# Patient Record
Sex: Female | Born: 1935 | Race: White | Hispanic: No | Smoking: Former smoker
Health system: Southern US, Community
[De-identification: ages and names within clinical notes are randomized; demographics above are authoritative.]

## PROBLEM LIST (undated history)

## (undated) DIAGNOSIS — F410 Panic disorder [episodic paroxysmal anxiety] without agoraphobia: Secondary | ICD-10-CM

## (undated) DIAGNOSIS — G2581 Restless legs syndrome: Secondary | ICD-10-CM

## (undated) DIAGNOSIS — E079 Disorder of thyroid, unspecified: Secondary | ICD-10-CM

## (undated) DIAGNOSIS — I10 Essential (primary) hypertension: Secondary | ICD-10-CM

## (undated) DIAGNOSIS — I2510 Atherosclerotic heart disease of native coronary artery without angina pectoris: Secondary | ICD-10-CM

## (undated) DIAGNOSIS — E039 Hypothyroidism, unspecified: Secondary | ICD-10-CM

## (undated) DIAGNOSIS — E559 Vitamin D deficiency, unspecified: Secondary | ICD-10-CM

## (undated) HISTORY — DX: Hypothyroidism, unspecified: E03.9

## (undated) HISTORY — DX: Vitamin D deficiency, unspecified: E55.9

## (undated) HISTORY — DX: Restless legs syndrome: G25.81

## (undated) HISTORY — PX: JOINT REPLACEMENT: SHX530

---

## 1999-06-24 ENCOUNTER — Other Ambulatory Visit: Admission: RE | Admit: 1999-06-24 | Discharge: 1999-06-24 | Payer: Self-pay | Admitting: Obstetrics and Gynecology

## 1999-07-01 ENCOUNTER — Ambulatory Visit (HOSPITAL_COMMUNITY): Admission: RE | Admit: 1999-07-01 | Discharge: 1999-07-01 | Payer: Self-pay | Admitting: Obstetrics and Gynecology

## 1999-07-01 ENCOUNTER — Encounter: Payer: Self-pay | Admitting: Obstetrics and Gynecology

## 2000-05-24 ENCOUNTER — Observation Stay (HOSPITAL_COMMUNITY): Admission: RE | Admit: 2000-05-24 | Discharge: 2000-05-25 | Payer: Self-pay | Admitting: Urology

## 2000-07-18 ENCOUNTER — Encounter: Admission: RE | Admit: 2000-07-18 | Discharge: 2000-07-18 | Payer: Self-pay | Admitting: Orthopaedic Surgery

## 2000-08-02 ENCOUNTER — Encounter: Admission: RE | Admit: 2000-08-02 | Discharge: 2000-08-02 | Payer: Self-pay | Admitting: Orthopaedic Surgery

## 2000-08-13 ENCOUNTER — Emergency Department (HOSPITAL_COMMUNITY): Admission: EM | Admit: 2000-08-13 | Discharge: 2000-08-13 | Payer: Self-pay | Admitting: Emergency Medicine

## 2000-08-22 ENCOUNTER — Encounter: Admission: RE | Admit: 2000-08-22 | Discharge: 2000-08-22 | Payer: Self-pay | Admitting: Orthopaedic Surgery

## 2000-10-08 ENCOUNTER — Other Ambulatory Visit: Admission: RE | Admit: 2000-10-08 | Discharge: 2000-10-08 | Payer: Self-pay | Admitting: Obstetrics and Gynecology

## 2000-10-18 ENCOUNTER — Encounter: Admission: RE | Admit: 2000-10-18 | Discharge: 2000-10-18 | Payer: Self-pay | Admitting: Obstetrics and Gynecology

## 2000-10-18 ENCOUNTER — Encounter: Payer: Self-pay | Admitting: Obstetrics and Gynecology

## 2001-07-19 ENCOUNTER — Emergency Department (HOSPITAL_COMMUNITY): Admission: EM | Admit: 2001-07-19 | Discharge: 2001-07-19 | Payer: Self-pay | Admitting: Emergency Medicine

## 2001-07-19 ENCOUNTER — Encounter: Payer: Self-pay | Admitting: Emergency Medicine

## 2001-07-30 ENCOUNTER — Ambulatory Visit (HOSPITAL_COMMUNITY): Admission: RE | Admit: 2001-07-30 | Discharge: 2001-07-30 | Payer: Self-pay | Admitting: Internal Medicine

## 2001-09-04 ENCOUNTER — Inpatient Hospital Stay (HOSPITAL_COMMUNITY): Admission: RE | Admit: 2001-09-04 | Discharge: 2001-09-09 | Payer: Self-pay | Admitting: Orthopaedic Surgery

## 2002-03-04 ENCOUNTER — Other Ambulatory Visit: Admission: RE | Admit: 2002-03-04 | Discharge: 2002-03-04 | Payer: Self-pay | Admitting: Obstetrics and Gynecology

## 2003-04-08 ENCOUNTER — Ambulatory Visit (HOSPITAL_COMMUNITY): Admission: RE | Admit: 2003-04-08 | Discharge: 2003-04-08 | Payer: Self-pay | Admitting: Gastroenterology

## 2003-07-10 ENCOUNTER — Inpatient Hospital Stay (HOSPITAL_COMMUNITY): Admission: RE | Admit: 2003-07-10 | Discharge: 2003-07-12 | Payer: Self-pay | Admitting: Orthopaedic Surgery

## 2003-08-10 ENCOUNTER — Encounter: Admission: RE | Admit: 2003-08-10 | Discharge: 2003-09-01 | Payer: Self-pay | Admitting: *Deleted

## 2003-09-28 ENCOUNTER — Encounter: Admission: RE | Admit: 2003-09-28 | Discharge: 2003-09-28 | Payer: Self-pay | Admitting: Orthopaedic Surgery

## 2003-10-12 ENCOUNTER — Encounter: Admission: RE | Admit: 2003-10-12 | Discharge: 2003-10-12 | Payer: Self-pay | Admitting: Orthopaedic Surgery

## 2003-10-27 ENCOUNTER — Encounter: Admission: RE | Admit: 2003-10-27 | Discharge: 2003-10-27 | Payer: Self-pay | Admitting: Orthopaedic Surgery

## 2004-01-11 ENCOUNTER — Inpatient Hospital Stay (HOSPITAL_COMMUNITY): Admission: AD | Admit: 2004-01-11 | Discharge: 2004-01-15 | Payer: Self-pay | Admitting: Cardiology

## 2004-01-22 ENCOUNTER — Other Ambulatory Visit: Admission: RE | Admit: 2004-01-22 | Discharge: 2004-01-22 | Payer: Self-pay | Admitting: Obstetrics and Gynecology

## 2004-06-20 ENCOUNTER — Ambulatory Visit (HOSPITAL_COMMUNITY): Admission: RE | Admit: 2004-06-20 | Discharge: 2004-06-21 | Payer: Self-pay | Admitting: Orthopedic Surgery

## 2004-07-08 ENCOUNTER — Ambulatory Visit: Payer: Self-pay

## 2005-04-09 ENCOUNTER — Emergency Department (HOSPITAL_COMMUNITY): Admission: EM | Admit: 2005-04-09 | Discharge: 2005-04-09 | Payer: Self-pay | Admitting: Emergency Medicine

## 2006-04-30 ENCOUNTER — Encounter: Admission: RE | Admit: 2006-04-30 | Discharge: 2006-04-30 | Payer: Self-pay | Admitting: Orthopedic Surgery

## 2006-05-01 ENCOUNTER — Ambulatory Visit (HOSPITAL_BASED_OUTPATIENT_CLINIC_OR_DEPARTMENT_OTHER): Admission: RE | Admit: 2006-05-01 | Discharge: 2006-05-01 | Payer: Self-pay | Admitting: Orthopedic Surgery

## 2006-08-07 ENCOUNTER — Encounter: Admission: RE | Admit: 2006-08-07 | Discharge: 2006-08-07 | Payer: Self-pay | Admitting: Internal Medicine

## 2006-11-18 ENCOUNTER — Emergency Department: Payer: Self-pay | Admitting: Unknown Physician Specialty

## 2007-02-01 ENCOUNTER — Encounter: Admission: RE | Admit: 2007-02-01 | Discharge: 2007-02-01 | Payer: Self-pay | Admitting: Internal Medicine

## 2007-03-18 ENCOUNTER — Ambulatory Visit (HOSPITAL_COMMUNITY): Admission: RE | Admit: 2007-03-18 | Discharge: 2007-03-18 | Payer: Self-pay | Admitting: Cardiology

## 2007-04-01 ENCOUNTER — Inpatient Hospital Stay (HOSPITAL_COMMUNITY): Admission: AD | Admit: 2007-04-01 | Discharge: 2007-04-04 | Payer: Self-pay | Admitting: Cardiology

## 2007-05-15 ENCOUNTER — Ambulatory Visit (HOSPITAL_COMMUNITY): Admission: RE | Admit: 2007-05-15 | Discharge: 2007-05-15 | Payer: Self-pay | Admitting: Cardiology

## 2007-10-03 ENCOUNTER — Encounter: Admission: RE | Admit: 2007-10-03 | Discharge: 2007-10-03 | Payer: Self-pay | Admitting: Internal Medicine

## 2007-12-31 ENCOUNTER — Encounter: Admission: RE | Admit: 2007-12-31 | Discharge: 2007-12-31 | Payer: Self-pay | Admitting: Cardiology

## 2008-01-30 ENCOUNTER — Encounter (INDEPENDENT_AMBULATORY_CARE_PROVIDER_SITE_OTHER): Payer: Self-pay | Admitting: *Deleted

## 2008-01-30 ENCOUNTER — Ambulatory Visit (HOSPITAL_COMMUNITY): Admission: RE | Admit: 2008-01-30 | Discharge: 2008-01-30 | Payer: Self-pay | Admitting: *Deleted

## 2008-11-13 ENCOUNTER — Emergency Department (HOSPITAL_COMMUNITY): Admission: EM | Admit: 2008-11-13 | Discharge: 2008-11-13 | Payer: Self-pay | Admitting: Emergency Medicine

## 2008-12-16 ENCOUNTER — Encounter: Admission: RE | Admit: 2008-12-16 | Discharge: 2008-12-16 | Payer: Self-pay | Admitting: Internal Medicine

## 2009-10-26 ENCOUNTER — Encounter: Admission: RE | Admit: 2009-10-26 | Discharge: 2009-10-26 | Payer: Self-pay | Admitting: Internal Medicine

## 2010-04-11 ENCOUNTER — Other Ambulatory Visit: Payer: Self-pay | Admitting: Internal Medicine

## 2010-04-11 ENCOUNTER — Ambulatory Visit
Admission: RE | Admit: 2010-04-11 | Discharge: 2010-04-11 | Disposition: A | Discharge status: Home / Self Care | Payer: Medicare Other | Source: Ambulatory Visit | Attending: Internal Medicine | Admitting: Internal Medicine

## 2010-04-11 DIAGNOSIS — R05 Cough: Secondary | ICD-10-CM

## 2010-05-19 LAB — PROTIME-INR: INR: 2.4 — ABNORMAL HIGH (ref 0.00–1.49)

## 2010-05-26 ENCOUNTER — Emergency Department (HOSPITAL_COMMUNITY)
Admission: EM | Admit: 2010-05-26 | Discharge: 2010-05-26 | Disposition: A | Discharge status: Home / Self Care | Payer: Medicare Other | Attending: Emergency Medicine | Admitting: Emergency Medicine

## 2010-05-26 ENCOUNTER — Emergency Department (HOSPITAL_COMMUNITY): Payer: Medicare Other

## 2010-05-26 DIAGNOSIS — I10 Essential (primary) hypertension: Secondary | ICD-10-CM | POA: Insufficient documentation

## 2010-05-26 DIAGNOSIS — I509 Heart failure, unspecified: Secondary | ICD-10-CM | POA: Insufficient documentation

## 2010-05-26 DIAGNOSIS — M79609 Pain in unspecified limb: Secondary | ICD-10-CM

## 2010-05-26 DIAGNOSIS — M25569 Pain in unspecified knee: Secondary | ICD-10-CM | POA: Insufficient documentation

## 2010-05-26 DIAGNOSIS — M549 Dorsalgia, unspecified: Secondary | ICD-10-CM | POA: Insufficient documentation

## 2010-05-26 DIAGNOSIS — E785 Hyperlipidemia, unspecified: Secondary | ICD-10-CM | POA: Insufficient documentation

## 2010-05-26 DIAGNOSIS — E669 Obesity, unspecified: Secondary | ICD-10-CM | POA: Insufficient documentation

## 2010-05-26 DIAGNOSIS — E039 Hypothyroidism, unspecified: Secondary | ICD-10-CM | POA: Insufficient documentation

## 2010-05-26 DIAGNOSIS — M25469 Effusion, unspecified knee: Secondary | ICD-10-CM | POA: Insufficient documentation

## 2010-05-26 DIAGNOSIS — R6883 Chills (without fever): Secondary | ICD-10-CM | POA: Insufficient documentation

## 2010-05-26 LAB — LACTATE DEHYDROGENASE, PLEURAL OR PERITONEAL FLUID: LD, Fluid: 995 U/L — ABNORMAL HIGH (ref 3–23)

## 2010-05-26 LAB — PROTEIN, BODY FLUID

## 2010-05-30 LAB — BODY FLUID CULTURE

## 2010-06-13 ENCOUNTER — Emergency Department (HOSPITAL_COMMUNITY)
Admission: EM | Admit: 2010-06-13 | Discharge: 2010-06-13 | Disposition: A | Discharge status: Home / Self Care | Payer: Medicare Other | Attending: Emergency Medicine | Admitting: Emergency Medicine

## 2010-06-13 DIAGNOSIS — M25469 Effusion, unspecified knee: Secondary | ICD-10-CM | POA: Insufficient documentation

## 2010-06-13 DIAGNOSIS — E785 Hyperlipidemia, unspecified: Secondary | ICD-10-CM | POA: Insufficient documentation

## 2010-06-13 DIAGNOSIS — Z96659 Presence of unspecified artificial knee joint: Secondary | ICD-10-CM | POA: Insufficient documentation

## 2010-06-13 DIAGNOSIS — Z79899 Other long term (current) drug therapy: Secondary | ICD-10-CM | POA: Insufficient documentation

## 2010-06-13 DIAGNOSIS — E039 Hypothyroidism, unspecified: Secondary | ICD-10-CM | POA: Insufficient documentation

## 2010-06-13 DIAGNOSIS — I10 Essential (primary) hypertension: Secondary | ICD-10-CM | POA: Insufficient documentation

## 2010-06-13 DIAGNOSIS — M25569 Pain in unspecified knee: Secondary | ICD-10-CM | POA: Insufficient documentation

## 2010-06-13 LAB — CBC
HCT: 36.9 % (ref 36.0–46.0)
Hemoglobin: 12.6 g/dL (ref 12.0–15.0)
MCH: 30.7 pg (ref 26.0–34.0)
MCHC: 34.1 g/dL (ref 30.0–36.0)
MCV: 90 fL (ref 78.0–100.0)
Platelets: 430 K/uL — ABNORMAL HIGH (ref 150–400)
RBC: 4.1 MIL/uL (ref 3.87–5.11)
RDW: 13.8 % (ref 11.5–15.5)
WBC: 7.9 K/uL (ref 4.0–10.5)

## 2010-06-13 LAB — DIFFERENTIAL
Basophils Absolute: 0.1 10*3/uL (ref 0.0–0.1)
Basophils Relative: 1 % (ref 0–1)
Eosinophils Absolute: 0.1 10*3/uL (ref 0.0–0.7)
Eosinophils Relative: 1 % (ref 0–5)
Lymphocytes Relative: 26 % (ref 12–46)
Lymphs Abs: 2.1 K/uL (ref 0.7–4.0)
Monocytes Absolute: 1 10*3/uL (ref 0.1–1.0)
Monocytes Relative: 13 % — ABNORMAL HIGH (ref 3–12)
Neutro Abs: 4.7 K/uL (ref 1.7–7.7)
Neutrophils Relative %: 59 % (ref 43–77)

## 2010-06-13 LAB — PROTIME-INR
INR: 2.96 — ABNORMAL HIGH (ref 0.00–1.49)
Prothrombin Time: 30.9 seconds — ABNORMAL HIGH (ref 11.6–15.2)

## 2010-06-28 NOTE — Op Note (Signed)
NAMEDAMIEN, [name]                 ACCOUNT NO.:  [id number]   MEDICAL RECORD NO.:  [id number]          PATIENT TYPE:  OIB   LOCATION:  2854                         FACILITY:  [facility]   PHYSICIAN:  Georga Hacking, M.D.DATE OF BIRTH:  August 25, 1927   DATE OF PROCEDURE:  05/15/2007  DATE OF DISCHARGE:                               OPERATIVE REPORT   NOTE:  Cardioversion.   REASON FOR CARDIOVERSION:  Recurrent atrial fibrillation.  She has been  maintained on amiodarone recently as preloading.   DESCRIPTION OF PROCEDURE:  The patient was brought to the short stay  unit, was n.p.o. after midnight.  Her INR was 2.5.  Cardioversion was  done with synchronized biphasic defibrillation with 120 watts resulting  in reversion to sinus bradycardia.  EKG confirmed sinus rhythm.   IMPRESSION:  Successful cardioversion of atrial fibrillation.      Georga Hacking, M.D.  Electronically Signed     WST/MEDQ  D:  05/15/2007  T:  05/15/2007  Job:  [id number]   cc:   Larina Earthly, M.D.

## 2010-06-28 NOTE — Discharge Summary (Signed)
Leah [name], Leah [name]                 ACCOUNT NO.:  [id number]   MEDICAL RECORD NO.:  [id number]          PATIENT TYPE:  INP   LOCATION:  2006                         FACILITY:  [facility]   PHYSICIAN:  Georga Hacking, M.D.DATE OF BIRTH:  October 04, 1927   DATE OF ADMISSION:  04/01/2007  DATE OF DISCHARGE:  04/04/2007                               DISCHARGE SUMMARY   FINAL DIAGNOSES:  1. Persistent atrial fibrillation.  2. Coumadin anticoagulation.  3. Hypertensive heart disease without heart failure.  4. Mitral insufficiency.  5. Hypothyroidism.  6. Obesity.  7. Severe osteoarthritis.  8. Asthma.  9. Hyperlipidemia.   HISTORY:  The patient is a 77 year old female who has a history of  episodic atrial fibrillation.  She was previously maintained in sinus  rhythm on Tikosyn and was recently converted to atrial fibrillation.  She developed worsening shortness of breath and fatigue, and we did a  cardioversion on her several weeks ago. She was transiently in sinus  rhythm following this but reverted back to atrial fibrillation.  Her  Tikosyn was stopped, and she has been off of it for 2 weeks.  She was  admitted to the hospital to initiate amiodarone under monitoring and  after she has achieved amiodarone loading she will have cardioversion.  Please see the previously typed history and physical for remainder of  the details.   HOSPITAL COURSE:  On admission her hemoglobin is 13.6, hematocrit 39.2,  platelet count is 236,000.  Her sodium is 139, potassium 3.5, chloride  99, CO2 31, BUN 11, creatinine 0.82, glucose 143.  Pro time INR was 2.2  and had risen to 3.2 on the day of discharge, possibly due to  amiodarone. An EKG showed atrial fibrillation.  Chest x-ray showed COPD.  Pulmonary function tests showed an FVC of 3.2, FEV-1 of 1.84, diffusing  capacity of 17.12 which was 66% of predicted. She was thought to have  mild peripheral airways disease. She was given amiodarone 400 mg twice  daily and tolerated this. However, remained in atrial fibrillation.  She  had slight baseline QT prolongation which did not change. She tolerated  it well and was discharged improved condition.   She is to be on Lipitor 10 mg daily, guaifenesin 2 mg daily, hydrocodone  5/500 mg 1-2 tablets every 6 hours as needed, levothyroxine 0.112 mg  daily, amlodipine 5 mg daily, Avapro 300 mg daily, amiodarone 200 mg  twice daily, furosemide 80 mg daily, clubbing 0.3 mg b.i.d. Cymbalta 60  mg daily.  Alprazolam 0.5 mg as needed, Coumadin 2.5 mg daily except  none on Friday.   She is to see me in follow-up in 2 weeks with a repeat Coumadin check.  We will consider cardioversion after she has been on amiodarone for a  month.      Georga Hacking, M.D.  Electronically Signed     WST/MEDQ  D:  04/04/2007  T:  04/05/2007  Job:  [id number]

## 2010-06-28 NOTE — Op Note (Signed)
NAMEMICHALA, Leah [name]                 ACCOUNT NO.:  [id number]   MEDICAL RECORD NO.:  [id number]          PATIENT TYPE:  OIB   LOCATION:  2853                         FACILITY:  [facility]   PHYSICIAN:  Georga Hacking, M.D.DATE OF BIRTH:  08/02/1927   DATE OF PROCEDURE:  03/18/2007  DATE OF DISCHARGE:                               OPERATIVE REPORT   INDICATIONS FOR PROCEDURE:  Symptomatic 77 year old female with atrial  fibrillation with previous reversion to sinus rhythm.  She has had  recurrent atrial fibrillation that has been symptomatic.  She was  maintained on Warfarin with an INR of greater than 2 for the past month.   PROCEDURE:  Synchronized biphasic cardioversion.   DESCRIPTION OF PROCEDURE:  The patient was NPO after midnight and  brought to the short stay center.  She had anesthesia administered by Germaine Pomfret, M.D. with 125 mg of Pentothal.  Cardioversion was done  with synchronized biphasic defibrillation with 120 watts resulting in  reversion to sinus rhythm with occasional PAC's.   IMPRESSION:  Successful cardioversion of atrial fibrillation.      Georga Hacking, M.D.  Electronically Signed     WST/MEDQ  D:  03/18/2007  T:  03/18/2007  Job:  [id number]   cc:   Larina Earthly, M.D.

## 2010-07-01 NOTE — H&P (Signed)
[place]. [facility]  Patient:    Leah [name], Leah [name] Visit Number: [id number] MRN: [id number]          Service Type: SUR Location: [id number] Attending Physician:  Jacki Cones Dictated by:   Genene Churn. Barry Dienes, P.A.-C. Admit Date:  09/04/2001                           History and Physical  CHIEF COMPLAINT:  Left knee pain.  HISTORY OF PRESENT ILLNESS:  A 77 year old white female with a history of left knee osteoarthritis and chronic pain, who presents for a preop evaluation for a left total knee arthroplasty.  She has had progressively worsening symptoms along with catching.  No response to conservative treatment.  Significant decrease in her daily activity level due to the ongoing complaints.  MEDICATIONS:  1. Atenolol 100 mg p.o. q.d.  2. Norvasc 5 mg p.o. q.d.  3. Zoloft 50 mg p.o. q.d.  4. Avapro 300 mg p.o. q.d.  5. Synthroid 0.125 mg p.o. q.d.  6. Furosemide 80 mg p.o. q.d.  7. Guaifenesin 2 mg p.o. q.d.  8. Clonidine 0.1 mg p.o. q.d.  9. Alprazolam 0.5 mg p.o. q.h.s. 10. Advair inhaler p.r.n. 11. Albuterol inhaler p.r.n.  ALLERGIES:  CODEINE, BENADRYL.  PAST SURGICAL HISTORY:  1. Back surgery 50+ years ago.  2. Open cholecystectomy in 1969.  3. Lipoma excision x10 years ago.  4. Bladder sling procedure June 2002.  5. Left hammer toe surgery.  6. Tubal ligation in 1965.  FAMILY HISTORY:  Positive for hypertension.  SOCIAL HISTORY:  The patient denies smoking or alcohol consumption.  She is married.  REVIEW OF SYSTEMS:  Currently the patient denies any chest pain, shortness of breath, or abdominal pain.  Denies nausea, vomiting, fever, chills, night sweats, bleeding disorders, seizure disorders.  Does have cardiomegaly and hypothyroidism.  History of hypertension.  PHYSICAL EXAMINATION:  VITAL SIGNS:  Temperature 98.8, pulse 54, respirations 12, blood pressure 134/84.  Height 5 feet 5 inches, weight 230 pounds.  GENERAL:  A  pleasant, elderly white female who is alert and oriented, in no acute distress.  HEENT:  Head is normocephalic, atraumatic.  PERRLA and EOMI.  Upper and lower dentures intact.  Mucosa pink and moist.  NECK:  Full range of motion.  Supple and nontender.  Carotid pulses are palpable and intact.  No lymphadenopathy.  CHEST:  Lungs CTA bilaterally.  No wheezes, rales, or rhonchi noted.  BREASTS:  Not examined.  CARDIAC:  RR, bradycardic.  S1, S2.  No murmurs, rubs, or gallops noted.  ABDOMEN:  Round and protuberant.  NABS x4.  Soft, nontender.  No palpable masses or organomegaly.  EXTREMITIES:  On examination of the left knee, range of motion 0-95 degrees. The patient walks with a limp.  Positive medial and lateral joint line tenderness.  Swelling is noted with a small palpable effusion.  Cruciate and collateral ligaments are intact.  Sensory and motor function intact.  Pedal pulses intact.  SKIN:  Warm and dry.  LABORATORY DATA:  Previous x-ray showed left knee with tricompartmental joint space narrowing with marginal osteophyte formation.  ASSESSMENT:  Left knee osteoarthritis.  PLAN:  Left total knee arthroplasty.  Operative versus nonoperative treatments discussed with the patient.  The possible risks and complications such as bleeding, infection, nerve damage, stiffness, DVT, PE, fractures, transfusion, anesthesia complications.  All questions answered, and she wishes to proceed with the surgery  at this time. Dictated by:   Genene Churn. Barry Dienes, P.A.-C. Attending Physician:  Jacki Cones DD:  09/04/01 TD:  09/08/01 Job: [id number]

## 2010-07-01 NOTE — Op Note (Signed)
NAME:  Leah [name], Leah [name]                           ACCOUNT NO.:  [id number]   MEDICAL RECORD NO.:  [id number]                   PATIENT TYPE:  AMB   LOCATION:  ENDO                                 FACILITY:  [facility]   PHYSICIAN:  Petra Kuba, M.D.                 DATE OF BIRTH:  05/30/1927   DATE OF PROCEDURE:  04/08/2003  DATE OF DISCHARGE:                                 OPERATIVE REPORT   PROCEDURE PERFORMED:  Colonoscopy.   ENDOSCOPIST:  Petra Kuba, M.D.   INDICATIONS FOR PROCEDURE:  Patient with chronic constipation, family  history of colon polyps, due for colon screening.  Consent was signed after  the risks, benefits, methods and options were thoroughly discussed in the  office.   MEDICINES USED:  Demerol 100 mg, Versed 10 mg.   DESCRIPTION OF PROCEDURE:  Rectal inspection was pertinent for small  external hemorrhoids.  Digital exam was negative.  A video pediatric  adjustable colonoscope was inserted and with lots of difficulty due to a  long looping colon which required rolling her on her back, on her right  side, all the way back to her left side and then back to her right side with  multiple abdominal pressures, we were finally able to advance to the cecum.  There was a rare left-sided diverticulum seen on insertion but no other  abnormality.  The prep was adequate.  There was some liquid stool that  required washing and suctioning.  On slow withdrawal through the colon, we  did occasionally fall back past the loop where we could not readvance to  double check behind the folds but for the most part when we did fall back,  we did try to readvance.  On slow withdrawal through the colon other than  the rare left-sided diverticula, no abnormalities were seen.  Anorectal  pullthrough and retroflexion back in the rectum revealed some small  hemorrhoids.  Scope was straightened and readvanced a short ways up the left  side of the colon, air was suctioned, scope removed.  The  patient tolerated  the procedure well.  There was no immediate obvious complication.   ENDOSCOPIC DIAGNOSIS:  1. Internal and external hemorrhoids.  2. Rare left-sided diverticula.  3. Tortuous long looping colon.  4. Otherwise within normal limits to the cecum.   PLAN:  Yearly rectals and guaiacs per Dr. Felipa Eth.  Screening as needed.  Consider either a one time barium enema or virtual colonoscopy if widely  available at that time.  Might want to also try the regular scope in the  future and Happy to see back p.r.n.  Otherwise return care to Dr. Felipa Eth for  the customary health care maintenance.  Petra Kuba, M.D.    MEM/MEDQ  D:  04/08/2003  T:  04/09/2003  Job:  [id number]   cc:   Larina Earthly, M.D.  [address]  [place]  [place] [address]  Fax: [phone]

## 2010-07-01 NOTE — Op Note (Signed)
Leah [name], Leah [name]                 ACCOUNT NO.:  [id number]   MEDICAL RECORD NO.:  [id number]          PATIENT TYPE:  OIB   LOCATION:  2899                         FACILITY:  [facility]   PHYSICIAN:  Leonides Grills, M.D.     DATE OF BIRTH:  05/30/1927   DATE OF PROCEDURE:  06/20/2004  DATE OF DISCHARGE:                                 OPERATIVE REPORT   PREOPERATIVE DIAGNOSES:  1. Right hypermobile first ray.  2. Right hallux valgus.  3. Right second hammer toe.     POSTOPERATIVE DIAGNOSES:  1. Right hypermobile first ray.  2. Right hallux valgus.  3. Right second hammer toe.     OPERATION:  1. Right first tarsometatarsal joint fusion with osteotomy.  2. Right local bone graft.  3. Right modified McBride bunionectomy.  4. Stress x-rays, right foot.  5. Right second toe metatarsophalangeal joint dorsal capsulotomy with      collateral release.  6. Right second toe proximal phalanx head resection.  7. Right second toe flexor digitorum longus to proximal phalanx transfer.  8. Right second toe extensor digitorum brevis to extensor digitorum longus      transfer.     ANESTHESIA:  General endotracheal tube with popliteal block.   SURGEON:  Leonides Grills, M.D.   ASSISTANT:  Lianne Cure, P.A.   ESTIMATED BLOOD LOSS:  Minimal.   TOURNIQUET TIME:  Approximately two hours.   COMPLICATIONS:  None.   DISPOSITION:  Stable to the PAR.   INDICATIONS:  This is a 77 year old female who has had longstanding right  forefoot pain that was interfering with her life to the point that she could  not do what she wants to do.  She was consented for the above procedure.  All risks, which include infection, nerve or vessel injury, nonunion,  malunion, hardware rotation, hardware failure, persistent pain, worse pain,  stiffness, arthritis, recurrence of deformity, prolonged recovery, were all  explained, and questions were encouraged and answered.   OPERATION:  The patient was brought to  the operating room and placed in the  supine position after adequate general endotracheal tube anesthesia was  administered as well as Ancef 1 g IV piggyback.  The right lower extremity  was then prepped and draped in a sterile manner over a proximally-placed  thigh tourniquet.  The limb was gravity-exsanguinated and the tourniquet was  elevated to 290 mmHg.  A longitudinal incision just lateral to the EHL  tendon was then made.  Dissection was carried down through the skin and  hemostasis obtained.  This was extended all the way to the lateral aspect of  the first MTP joint.  Dissection was carried down through skin, hemostasis  obtained, the neurovascular structures were identified and retracted  laterally.  The interval between the EHL and EHB was then developed and the  base of the first metatarsal dorsally was skeletonized and the first TMT  joint was ten entered.  A closing wedge-type flexion varus osteotomy was  then made into the base of the medial cuneiform with the sagittal saw.  This  bone was then  removed and placed on the back table for later local bone  graft.  Remaining cartilage on the base of the first metatarsal was then  removed with the curved quarter-inch osteotome and multiple 2 mm drill holes  were placed in the base of the first metatarsal as well.  This bone was very  soft.  After this was done, we then dissected distally through the dorsal  wound through a separate approach to the lateral aspect of the first MTP  joint.  The lateral capsule was then released with a scalpel.  The adductor  tendon was released as well.  Soft tissues were released from the lateral  aspect of the lateral sesamoid slightly as well.  We then made a  longitudinal incision over the medial aspect of the great toe MTP joint.  Dissection was carried down through skin.  Hemostasis was obtained.  Neurovascular structures were identified both superiorly and inferiorly and  protected out of  harm's way.  An L-shaped capsulotomy was then made.  A  simple bunionectomy was then performed with a sagittal saw.  Rocky Link Johnson's  ridge was then rounded off with a rongeur.  The area was copiously irrigated  with normal saline.  The lateral capsule was verified to be released through  the medial joint, which has completed the modified McBride bunionectomy.  We  then went back to the first TMT joint area with a two-point reduction clamp.  The joint was then anatomically reduced to the new bone cuts.  Once this was  done, a bur was used to create a notch at the base of the first metatarsal.  As well, a 4.0 mm fully-threaded cortical Synthes stainless steel screw was  then placed using 4.0 and 2.9 mm drill holes, respectively.  The bone was  extremely soft dorsally, and there was a small crack that developed as well  despite creating a notch.  We then placed a second screw from the medial  cuneiform into the base of the first metatarsal using 4.0 and 2.9 mm drill  holes, respectively, and this was countersunk and this had better purchase  and maintenance of the correction.  The reduction clamps were removed.  Local bone graft was obtained from the drill bits as well.  We then made a  longitudinal incision over the second toe.  Dissection was carried down  through skin.  Hemostasis was obtained.  The extensor digitorum longus and  brevis tendons were identified and the brevis tenotomized distal lateral and  the longus tenotomized proximal medial and retracted out of harm's way for  later transfer.  A dorsal MTP joint capsulotomy with collateral release was  then performed.  The distal aspect of the proximal phalanx was sthen  skeletonized once the PIP joint was entered, and the head was then removed  with a rongeur followed by a bone cutter.  WE then made a longitudinal  incision in the plantar plate.  The FDL tendon was then identified and then tenotomized as distal as possible for later  transfer.  A 3.5 mm drill hole  was then placed in the base of the proximal phalanx.  The FDL tendon was  then pulled through the drill hole using 3-0 PDS suture.  This had an  outstanding transfer of the FDL to the proximal phalanx.  We then placed a  double-ended trocar, 0.045 K-wire antegrade through the middle and distal  phalanx, reduced the PIP joint, and then fired this retrograde.  The toe was  then held in an anatomic position.  Tension was placed on the FDL tendon,  and the K-wire was then fired across the MTP joint.  This held the toe in an  excellent position.  We then completed the transfer dorsally of the EDB to  EDL using 3-0 PDS suture and transferred this to the stump of the FDL  tendon.  This had an Conservation officer, historic buildings.  The tourniquet was deflated,  hemostasis obtained.  The medial capsule of the great toe MTP joint was then  repaired with the toe held in a supinated position, abducted position, using  2-0 Vicryl suture.  This had an Conservation officer, historic buildings.  The capsule was  advanced both superiorly and proximally.  Final stress x-rays were obtained  in the AP, lateral and oblique planes.  This showed that the toe was in  excellent alignment and there was no gross motion across the arthrodesis  site as well.  We then obtained local bone graft from not only the proximal  phalanx head wedge that was taken as well as from the drill bits.  This was  all morcellized and packed around the first TMT joint using stress-strain  relieving bone graft and bur after the area was copiously irrigated with  normal saline.  The tourniquet was deflated, hemostasis was obtained.  The  subcu was closed with 3-0 Vicryl, the skin was closed with 4-0 nylon.  Skin-  relieving __________ were made on either side of the K-wire.  The K-wires  were then cut and capped.  The wounds were closed with 4-0 nylon sutures, a  sterile dressing was applied.  A modified Jones dressing was applied with  the ankle  in neutral dorsiflexion.  The patient was stable to the PAR.      PB/MEDQ  D:  06/20/2004  T:  06/20/2004  Job:  [id number]

## 2010-07-01 NOTE — Op Note (Signed)
[place]. [facility]  Patient:    Leah [name], Leah [name] Visit Number: [id number] MRN: [id number]          Service Type: SUR Location: [phone] Attending Physician:  Jacki Cones Dictated by:   Veverly Fells Ophelia Charter, M.D. Proc. Date: 09/04/01 Admit Date:  09/04/2001                             Operative Report  PREOPERATIVE DIAGNOSIS:  Left knee osteoarthritis.  POSTOPERATIVE DIAGNOSIS:  Left knee osteoarthritis.  PROCEDURE:  Left total knee arthroplasty, cement tibia and patella, Press-Fit femur.  SURGEON:  Mark C. Ophelia Charter, M.D.  ASSISTANT:  Zonia Kief, P.A.-C.  ANESTHESIA:  General.  TOURNIQUET TIME:  One hour and 8 minutes.  COMPONENTS USED:  Osteonics Scorpio #7 Press-Fit femur and #7 cemented tibia, 12 mm bearing, #7 patella.  DESCRIPTION OF PROCEDURE:  After induction of general anesthesia and oropharynx intubation, and proximal thigh tourniquet application and preoperative Ancef prophylaxis, standard prepping and draping was performed. Usual impervious stockinette, Coban, and total arthroplasty sheets, drapes, sterile skin marker, and Betadine Vidrape was applied.  A midline incision was made after wrapping the leg with an Esmarch and tourniquet inflation.  The superficial retinaculum was reflected.  True retinaculum was opened along the medial side of the patella including the quad tendon between the medial one-third and the lateral two-thirds.  The patella was flipped over.  Large bone spurs were removed off of the femur.  With the patella everted, the knee was flexed.  Intermedullary alignment was used on the femur with an additional drill followed by distal cut.  An additional 2 mm were taken and more bony segment off the prominent medial femoral condyle and the lateral.  Sizing was appropriate for the #7 and there was some slight catching antrally. It was pinned for a #9 and cut for 7.  Chamfer cuts were made.  The tibia was sized and  was cut with intermedullary alignment.  The #7 was the appropriate size and trials were inserted after notch cuts were made for the Scorpio post system.  Rotation was marked.  Initially, a 10 was a little bit loose, and the next size up bearing which was the 12.5 was appropriate.  After resurfacing of the patella, removing 9.5 mm of bone.  A #7 size was selected for the patella as well.  Trials were inserted one last time. Rotation stability was checked after irrigation with pulsatile lavage with pickless drying.  The tibia was inserted.  Excessive cement was removed. Press-Fit femur was impacted.  A 12 mm permanent bearing was inserted.  The patella was inserted and clamped.  After 15 minutes, the cement was hardened.  The tourniquet was deflated and small bleeders were controlled with the Bovie electrocautery.  Meticulous hemostasis was obtained.  True retinaculum was closed with #1 Ethibond, a 2-0 Vicryl in the superficial retinaculum and subcutaneous tissue, skin staple closure, Marcaine infiltration in the skin and subcutaneous tissue and into the joint.  The patient tolerated the procedure well and was transferred to the recovery room in stable condition after postoperative dressing knee immobilizer was applied. Dictated by:   Veverly Fells Ophelia Charter, M.D. Attending Physician:  Jacki Cones DD:  09/04/01 TD:  09/07/01 Job: [id number]

## 2010-07-01 NOTE — Discharge Summary (Signed)
[name], Leah [name]                 ACCOUNT NO.:  [id number]   MEDICAL RECORD NO.:  [id number]          PATIENT TYPE:  INP   LOCATION:  3707                         FACILITY:  [facility]   PHYSICIAN:  Darden Palmer., M.D.DATE OF BIRTH:  05/30/1927   DATE OF ADMISSION:  01/11/2004  DATE OF DISCHARGE:  01/15/2004                                 DISCHARGE SUMMARY   FINAL DIAGNOSES:  1.  Atrial fibrillation.  2.  Hypertensive heart disease.  3.  Coumadin anticoagulation.  4.  History of asthma.  5.  Obesity.   HISTORY:  The patient is a 77 year old woman, admitted to the hospital to  initiate treatment for atrial fibrillation with dofetilide. Please see the  previously dictated history and physical for the remainder of the details.   HOSPITAL COURSE:  On admission, the patient had a normal CBC. Prothrombin  INR was 2.2 on admission. Chemistry profile showed a potassium of 4.3,  glucose 108, BUN 10, and creatinine 0.8; magnesium was 2.1. EKG showed  atrial fibrillation on admission.   The patient was admitted to the hospital to initiate therapy with Tikosyn.  The Tikosyn was initiated and she had sinus rhythm with some PACs, but had  some bradycardia and the beta blocker was reduced. She continued to have  some sinus bradycardia and the beta blocker was discontinued because of the  bradycardia.   Her Tikosyn was initiated and she converted to sinus rhythm without the need  for cardioversion. She was given appropriate inpatient education for Tikosyn  and precautions were discussed with her.   DISPOSITION:  She is discharged at this time in improved condition.   DISCHARGE MEDICATIONS:  1.  Tikosyn 125 mg b.i.d.  2.  Advair Diskus.  3.  Avapro 300 mg.  4.  Clonidine 0.1 mg b.i.d.  5.  Coumadin to resume previous dose.  6.  Cymbalta 60 mg daily.  7.  Darvocet-N 100 as needed.  8.  Lasix 80 mg.  9.  Lipitor 10 mg.  10. Norvasc 5 mg daily.  11. Synthroid 0.112 mg daily.  12.  Tenex 2 mg daily.  13. Wellbutrin SR 150 mg daily.   DISCHARGE FOLLOW-UP:  She is to follow up in one week.   DISCHARGE SPECIAL INSTRUCTIONS:  She is to report any problems or  complications with the Tikosyn.      WST/MEDQ  D:  03/02/2004  T:  03/02/2004  Job:  [id number]   cc:   Larina Earthly, M.D.  [address]  [place]  [place] [address]  Fax: [phone]

## 2010-07-01 NOTE — Op Note (Signed)
[facility]  Patient:    Leah [name], Leah [name]                        MRN: [id number] Proc. Date: 05/24/00 Adm. Date:  [id number] Attending:  Evlyn Clines CC:         Darden Palmer., M.D.  Ravi R. Felipa Eth, M.D.   Operative Report  PROCEDURE:  Pubovaginal sling.  PREOPERATIVE DIAGNOSIS:  Stress incontinence.  POSTOPERATIVE DIAGNOSIS:  Stress incontinence.  SURGEON:  Excell Seltzer. Annabell Howells, M.D.  ANESTHESIA:  General.  DRAINS:  Suprapubic tube.  COMPLICATIONS:  None.  INDICATIONS:  The patient is a 77 year old white female with type 3 stress incontinence who has elected to undergo a sling procedure.  FINDINGS AND DESCRIPTION OF PROCEDURE:  The patient was taken to the operating room where a general anesthetic was induced.  She had been given p.o. Tequin preoperatively.  She was fitted with thigh TED and PAS hose.  She was placed in the lithotomy position and ________.  Her lower abdomen and genitalia were prepped with Betadine solution, and she was draped in the usual sterile fashion.  A Foley catheter was inserted and the bladder was drained.  A transverse incision was made just superior to the pubis in the midline, measuring approximately 3 cm in length.  The fat was then spread down to the fascia and an antibiotic soaked sponge was placed in the wound.  The anterior vaginal wall was then infiltrated with 10 cc of 1% lidocaine with epinephrine.  A midline anterior incision was made over the bladder neck and urethra using the knife.  The mucosa was then elevated off the pubourethral fascia on each side out to _________ to the pubis.  The pubourethral fascia was then pierced initially on the right, allowing finger passage into the retropubic space.  This was then repeated on the left.  At this point, a 2 x 8 cm Dermatrix strip was prepared by soaking in antibiotic solution.  A #1 Prolene was placed through each end with a quadruple helical  stitch.  The Raz needle was then used in two passes, one on the right and one on the left under finger guidance to draw the suspension sutures into the abdominal incision.  Once the suspension sutures were in place, a sling was tacked over the proximal urethral bladder neck area using 2-0 Vicryl tacking sutures.  The anterior vaginal wall was then closed using a running locked 2-0 Vicryl.  Cystoscopy was then performed using the 22-French sheath and a 70 degree lens. Examination revealed no evidence of bladder injury.  The sling material was noted to be in good position.  The bladder was filled.  The patient was placed in the Trendelenburg position and a microinvasive Fader-tip suprapubic tube was placed through a separate stab wound through the dome of the bladder.  The trocar was removed.  The _______ and secure.  The bladder was then partially drained.  At this point, the suspension sutures were then tied under minimal tension over a hemostat.  Once tied, they were tied across the midline to each other. The long ends were then trimmed.  The cystoscope sheath was placed back in the urethra and pulled down to ensure there was no over correction.  Pressure on the bladder allowed urine to still leak as is appropriate during a surgical procedure.  At this point, the abdominal incision was irrigated with antibiotic solution, and closed with an intracuticular  4-0 Vicryl.  The suprapubic tube was secured with a silk suture and placed to straight drainage.  Steri-Strips were applied to the abdominal incision and a 2 inch Iodoform vaginal pack with Bacitracin ointment was then placed.  At this point, the patient was taken down from the lithotomy position, her anesthetic was reversed, and she was moved to the recovery room in stable condition.  There were no complications. DD:  05/24/00 TD:  05/24/00 Job: [id number]

## 2010-07-01 NOTE — Discharge Summary (Signed)
NAME:  Leah [name], Leah [name]                           ACCOUNT NO.:  [id number]   MEDICAL RECORD NO.:  [id number]                   PATIENT TYPE:  INP   LOCATION:  5020                                 FACILITY:  [facility]   PHYSICIAN:  Mark C. Ophelia Charter, M.D.                 DATE OF BIRTH:  05/30/1927   DATE OF ADMISSION:  07/10/2003  DATE OF DISCHARGE:  07/12/2003                                 DISCHARGE SUMMARY   ADMISSION DIAGNOSES:  Left total knee arthroplasty, postoperative laxity.   HISTORY OF PRESENT ILLNESS:  Leah [name] is a 77 year old female who is status post  left total knee arthroplasty done September 04, 2001.  Recently she has been  having trouble with her knee feeling loose and feeling like it is going to  give out.  Exam showed varus and valgus laxity in extended position and the  flexed position.  X-rays taken showed no loosening of the components.   PROCEDURES:  The patient underwent left total knee arthroplasty poly  exchange no Jul 10, 2003.  Surgeon was Annell Greening, M.D.  Assistant was  [facility], [facility]C.  The patient tolerated this procedure well; there  were no complications.  Please see operative note for details.   HOSPITAL COURSE:  The patient was Jul 10, 2003 and underwent a left total  knee arthroplasty poly exchange.  There were no complications.  Please see  operative note for details.   On Jul 11, 2003, the patient labs were as follow:  Hematocrit 38.5.  The  patient is afebrile.  Vital signs stable.  EHL and peroneal strength were  5/5.  She was using her CPM today.  Full weightbearing therapy was started.  She was restarted on Coumadin and given 7.5 mg.  On Jul 12, 2003, the  patient was progressing well.  She was up walking well in physical therapy.   Labs as follows:  Hemoglobin 12.2, hematocrit 36.0, INR 2.8.  She was  discharged home today in stable condition after doing the stairs in physical  therapy.   DISCHARGE MEDICATIONS:  1. Tylox _____ tablets p.o. q4-6h.  p.r.n. pain.  2. Coumadin per pharmacy protocol.  3. Resume home medications.   SPECIAL INSTRUCTIONS:  The patient will be getting home health physical  therapy and home health PT/INR blood draws.  She will continue on Coumadin 4  weeks postoperatively.  Staples to be taken out in 2-3 weeks.  She will  follow up with Dr. Annell Greening in two weeks for postoperative visit.  Continue progressive full weightbearing, ambulation and total knee  arthroplasty protocol exercises.   DISCHARGE CONDITION:  Stable.   DISPOSITION:  To home.   DISCHARGE DIAGNOSIS:  Status post left total knee arthroplasty poly  revision.      Sandrea Matte, P.A.  Mark C. Ophelia Charter, M.D.    JH/MEDQ  D:  08/10/2003  T:  08/10/2003  Job:  [id number]

## 2010-07-01 NOTE — Op Note (Signed)
Leah [name], Leah [name]                 ACCOUNT NO.:  [id number]   MEDICAL RECORD NO.:  [id number]          PATIENT TYPE:  AMB   LOCATION:  DSC                          FACILITY:  [facility]   PHYSICIAN:  Leonides Grills, M.D.     DATE OF BIRTH:  05/30/1927   DATE OF PROCEDURE:  05/01/2006  DATE OF DISCHARGE:                               OPERATIVE REPORT   PREOPERATIVE DIAGNOSIS:  Right hallux valgus to the right third hammer  toe.   POSTOPERATIVE DIAGNOSIS:  Right hallux valgus to the right third hammer  toe.   OPERATION:  1. Right Chevron bunionectomy to the right third toe MTP joint.  2. Dorsal capsulotomy with collateral release.  3. Right third toe proximal phalanx head resection.  4. Right third toe FDL to proximal phalanx transfer.  5. Right third toe EDB to EDL tendon transfer.   ANESTHESIA:  General.   SURGEON:  Leonides Grills, M.D.   ASSISTANT:  Evlyn Kanner, P.A.-C.   ESTIMATED BLOOD LOSS:  Minimal.   TOURNIQUET TIME:  Approximately an hour.   COMPLICATIONS:  None.   DISPOSITION:  Stable to PR.   INDICATIONS:  This is a 77 year old female who has had long standing  right forefoot pain as well as third hammer toe pain that was  interfering with her life to the point where she could not do what she  wanted to do.  She was consented for the above procedure.  All risks  including infection, neurovascular injury, nonunion, malunion, hardware  irritation, hardware failure, persistent pain, worsening pain, prolonged  recovery, stiffness, arthritis, the possibility of recurrence of  deformity, hallux varus, and a cock up toe deformity were all explained.  Questions were encouraged and answered.   DESCRIPTION OF PROCEDURE:  The patient was taken to the operating room  and placed in the supine position. After adequate general endotracheal  tube anesthesia was administered as well as Ancef 1 gram IV piggyback,  the right lower extremity was then prepped and draped in a  sterile  manner over a proximally placed thigh tourniquet.  The limb was gravity  exsanguinated and the tourniquet elevated to 290 mmHg. A longitudinal  incision in the midline over the medial aspect of the right great toe  MTP joint was then made.  Dissection was carried down through skin.  Hemostasis was obtained.  The neurovascular structures both superiorly  and inferiorly were identified and protect throughout the case.  An L-  shaped capsulotomy was then made.  There was a large rent in the capsule  that would later be repaired.  A simple bunionectomy was then performed  with a sagittal saw.  Iantha Fallen Johnson's ridge was then rounded off the  rongeur.   We then made another incision on the dorsal lateral aspect of the right  great toe MTP joint.  Dissection was carried down through skin.  Hemostasis was obtained.  The lateral capsule as well as the abductor  structures to the hallux were released and the sesamoids were mobilized  carefully.  We were then able to reduce the sesamoids anatomically with  tension on the medial capsule.  We then performed a Chevron osteotomy.  The center of the head was then mapped out.  The soft tissues were  elevated and protected both superiorly and inferiorly and a Chevron  osteotomy was then created with the sagittal saw.  The head was then  translated approximately 3-4 mm laterally and affixed with a 2 mm fully  threaded cortical set screw using 1.5 mm drill hole respectively.  This  had excellent purchase and maintenance of correction.  Redundant bone  medially was then trimmed off with the sagittal saw.  The area and joint  were copiously irrigated with normal saline.  The capsule was then  advanced both superiorly and proximally and reconstructed with 2-0  Vicryl stitches and had an outstanding repair.  Once again, the area was  copiously irrigated with normal saline, as well.  Stress x-rays were  obtained in the AP and lateral planes and showed  no gross motion across  the osteotomy site, fixation in the proper position, and excellent  alignment, as well.   We then made a longitudinal incision over the right third toe.  Dissection was carried down through skin.  Hemostasis was obtained.  EDB  and EDL tendons were identified and EDL was tenotomized proximal medial  and brevis distal lateral and retracted out of harm's way for later  transfer.  A dorsal capsulotomy with collateral release was then  performed protecting the medial and lateral neurovascular structures.  This had excellent release of the tight capsule.  This was done with a  15 blade scalpel.  We then skeletonized the distal aspect of the  proximal phalanx and the head was then removed with a rongeur followed  by a bone cutter.  This cut was made perpendicular to the long axis of  the proximal phalanx. Sequential drill holes of 2.5 and 3.5 mm holes  respectively were made in the base of the proximal phalanx for FDL  tendon transfer.  We then made a longitudinal incision in the plantar  plate, identified the FDL, and tenotomized this is distal as possible.  Then with a 3-0 PDS stitch, this was then pulled from plantar to dorsal  through the drill hole in the base of the proximal phalanx.  We then  placed a 0.026 K-wire double ended trocar antegrade through the middle  and distal phalanx, reduced the PIP, and fired this retrograde through  the proximal phalanx with the toe held in reduced position, tension on  the FDL tendon through the drill hole with the ankle in neutral  dorsiflexion, the K-wires were fired across the MTP joint.  This held  the toe in excellent position and held adequate tension on the FDL  tendon.  We then completed the EDB to EDL tendon transfer with 3-0 PDS  stitch dorsally and sewed this to the FDL tendon recreating the extensor  expansion.  The tourniquet was deflated, hemostasis was obtained, the toe pinked up  nicely.  Skin relieving  incisions were made on either side of the K-  wires, the K-wires were then cut and capped.  The area was copiously  irrigated with normal saline.  The skin was closed with 4-0 nylon stitch  over all wounds.  A sterile dressing was applied, [employer] dressing  was applied, a hard sole shoe was applied.  The patient was stable to  the PR.   POSTOP COURSE:  The patient will follow-up in two weeks.  At that time,  we will remove the dressing as well as the suture.  We will place a  silicone bunion pad between the first and second toe.  She is to remain  heel weight bearing for a total of six weeks and weight bearing as  tolerated in a hard sole shoe for an additional two more weeks.  We  anticipate removing the K-wires at six weeks postop, skin mobilization  is to start two weeks after the stitches are removed, and once the K-  wires are removed from the third toe, plantar flexion stretchy exercises  to commence. She is keep this elevated.     Leonides Grills, M.D.  Electronically Signed    PB/MEDQ  D:  05/01/2006  T:  05/01/2006  Job:  [id number]

## 2010-07-01 NOTE — Discharge Summary (Signed)
NAME:  Leah [name], Leah [name]                           ACCOUNT NO.:  [id number]   MEDICAL RECORD NO.:  [id number]                   PATIENT TYPE:  INP   LOCATION:  5041                                 FACILITY:  [facility]   PHYSICIAN:  Hadasa Gasner DICTATOR                    DATE OF BIRTH:  05/30/1927   DATE OF ADMISSION:  09/04/2001  DATE OF DISCHARGE:  09/09/2001                                 DISCHARGE SUMMARY   FINAL DIAGNOSES:  1. Status post left total knee arthroplasty.  2. Hypothyroidism.  3. Hypertension.  4. History of asthma.  5. Long term use of anticoagulants.   HISTORY OF PRESENT ILLNESS:  The patient is a 77 year old white female with  history of left knee osteoarthritis and chronic pain. She has had  progressive worsening of symptoms over the past several months with no  response to conservative treatment. Significant decrease in her daily  activity level due to the ongoing complaints. X-rays showed a left knee  tricompartmental joint space narrowing with marginal osteophyte formation  and subchondral sclerosis.   LABORATORY DATA:  WBC 6.5, hemoglobin and hematocrit 13.2 and 39.5.  Platelets 254, PT 13.4, INR 1.0, PTT 31, NA 139, potassium 4.2, chloride  104, CO2 27, and glucose 99. BUN 13, creatinine 0.9, calcium 10.0, albumin  4.2. Urinalysis showed moderate leukocyte esterase with few epithelials and  many bacteria.   HOSPITAL COURSE:  On September 04, 2001, the patient was taken to the OR at [facility] and a left total knee arthroplasty procedure was performed.   ANESTHESIA:  General local Marcaine used for postoperative pain.   ESTIMATED BLOOD LOSS:  200 cc.   COMPLICATIONS:  None. The patient was transferred to the orthopedic floor in  stable condition.   HOSPITAL COURSE:  On September 05, 2001 the patient had good pain control, vital  signs were stable and she was afebrile. PT was 14.4, INR 1.1, hemoglobin  11.4. Started on FeSO4 at 325 mg po twice a day with  meals. The patient seen  by PT and OT. On September 06, 2001 had good pain control and patient complained  of being diaphoretic with ambulation but states that she has always been  like this. Vital signs stable and she was afebrile. PT 20.9, INR 1.9, and  hemoglobin 10.9. Foley was discontinued and an in/out catheter ordered.  Morphine PCA pump discontinued. On September 07, 2001 INR was 3.0, hemoglobin  9.1. CPM 0 to 50 degrees. Vitals stable and she was afebrile. On September 08, 2001 the patient did stairs and she is 50% partial weight bearing. She is  ambulating short distances. Vital signs stable and she was afebrile. PT  26.8, INR 3.0, and hemoglobin 9.7. CPM 0-70 degrees. On September 09, 2001 the  patient was doing extremely well and she was found to be ready for discharge  to home.  PT 25.5, INR 2.8.   CONDITION ON DISCHARGE:  Stable and good.   DISPOSITION:  To home.   MEDICATIONS:  1. Tylox one to two tabs po every four to six hours as needed.  2. FeSO4 325 mg po twice a day with meals.  3. Colace 100 mg po twice a day.  4. Coumadin 1 mg po daily for three to four weeks.  5. Resume previous home medications.   INSTRUCTIONS:  The patient will work with home health PT five times a week  to increase ambulation, range of motion, and strengthening. Home health RN  to monitor PT and INR three times a week. She will follow-up in the office  in one week for staple removal and placement of steri-strips. If there are  any complications or problems before that time, she will notify us.                                               Renji Berwick DICTATOR    DD/MEDQ  D:  10/12/2001  T:  10/14/2001  Job:  [id number]

## 2010-07-01 NOTE — Op Note (Signed)
NAME:  Leah [name], Leah [name]                           ACCOUNT NO.:  [id number]   MEDICAL RECORD NO.:  [id number]                   PATIENT TYPE:  INP   LOCATION:  5020                                 FACILITY:  [facility]   PHYSICIAN:  Mark C. Ophelia Charter, M.D.                 DATE OF BIRTH:  05/30/1927   DATE OF PROCEDURE:  07/10/2003  DATE OF DISCHARGE:                                 OPERATIVE REPORT   PROCEDURE:  Right total knee arthroplasty revision of tibial component.   SURGEON:  Mark C. Ophelia Charter, M.D.   ASSISTANT:  Sandrea Matte, P.A.   ASSISTANT:  GOT.   ESTIMATED BLOOD LOSS:  Less than 200 mL.   PROCEDURE:  After the induction of general anesthesia, orotracheal  intubation, a proximal thigh tourniquet which was a 48 inch tourniquet, the  leg was prepped with Duraprep after a heel bump and a lateral post had been  applied.  The appropriate stockinette, Coban and total knee arthroplasty  sheets, drapes, sterile skin marker and Betadine and Vi-Drape were applied.  The old incision was opened which was a midline incision.  The superficial  retinaculum was divided and the deep retinaculum was divided cutting through  all the old sutures.  There was considerable soft tissue inferior to the  patella and this had to be debrided in order to mobilize the patella and to  flip it out of the way for exposure.  The patient had laxity and mild  hyperextension.  The posterior of the polyethylene was pristine and there  was some laxity in both flexion and extension  for varus and valgus.  The  poly was a 12 mm and was a Photographer.  It was removed with an  osteotome.  The femoral condyle and tibial component were tested and were  secure.  A 15 mm trial was inserted with some difficulty due to the  patient's large leg and the presence of the femoral component in place. A  narrow Hohmann was used in the popliteal region just behind the tibial  component with flexion of the knee pulling the tibia  forward. The 15 mm  improved the stability, but there was still some mild hyperextension and an  18 mm trial gave full extension and good varus and valgus without too much  tightness and no limitation of flexion. An 18 mm post was selected and after  irrigation the poly was placed with identical findings of improved  stability.  This was difficult to place, but it snapped in securely.  There  was full extension, full flexion, normal patellar traction and tracking the  patella was normal.  The true retinaculum was closed with #1 Ethibond  suture, 2-0 Vicryl in superficial, retinaculum and subcutaneous tissue, skin  staple closure, Marcaine infiltration and postoperative dressing with a knee  immobilizer.  The patient tolerated the procedure well and was transferred  to  the recovery room in stable condition.                                               Mark C. Ophelia Charter, M.D.    MCY/MEDQ  D:  07/11/2003  T:  07/12/2003  Job:  [id number]

## 2010-09-03 ENCOUNTER — Emergency Department (HOSPITAL_COMMUNITY): Payer: Medicare Other

## 2010-09-03 ENCOUNTER — Inpatient Hospital Stay (HOSPITAL_COMMUNITY)
Admission: EM | Admit: 2010-09-03 | Discharge: 2010-09-19 | DRG: 871 | Disposition: A | Discharge status: Skilled Nursing Facility | Payer: Medicare Other | Attending: Internal Medicine | Admitting: Internal Medicine

## 2010-09-03 DIAGNOSIS — S92919A Unspecified fracture of unspecified toe(s), initial encounter for closed fracture: Secondary | ICD-10-CM | POA: Diagnosis present

## 2010-09-03 DIAGNOSIS — J151 Pneumonia due to Pseudomonas: Secondary | ICD-10-CM | POA: Diagnosis present

## 2010-09-03 DIAGNOSIS — E785 Hyperlipidemia, unspecified: Secondary | ICD-10-CM | POA: Diagnosis present

## 2010-09-03 DIAGNOSIS — M1990 Unspecified osteoarthritis, unspecified site: Secondary | ICD-10-CM | POA: Diagnosis present

## 2010-09-03 DIAGNOSIS — E559 Vitamin D deficiency, unspecified: Secondary | ICD-10-CM | POA: Diagnosis present

## 2010-09-03 DIAGNOSIS — E039 Hypothyroidism, unspecified: Secondary | ICD-10-CM | POA: Diagnosis present

## 2010-09-03 DIAGNOSIS — G2581 Restless legs syndrome: Secondary | ICD-10-CM | POA: Diagnosis present

## 2010-09-03 DIAGNOSIS — Z7901 Long term (current) use of anticoagulants: Secondary | ICD-10-CM

## 2010-09-03 DIAGNOSIS — R5381 Other malaise: Secondary | ICD-10-CM | POA: Diagnosis present

## 2010-09-03 DIAGNOSIS — J9600 Acute respiratory failure, unspecified whether with hypoxia or hypercapnia: Secondary | ICD-10-CM | POA: Diagnosis not present

## 2010-09-03 DIAGNOSIS — I10 Essential (primary) hypertension: Secondary | ICD-10-CM | POA: Diagnosis present

## 2010-09-03 DIAGNOSIS — M009 Pyogenic arthritis, unspecified: Secondary | ICD-10-CM | POA: Diagnosis present

## 2010-09-03 DIAGNOSIS — I509 Heart failure, unspecified: Secondary | ICD-10-CM | POA: Diagnosis present

## 2010-09-03 DIAGNOSIS — A419 Sepsis, unspecified organism: Secondary | ICD-10-CM | POA: Diagnosis present

## 2010-09-03 DIAGNOSIS — I4891 Unspecified atrial fibrillation: Secondary | ICD-10-CM | POA: Diagnosis present

## 2010-09-03 DIAGNOSIS — T4275XA Adverse effect of unspecified antiepileptic and sedative-hypnotic drugs, initial encounter: Secondary | ICD-10-CM | POA: Diagnosis present

## 2010-09-03 DIAGNOSIS — N2581 Secondary hyperparathyroidism of renal origin: Secondary | ICD-10-CM | POA: Diagnosis present

## 2010-09-03 LAB — MRSA PCR SCREENING: MRSA by PCR: NEGATIVE

## 2010-09-03 LAB — DIFFERENTIAL
Basophils Relative: 0 % (ref 0–1)
Eosinophils Absolute: 0 10*3/uL (ref 0.0–0.7)
Eosinophils Relative: 0 % (ref 0–5)
Lymphs Abs: 0.7 10*3/uL (ref 0.7–4.0)
Monocytes Relative: 4 % (ref 3–12)
Neutrophils Relative %: 91 % — ABNORMAL HIGH (ref 43–77)

## 2010-09-03 LAB — URINALYSIS, ROUTINE W REFLEX MICROSCOPIC
Leukocytes, UA: NEGATIVE
Nitrite: POSITIVE — AB
Specific Gravity, Urine: 1.018 (ref 1.005–1.030)
Urobilinogen, UA: 1 mg/dL (ref 0.0–1.0)
pH: 6 (ref 5.0–8.0)

## 2010-09-03 LAB — CBC
MCH: 30.1 pg (ref 26.0–34.0)
MCV: 86.6 fL (ref 78.0–100.0)
Platelets: 311 10*3/uL (ref 150–400)
RDW: 14.4 % (ref 11.5–15.5)
WBC: 13.2 10*3/uL — ABNORMAL HIGH (ref 4.0–10.5)

## 2010-09-03 LAB — BASIC METABOLIC PANEL
BUN: 18 mg/dL (ref 6–23)
CO2: 28 mEq/L (ref 19–32)
Chloride: 94 mEq/L — ABNORMAL LOW (ref 96–112)
Creatinine, Ser: 1.04 mg/dL (ref 0.50–1.10)
Glucose, Bld: 115 mg/dL — ABNORMAL HIGH (ref 70–99)

## 2010-09-03 LAB — APTT: aPTT: 42 seconds — ABNORMAL HIGH (ref 24–37)

## 2010-09-03 LAB — PROTIME-INR: INR: 2.04 — ABNORMAL HIGH (ref 0.00–1.49)

## 2010-09-03 LAB — URINE MICROSCOPIC-ADD ON

## 2010-09-03 LAB — CK TOTAL AND CKMB (NOT AT ARMC): Total CK: 101 U/L (ref 7–177)

## 2010-09-04 ENCOUNTER — Inpatient Hospital Stay (HOSPITAL_COMMUNITY): Payer: Medicare Other

## 2010-09-04 DIAGNOSIS — A419 Sepsis, unspecified organism: Secondary | ICD-10-CM

## 2010-09-04 DIAGNOSIS — I4891 Unspecified atrial fibrillation: Secondary | ICD-10-CM

## 2010-09-04 DIAGNOSIS — R578 Other shock: Secondary | ICD-10-CM

## 2010-09-04 LAB — POCT I-STAT 3, ART BLOOD GAS (G3+)
Acid-Base Excess: 2 mmol/L (ref 0.0–2.0)
Acid-base deficit: 3 mmol/L — ABNORMAL HIGH (ref 0.0–2.0)
Bicarbonate: 28.1 mEq/L — ABNORMAL HIGH (ref 20.0–24.0)
Bicarbonate: 23.2 mEq/L (ref 20.0–24.0)
Patient temperature: 97.1
Patient temperature: 97.1
pH, Arterial: 7.399 (ref 7.350–7.400)

## 2010-09-04 LAB — COMPREHENSIVE METABOLIC PANEL
ALT: 7 U/L (ref 0–35)
BUN: 20 mg/dL (ref 6–23)
CO2: 27 mEq/L (ref 19–32)
Calcium: 9 mg/dL (ref 8.4–10.5)
Creatinine, Ser: 0.97 mg/dL (ref 0.50–1.10)
GFR calc Af Amer: >60 mL/min (ref >60)
GFR calc non Af Amer: 55 mL/min — ABNORMAL LOW (ref >60)
Glucose, Bld: 130 mg/dL — ABNORMAL HIGH (ref 70–99)

## 2010-09-04 LAB — DIFFERENTIAL
Basophils Relative: 0 % (ref 0–1)
Basophils Relative: 0 % (ref 0–1)
Eosinophils Relative: 0 % (ref 0–5)
Eosinophils Relative: 0 % (ref 0–5)
Lymphs Abs: 1.7 10*3/uL (ref 0.7–4.0)
Lymphs Abs: 1.3 10*3/uL (ref 0.7–4.0)
Monocytes Relative: 7 % (ref 3–12)
Monocytes Relative: 7 % (ref 3–12)
Neutrophils Relative %: 87 % — ABNORMAL HIGH (ref 43–77)
WBC Morphology: INCREASED

## 2010-09-04 LAB — GLUCOSE, CAPILLARY
Glucose-Capillary: 129 mg/dL — ABNORMAL HIGH (ref 70–99)
Glucose-Capillary: 121 mg/dL — ABNORMAL HIGH (ref 70–99)
Glucose-Capillary: 100 mg/dL — ABNORMAL HIGH (ref 70–99)

## 2010-09-04 LAB — CBC
HCT: 29.6 % — ABNORMAL LOW (ref 36.0–46.0)
Hemoglobin: 10.1 g/dL — ABNORMAL LOW (ref 12.0–15.0)
Hemoglobin: 10 g/dL — ABNORMAL LOW (ref 12.0–15.0)
MCV: 87.6 fL (ref 78.0–100.0)
MCV: 87.6 fL (ref 78.0–100.0)
Platelets: 270 10*3/uL (ref 150–400)
RBC: 3.46 MIL/uL — ABNORMAL LOW (ref 3.87–5.11)
WBC: 21.8 10*3/uL — ABNORMAL HIGH (ref 4.0–10.5)
WBC: 21.2 10*3/uL — ABNORMAL HIGH (ref 4.0–10.5)

## 2010-09-04 LAB — PROTIME-INR
INR: 2.01 — ABNORMAL HIGH (ref 0.00–1.49)
Prothrombin Time: 22 seconds — ABNORMAL HIGH (ref 11.6–15.2)

## 2010-09-04 LAB — PROCALCITONIN: Procalcitonin: 22.21 ng/mL

## 2010-09-04 LAB — BASIC METABOLIC PANEL
CO2: 28 mEq/L (ref 19–32)
Calcium: 9.4 mg/dL (ref 8.4–10.5)
Creatinine, Ser: 1.32 mg/dL — ABNORMAL HIGH (ref 0.50–1.10)
GFR calc non Af Amer: 39 mL/min — ABNORMAL LOW (ref >60)
Sodium: 138 mEq/L (ref 135–145)

## 2010-09-04 LAB — APTT: aPTT: 52 seconds — ABNORMAL HIGH (ref 24–37)

## 2010-09-04 LAB — TYPE AND SCREEN
ABO/RH(D): A POS
Antibody Screen: NEGATIVE

## 2010-09-04 LAB — LACTIC ACID, PLASMA: Lactic Acid, Venous: 1.6 mmol/L (ref 0.5–2.2)

## 2010-09-04 LAB — CARDIAC PANEL(CRET KIN+CKTOT+MB+TROPI)
CK, MB: 4.9 ng/mL — ABNORMAL HIGH (ref 0.3–4.0)
Total CK: 295 U/L — ABNORMAL HIGH (ref 7–177)

## 2010-09-04 LAB — CARBOXYHEMOGLOBIN: Methemoglobin: 0.3 % (ref 0.0–1.5)

## 2010-09-05 ENCOUNTER — Inpatient Hospital Stay (HOSPITAL_COMMUNITY): Payer: Medicare Other

## 2010-09-05 DIAGNOSIS — R6521 Severe sepsis with septic shock: Secondary | ICD-10-CM

## 2010-09-05 DIAGNOSIS — I509 Heart failure, unspecified: Secondary | ICD-10-CM

## 2010-09-05 DIAGNOSIS — I4891 Unspecified atrial fibrillation: Secondary | ICD-10-CM

## 2010-09-05 DIAGNOSIS — J9600 Acute respiratory failure, unspecified whether with hypoxia or hypercapnia: Secondary | ICD-10-CM

## 2010-09-05 DIAGNOSIS — A419 Sepsis, unspecified organism: Secondary | ICD-10-CM

## 2010-09-05 LAB — CBC
MCH: 29.2 pg (ref 26.0–34.0)
MCHC: 33.2 g/dL (ref 30.0–36.0)
Platelets: 261 10*3/uL (ref 150–400)
RDW: 15.3 % (ref 11.5–15.5)

## 2010-09-05 LAB — COMPREHENSIVE METABOLIC PANEL
ALT: 8 U/L (ref 0–35)
AST: 17 U/L (ref 0–37)
Albumin: 2.3 g/dL — ABNORMAL LOW (ref 3.5–5.2)
Calcium: 9 mg/dL (ref 8.4–10.5)
GFR calc Af Amer: >60 mL/min (ref >60)
Glucose, Bld: 114 mg/dL — ABNORMAL HIGH (ref 70–99)
Sodium: 142 mEq/L (ref 135–145)
Total Protein: 6.3 g/dL (ref 6.0–8.3)

## 2010-09-05 LAB — PROTIME-INR: Prothrombin Time: 32.8 seconds — ABNORMAL HIGH (ref 11.6–15.2)

## 2010-09-05 LAB — POCT I-STAT, CHEM 8
Calcium, Ion: 1.17 mmol/L (ref 1.12–1.32)
Chloride: 97 mEq/L (ref 96–112)
Glucose, Bld: 164 mg/dL — ABNORMAL HIGH (ref 70–99)
HCT: 37 % (ref 36.0–46.0)
TCO2: 28 mmol/L (ref 0–100)

## 2010-09-05 NOTE — H&P (Signed)
Leah [name], DVORSKY NO.:  [id number]  MEDICAL RECORD NO.:  [id number]  LOCATION:  3305                         FACILITY:  [facility]  PHYSICIAN:  Gaspar Garbe, M.D.DATE OF BIRTH:  [birth year]  DATE OF ADMISSION:  09/03/2010 DATE OF DISCHARGE:                             HISTORY & PHYSICAL   CHIEF COMPLAINT:  Falls, fever, fibrillation.  HISTORY OF PRESENT ILLNESS:  The patient is an 77 year old white female with a longstanding issue related to a left knee replacement from several years back.  She has had a culture grew with coag-negative staph coming from that knee and per report, has been on 2 courses of oral antibiotics, one for 14 days and another for 28 days without resolution of her infection.  Her history is mostly given by her sons who are present with her in the ER.  They indicate that the patient had seen Dr. Boneta Lucks at [facility] regarding this as she was referred there from Dr. Jerl Santos here locally thinking that he could better manage it.  The last meeting with Dr. Boneta Lucks was about 3 weeks ago and at that time, it was indicated that she had 2 surgical options, one was to fuse the knee and the other was to remove the hardware and come back later once she has cleared the infection and replace it.  She was given time between now and another visit in another 3 weeks from now to make a decision regarding this surgery.  Her family had currently been mauling it over.  This morning, the patient felt to be somewhat dizzy and weak and diaphoretic.  911 was called and the patient was brought to the emergency room and found to be in rapid atrial fibrillation, which responded to 5 mg of diltiazem, which was given per Dr. Elease Hashimoto.  He was initially called to admit the patient.  He indicated to me that the patient most likely did not have a primary cardiac problem, and I saw her in consultation and agreed to admit her based on the findings that most likely she  either has sepsis or infection in her knee, which is causing the majority of her medical issues.  She indicates that the pain in her knee has gotten worse over the past couple of weeks and that is very tender and very hard to stand on.  She is diaphoretic and having fevers actively in the emergency room at the time of her evaluation.  ALLERGIES:  CODEINE, DOXYCYCLINE, and BENADRYL.  MEDICATIONS: 1. Vitamin D 50,000 units once weekly. 2. Coumadin 2.5 mg daily. 3. Ropinirole 1 mg p.o. nightly. 4. Neurontin 300 mg nightly. 5. Levothyroxine 125 mcg daily. 6. Avapro 300 mg daily. 7. Metoprolol 50 mg b.i.d. 8. Lipitor 10 mg nightly. 9. Lasix 80 mg daily. 10.Vicodin as needed for pain.  PAST MEDICAL HISTORY: 1. Osteoarthritis of the knee with the aforementioned left total knee     surgery and subsequent infection with coag-negative staph.  The     culture was obtained on May 26, 2010, and is available on the     [facility] record. 2. Atrial fibrillation. 3. Hypertension. 4. Hypothyroidism. 5. CHF.  SOCIAL HISTORY:  The patient lives in [place] with her sons.  She is retired.  She is nonsmoker, nondrinker.  FAMILY HISTORY:  Noncontributory.  REVIEW OF SYSTEMS:  The patient has active fevers and chills at this point, feels generally weak and has joint swelling and arthralgias of her left knee.  She denies any cough, any productive sputum, and any shortness of breath.  She denies any current chest pain although she did feel like she had palpitations earlier, which have resolved after being seen by Dr. Elease Hashimoto.  She denies any abdominal pain and any other new rashes.  ADVANCE DIRECTIVES:  The patient remains a full code.  PHYSICAL EXAM:  VITAL SIGNS:  Temperature 101.2, pulse 86, respiratory rate 18, blood pressure initially in the 90s/60s, has risen into the one teens systolically with fluids. GENERAL:  No acute distress. HEENT:  Normocephalic,  atraumatic.  PERRLA.  EOMI.  ENT is within normal limits. NECK:  Supple.  No lymphadenopathy, JVD, or bruit. HEART:  Irregularly irregular consistent with atrial fibrillation with the rate currently in the 80s.  No murmurs appreciated. LUNGS:  Clear to auscultation bilaterally. ABDOMEN:  Soft, nontender, normoactive bowel sounds.  No hepatosplenomegaly. EXTREMITIES:  The patient's left knee is swollen and warmth to the touch.  There is no obvious streaking or lymphadenopathy.  Her knee is clearly postoperative with midline scar and is very difficult for her to bend.  She does have some fluid and mild ballottement noted. NEUROLOGIC:  The patient is oriented to person, place, and time and she has 5/5 strength.  We did not attempt to stand her on her bad knee.  Chest x-ray shows possibility of a right suprahilar density with possible pneumonia.  However, she has no clinical signs or symptoms of such.  LABORATORY DATA:  Laboratory tests initially performed shows a CBC of 13.2, hemoglobin 11.9, hematocrit 34.3, platelets 311.  Her neutrophil percentage is 91% with a left shift.  Electrolytes currently pending. CK 101, MB 2.2.  Her INR is therapeutic at 2.04.  Pro-BNP is slightly elevated at 2773.  ASSESSMENT/PLAN: 1. Coagulase-negative staphylococcus infection of her left knee.     Given the fact that she clinically has fever, chills, elevated     temperature, and a knee that is warm to the touch, painful and has     not responded to 2 courses of antibiotics, this is most likely her     source of illness.  I believe that the atrial fibrillation is most     likely related to her illness as does Dr. Elease Hashimoto.  I have agreed to     admit the patient.  I have made an attempt to have her go to     [facility] per the family's request.  Also, we had a long discussion     regarding that they will certainly be the ones to perform her     surgery, and I believe ultimately she needs to have her knee  joint     cleaned out and the attempt to wait 6 weeks while off antibiotics     to decide on a surgery clearly has led to her being in the hospital     at this point as she has failed conservative management.  I spoke     with Margie Billet who is an orthopedic surgeon there.  They do not     have a case time available for today, and I have been told to call  them back at [phone] to possibly get her transferred over     tomorrow with the indications that I should start IV antibiotics     and stabilize her.  I have also enlisted Dr. Jerl Santos here locally     who had seen her in the past and happened to be on call when we     came to see her regarding orthopedic management, so that they can     coordinate care with Dr. Lamar Sprinkles if in fact any drainage needs to be     done while she is here.  I think this is somewhat doubtful unless     she has considerable worsening of her symptoms even with IV     antibiotics, during the time that she is waiting for a bed at     [facility]. 2. Probable sepsis.  Blood cultures have been drawn, and vancomycin     and Zosyn coverage has been added.  This will cover her coag-     negative staph as well as this is most likely the culprit based on     her prior cultures and nonresponse. 3. Atrial fibrillation.  Cardizem has been given for control.  The     patient is being followed by Dr. Elease Hashimoto for Dr. Donnie Aho while in-     house and the patient will remain on Coumadin, which can be     reversed preoperatively if needed. 4. Hypertension.  Hold blood pressure medications at this point.     Certainly, hold her Lasix and her ARB.  We will continue her     metoprolol and her diltiazem, which can effect blood pressure, but     are mostly for rate control at this time. 5. Hyperlipidemia.  Continue Lipitor.     Gaspar Garbe, M.D.     RWT/MEDQ  D:  09/03/2010  T:  09/04/2010  Job:  [id number]  cc:   Vesta Mixer, M.D. Georga Hacking, M.D. Lubertha Basque  Jerl Santos, M.D. Dr. Boneta Lucks  Electronically Signed by Guerry Bruin M.D. on 09/05/2010 01:45:43 PM

## 2010-09-06 LAB — COMPREHENSIVE METABOLIC PANEL
ALT: 10 U/L (ref 0–35)
Albumin: 2.3 g/dL — ABNORMAL LOW (ref 3.5–5.2)
Alkaline Phosphatase: 114 U/L (ref 39–117)
GFR calc Af Amer: >60 mL/min (ref >60)
Glucose, Bld: 114 mg/dL — ABNORMAL HIGH (ref 70–99)
Potassium: 3.4 mEq/L — ABNORMAL LOW (ref 3.5–5.1)
Sodium: 140 mEq/L (ref 135–145)
Total Protein: 5.9 g/dL — ABNORMAL LOW (ref 6.0–8.3)

## 2010-09-06 LAB — CBC
Hemoglobin: 9.4 g/dL — ABNORMAL LOW (ref 12.0–15.0)
MCV: 88.9 fL (ref 78.0–100.0)
Platelets: 242 10*3/uL (ref 150–400)
RBC: 3.24 MIL/uL — ABNORMAL LOW (ref 3.87–5.11)
WBC: 13.1 10*3/uL — ABNORMAL HIGH (ref 4.0–10.5)

## 2010-09-06 LAB — CULTURE, BLOOD (ROUTINE X 2)

## 2010-09-07 ENCOUNTER — Inpatient Hospital Stay (HOSPITAL_COMMUNITY): Payer: Medicare Other

## 2010-09-07 DIAGNOSIS — Y831 Surgical operation with implant of artificial internal device as the cause of abnormal reaction of the patient, or of later complication, without mention of misadventure at the time of the procedure: Secondary | ICD-10-CM

## 2010-09-07 DIAGNOSIS — R6521 Severe sepsis with septic shock: Secondary | ICD-10-CM

## 2010-09-07 DIAGNOSIS — T8450XA Infection and inflammatory reaction due to unspecified internal joint prosthesis, initial encounter: Secondary | ICD-10-CM

## 2010-09-07 DIAGNOSIS — B965 Pseudomonas (aeruginosa) (mallei) (pseudomallei) as the cause of diseases classified elsewhere: Secondary | ICD-10-CM

## 2010-09-07 DIAGNOSIS — R7881 Bacteremia: Secondary | ICD-10-CM

## 2010-09-07 DIAGNOSIS — A4152 Sepsis due to Pseudomonas: Secondary | ICD-10-CM

## 2010-09-07 DIAGNOSIS — A419 Sepsis, unspecified organism: Secondary | ICD-10-CM

## 2010-09-07 DIAGNOSIS — R6520 Severe sepsis without septic shock: Secondary | ICD-10-CM

## 2010-09-07 DIAGNOSIS — J9600 Acute respiratory failure, unspecified whether with hypoxia or hypercapnia: Secondary | ICD-10-CM

## 2010-09-07 LAB — BASIC METABOLIC PANEL
BUN: 9 mg/dL (ref 6–23)
BUN: 9 mg/dL (ref 6–23)
CO2: 35 mEq/L — ABNORMAL HIGH (ref 19–32)
Calcium: 10.1 mg/dL (ref 8.4–10.5)
Chloride: 99 mEq/L (ref 96–112)
GFR calc non Af Amer: >60 mL/min (ref >60)
Glucose, Bld: 102 mg/dL — ABNORMAL HIGH (ref 70–99)
Glucose, Bld: 119 mg/dL — ABNORMAL HIGH (ref 70–99)
Potassium: 3.5 mEq/L (ref 3.5–5.1)
Sodium: 135 mEq/L (ref 135–145)

## 2010-09-07 LAB — CBC
Hemoglobin: 9.7 g/dL — ABNORMAL LOW (ref 12.0–15.0)
MCH: 28.9 pg (ref 26.0–34.0)
MCV: 88.1 fL (ref 78.0–100.0)
RBC: 3.36 MIL/uL — ABNORMAL LOW (ref 3.87–5.11)

## 2010-09-07 LAB — BLOOD GAS, ARTERIAL
Bicarbonate: 32.8 mEq/L — ABNORMAL HIGH (ref 20.0–24.0)
Patient temperature: 98.6
pH, Arterial: 7.497 — ABNORMAL HIGH (ref 7.350–7.400)
pO2, Arterial: 80.4 mmHg (ref 80.0–100.0)

## 2010-09-07 LAB — PRO B NATRIURETIC PEPTIDE: Pro B Natriuretic peptide (BNP): 4462 pg/mL — ABNORMAL HIGH (ref 0–450)

## 2010-09-07 LAB — CULTURE, BLOOD (ROUTINE X 2): Culture  Setup Time: 201207211821

## 2010-09-07 LAB — PHOSPHORUS: Phosphorus: 2 mg/dL — ABNORMAL LOW (ref 2.3–4.6)

## 2010-09-08 ENCOUNTER — Inpatient Hospital Stay (HOSPITAL_COMMUNITY): Payer: Medicare Other

## 2010-09-08 LAB — BASIC METABOLIC PANEL
CO2: 34 mEq/L — ABNORMAL HIGH (ref 19–32)
Chloride: 97 mEq/L (ref 96–112)
GFR calc Af Amer: >60 mL/min (ref >60)
Potassium: 3.4 mEq/L — ABNORMAL LOW (ref 3.5–5.1)

## 2010-09-08 LAB — CBC
HCT: 31.6 % — ABNORMAL LOW (ref 36.0–46.0)
Hemoglobin: 10.3 g/dL — ABNORMAL LOW (ref 12.0–15.0)
MCH: 28.7 pg (ref 26.0–34.0)
RBC: 3.59 MIL/uL — ABNORMAL LOW (ref 3.87–5.11)

## 2010-09-08 LAB — PROTIME-INR
INR: 2.94 — ABNORMAL HIGH (ref 0.00–1.49)
Prothrombin Time: 31.1 seconds — ABNORMAL HIGH (ref 11.6–15.2)

## 2010-09-09 ENCOUNTER — Inpatient Hospital Stay (HOSPITAL_COMMUNITY): Payer: Medicare Other

## 2010-09-09 DIAGNOSIS — T8450XA Infection and inflammatory reaction due to unspecified internal joint prosthesis, initial encounter: Secondary | ICD-10-CM

## 2010-09-09 DIAGNOSIS — Z96659 Presence of unspecified artificial knee joint: Secondary | ICD-10-CM

## 2010-09-09 LAB — CBC
MCH: 28.9 pg (ref 26.0–34.0)
MCHC: 32.8 g/dL (ref 30.0–36.0)
Platelets: 330 10*3/uL (ref 150–400)
RBC: 3.56 MIL/uL — ABNORMAL LOW (ref 3.87–5.11)

## 2010-09-09 LAB — BASIC METABOLIC PANEL
BUN: 13 mg/dL (ref 6–23)
Creatinine, Ser: 0.59 mg/dL (ref 0.50–1.10)
GFR calc Af Amer: >60 mL/min (ref >60)
GFR calc non Af Amer: >60 mL/min (ref >60)
Potassium: 4.1 mEq/L (ref 3.5–5.1)

## 2010-09-09 LAB — PROTIME-INR: Prothrombin Time: 32.7 seconds — ABNORMAL HIGH (ref 11.6–15.2)

## 2010-09-10 ENCOUNTER — Inpatient Hospital Stay (HOSPITAL_COMMUNITY): Payer: Medicare Other

## 2010-09-10 LAB — BASIC METABOLIC PANEL
Calcium: 10.3 mg/dL (ref 8.4–10.5)
GFR calc non Af Amer: >60 mL/min (ref >60)
Sodium: 137 mEq/L (ref 135–145)

## 2010-09-10 LAB — CBC
MCH: 28.9 pg (ref 26.0–34.0)
MCHC: 32.9 g/dL (ref 30.0–36.0)
Platelets: 348 10*3/uL (ref 150–400)

## 2010-09-10 LAB — PRO B NATRIURETIC PEPTIDE: Pro B Natriuretic peptide (BNP): 1535 pg/mL — ABNORMAL HIGH (ref 0–450)

## 2010-09-10 LAB — PROTIME-INR: Prothrombin Time: 35.3 seconds — ABNORMAL HIGH (ref 11.6–15.2)

## 2010-09-11 LAB — GLUCOSE, CAPILLARY: Glucose-Capillary: 116 mg/dL — ABNORMAL HIGH (ref 70–99)

## 2010-09-11 LAB — COMPREHENSIVE METABOLIC PANEL
ALT: 23 U/L (ref 0–35)
AST: 42 U/L — ABNORMAL HIGH (ref 0–37)
Calcium: 10.6 mg/dL — ABNORMAL HIGH (ref 8.4–10.5)
GFR calc Af Amer: >60 mL/min (ref >60)
Glucose, Bld: 131 mg/dL — ABNORMAL HIGH (ref 70–99)
Sodium: 137 mEq/L (ref 135–145)
Total Protein: 6.4 g/dL (ref 6.0–8.3)

## 2010-09-11 LAB — PRO B NATRIURETIC PEPTIDE: Pro B Natriuretic peptide (BNP): 1277 pg/mL — ABNORMAL HIGH (ref 0–450)

## 2010-09-11 LAB — CBC
MCH: 28.4 pg (ref 26.0–34.0)
MCHC: 31.9 g/dL (ref 30.0–36.0)
Platelets: 383 10*3/uL (ref 150–400)

## 2010-09-11 LAB — PROTIME-INR: INR: 3.24 — ABNORMAL HIGH (ref 0.00–1.49)

## 2010-09-12 ENCOUNTER — Inpatient Hospital Stay (HOSPITAL_COMMUNITY): Payer: Medicare Other

## 2010-09-12 LAB — CBC
Hemoglobin: 10.5 g/dL — ABNORMAL LOW (ref 12.0–15.0)
MCHC: 32.4 g/dL (ref 30.0–36.0)
Platelets: 373 10*3/uL (ref 150–400)
RDW: 15.1 % (ref 11.5–15.5)

## 2010-09-12 LAB — PROTIME-INR
INR: 3.54 — ABNORMAL HIGH (ref 0.00–1.49)
Prothrombin Time: 36 seconds — ABNORMAL HIGH (ref 11.6–15.2)

## 2010-09-12 LAB — COMPREHENSIVE METABOLIC PANEL
AST: 49 U/L — ABNORMAL HIGH (ref 0–37)
Albumin: 2.1 g/dL — ABNORMAL LOW (ref 3.5–5.2)
Calcium: 11 mg/dL — ABNORMAL HIGH (ref 8.4–10.5)
Chloride: 92 mEq/L — ABNORMAL LOW (ref 96–112)
Creatinine, Ser: 0.66 mg/dL (ref 0.50–1.10)
Total Protein: 6.4 g/dL (ref 6.0–8.3)

## 2010-09-12 NOTE — Consult Note (Signed)
NAMEMarland Kitchen  [name], Leah [name] NO.:  [id number]  MEDICAL RECORD NO.:  [id number]  LOCATION:  3305                         FACILITY:  [facility]  PHYSICIAN:  Vesta Mixer, M.D. DATE OF BIRTH:  07/16/1927  DATE OF CONSULTATION:  09/03/2010 DATE OF DISCHARGE:                                CONSULTATION   Leah [name] is an 77 year old female with a history of chronic atrial fibrillation.  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  She has a history of hypothyroidism and hypertension. The patient has felt poorly for the past several months.  She has had a very poor appetite.  This morning, she awoke with severe dyspnea and diaphoresis.  She was brought to the emergency room and was found to have rapid atrial fibrillation.  The patient was treated with low-dose IV Cardizem and her heart rate improved.  We are asked to see her for further evaluation.  The patient has a history of chronic atrial fibrillation.  She is status post cardioversion x2 which were ultimately unsuccessful.  The decision has been made to continue with rate control and chronic anticoagulation. The patient has tolerated her atrial fibrillation quite well.  She is status post left total knee replacement.  The knee became infected a couple of months ago and she has had numerous evaluations at [facility].  Culture of the knee was positive for strep infection. She was treated with 2 months of antibiotics but the infection apparently has continued, but the antibiotics were stopped.  The patient woke up this morning at 5 o'clock with profuse sweating and diaphoresis.  She denied any cough or sputum production.  She denies any syncope or presyncope.  She came to the ER and was found to have atrial fibrillation.  The patient denies any blood in her urine or blood in her stool.  She denies any fevers.  She denies any weight loss but she has not been able to weigh because  she cannot stand up on the scales.  CURRENT MEDICATIONS: 1. Coumadin once a day. 2. Metoprolol twice a day. 3. Avapro once a day. 4. Norvasc once a day. 5. Synthroid once a day. 6. Tylenol with oxycodone as needed.  The family does not have the exact doses.  She has no known drug allergies.  PAST MEDICAL HISTORY: 1. Chronic atrial fibrillation. 2. Infected left knee prosthesis. 3. Hypothyroidism. 4. Hypertension.  SOCIAL HISTORY:  The patient has a history of smoking in the remote past.  FAMILY HISTORY:  Positive for cardiac problems in her mother.  REVIEW OF SYSTEMS:  As noted in the HPI.  All other review of systems were reviewed and are negative except for the patient has had a relatively poor appetite.  She is wheelchair-bound.  PHYSICAL EXAMINATION:  GENERAL:  She is a chronically ill-appearing white female, in no acute distress. VITAL SIGNS:  Her initial heart rate was in 130s, it is currently 85 after IV diltiazem.  Her blood pressure is 82/56.  Initial temperature was afebrile, but we will be rechecking a rectal temperature for verification. HEENT:  Reveals 2+ carotids.  There is no JVD.  Her mucous membranes are moist. NECK:  Supple. BACK:  Nontender. LUNGS:  Rales on the right side.  She has relatively poor inspiratory effort. HEART:  Irregularly irregular.  There is no significant murmurs. ABDOMEN:  Relatively benign.  There is no hepatosplenomegaly. EXTREMITIES:  She has no edema.  The left knee is quite warm.  There is not significant amount of fluid. NEUROLOGIC:  Nonfocal.  LABORATORY DATA:  White blood cell count is 13.2, hematocrit is 11.9, hemoglobin is 34.3.  Sodium is 136, potassium is 3.4, chloride is 97, BUN 17, creatinine is 1.0, glucose is 164.  Her INR is 2.04.  Her total CPK is 101 with 2.2 MBs.  IMPRESSION AND PLAN: 1. Rapid atrial fibrillation.  I doubt that this is a primary cardiac     issue.  I think that is secondary to an  underlying infection either     in the knee or perhaps pneumonia.  She has a known left knee     infection which grew out strep several months ago.  She also has     evidence of pneumonia on x-ray.  This atrial fibrillation is     chronic and I suspect that the rapid rate is due to her underlying     systemic illness.  We will continue with the metoprolol.  We will     continue with IV diltiazem for now and treat her underlying     infection. 2. Infection.  The patient is profusely diaphoretic.  We will be     checking a rectal temperature.  I suspect that she may have a     sepsis syndrome.  She has a known infection of her left total knee     and also may have pneumonia.  We will get a repeat chest x-ray to     further evaluate her pneumonia.  Dr. Wylene Simmer is here to evaluate     the knee     further.  He may repeat knee tap to see if we can get some fluid     for culture.  We withdrawn blood cultures.  We will treat the     patient with Rocephin for her pneumonia unless otherwise directed     by Dr. Wylene Simmer. 3. Anorexia.  This may be due to chronic underlying infection.     Internal Medicine to evaluate further.     Vesta Mixer, M.D.     PJN/MEDQ  D:  09/03/2010  T:  09/03/2010  Job:  [id number]  cc:   Larina Earthly, M.D. Georga Hacking, M.D. Orthopedic Department, [facility]  Electronically Signed by Kristeen Miss M.D. on 09/12/2010 12:13:23 PM

## 2010-09-13 ENCOUNTER — Inpatient Hospital Stay (HOSPITAL_COMMUNITY): Payer: Medicare Other

## 2010-09-13 LAB — COMPREHENSIVE METABOLIC PANEL
ALT: 23 U/L (ref 0–35)
Alkaline Phosphatase: 144 U/L — ABNORMAL HIGH (ref 39–117)
BUN: 19 mg/dL (ref 6–23)
Chloride: 91 mEq/L — ABNORMAL LOW (ref 96–112)
GFR calc Af Amer: >60 mL/min (ref >60)
Glucose, Bld: 127 mg/dL — ABNORMAL HIGH (ref 70–99)
Potassium: 4 mEq/L (ref 3.5–5.1)
Sodium: 131 mEq/L — ABNORMAL LOW (ref 135–145)
Total Bilirubin: 0.4 mg/dL (ref 0.3–1.2)

## 2010-09-13 LAB — CBC
HCT: 30 % — ABNORMAL LOW (ref 36.0–46.0)
Hemoglobin: 9.9 g/dL — ABNORMAL LOW (ref 12.0–15.0)
MCH: 29.1 pg (ref 26.0–34.0)
MCV: 88.2 fL (ref 78.0–100.0)
RBC: 3.4 MIL/uL — ABNORMAL LOW (ref 3.87–5.11)
WBC: 15.9 10*3/uL — ABNORMAL HIGH (ref 4.0–10.5)

## 2010-09-13 LAB — CULTURE, BLOOD (ROUTINE X 2)
Culture  Setup Time: 201207251310
Culture: NO GROWTH

## 2010-09-13 LAB — PRO B NATRIURETIC PEPTIDE: Pro B Natriuretic peptide (BNP): 1210 pg/mL — ABNORMAL HIGH (ref 0–450)

## 2010-09-14 DIAGNOSIS — T8450XA Infection and inflammatory reaction due to unspecified internal joint prosthesis, initial encounter: Secondary | ICD-10-CM

## 2010-09-14 DIAGNOSIS — B965 Pseudomonas (aeruginosa) (mallei) (pseudomallei) as the cause of diseases classified elsewhere: Secondary | ICD-10-CM

## 2010-09-14 DIAGNOSIS — Z96659 Presence of unspecified artificial knee joint: Secondary | ICD-10-CM

## 2010-09-14 DIAGNOSIS — Y831 Surgical operation with implant of artificial internal device as the cause of abnormal reaction of the patient, or of later complication, without mention of misadventure at the time of the procedure: Secondary | ICD-10-CM

## 2010-09-14 DIAGNOSIS — R7881 Bacteremia: Secondary | ICD-10-CM

## 2010-09-14 LAB — CBC
Hemoglobin: 9.7 g/dL — ABNORMAL LOW (ref 12.0–15.0)
MCH: 28.5 pg (ref 26.0–34.0)
MCHC: 32.3 g/dL (ref 30.0–36.0)
MCV: 88.2 fL (ref 78.0–100.0)
Platelets: 384 10*3/uL (ref 150–400)
RBC: 3.4 MIL/uL — ABNORMAL LOW (ref 3.87–5.11)

## 2010-09-14 LAB — BASIC METABOLIC PANEL
BUN: 17 mg/dL (ref 6–23)
CO2: 35 mEq/L — ABNORMAL HIGH (ref 19–32)
Calcium: 10.8 mg/dL — ABNORMAL HIGH (ref 8.4–10.5)
Chloride: 96 mEq/L (ref 96–112)
Creatinine, Ser: 0.64 mg/dL (ref 0.50–1.10)
GFR calc Af Amer: >60 mL/min (ref >60)

## 2010-09-14 LAB — GLUCOSE, CAPILLARY: Glucose-Capillary: 143 mg/dL — ABNORMAL HIGH (ref 70–99)

## 2010-09-14 LAB — PROTIME-INR
INR: 2.45 — ABNORMAL HIGH (ref 0.00–1.49)
Prothrombin Time: 27 seconds — ABNORMAL HIGH (ref 11.6–15.2)

## 2010-09-15 ENCOUNTER — Inpatient Hospital Stay (HOSPITAL_COMMUNITY): Payer: Medicare Other

## 2010-09-15 LAB — CBC
HCT: 29.2 % — ABNORMAL LOW (ref 36.0–46.0)
MCH: 28.3 pg (ref 26.0–34.0)
MCV: 88.8 fL (ref 78.0–100.0)
Platelets: 405 10*3/uL — ABNORMAL HIGH (ref 150–400)
RBC: 3.29 MIL/uL — ABNORMAL LOW (ref 3.87–5.11)
WBC: 11.3 10*3/uL — ABNORMAL HIGH (ref 4.0–10.5)

## 2010-09-15 LAB — BASIC METABOLIC PANEL
BUN: 15 mg/dL (ref 6–23)
CO2: 32 mEq/L (ref 19–32)
Chloride: 95 mEq/L — ABNORMAL LOW (ref 96–112)
Glucose, Bld: 107 mg/dL — ABNORMAL HIGH (ref 70–99)
Potassium: 3.4 mEq/L — ABNORMAL LOW (ref 3.5–5.1)
Sodium: 136 mEq/L (ref 135–145)

## 2010-09-16 LAB — UIFE/LIGHT CHAINS/TP QN, 24-HR UR
Albumin, U: DETECTED
Alpha 1, Urine: DETECTED — AB
Free Kappa/Lambda Ratio: 3.23 ratio (ref 2.04–10.37)
Free Lambda Lt Chains,Ur: 0.48 mg/dL (ref 0.02–0.67)
Gamma Globulin, Urine: DETECTED — AB
Total Protein, Urine: 6.1 mg/dL

## 2010-09-16 LAB — COMPREHENSIVE METABOLIC PANEL
ALT: 12 U/L (ref 0–35)
Albumin: 1.9 g/dL — ABNORMAL LOW (ref 3.5–5.2)
Calcium: 10.3 mg/dL (ref 8.4–10.5)
GFR calc Af Amer: >60 mL/min (ref >60)
Glucose, Bld: 102 mg/dL — ABNORMAL HIGH (ref 70–99)
Sodium: 136 mEq/L (ref 135–145)
Total Protein: 5.9 g/dL — ABNORMAL LOW (ref 6.0–8.3)

## 2010-09-16 LAB — CBC
MCV: 88.9 fL (ref 78.0–100.0)
Platelets: 420 10*3/uL — ABNORMAL HIGH (ref 150–400)
RBC: 3.25 MIL/uL — ABNORMAL LOW (ref 3.87–5.11)
RDW: 15.4 % (ref 11.5–15.5)
WBC: 8.6 10*3/uL (ref 4.0–10.5)

## 2010-09-16 LAB — PROTEIN ELECTROPHORESIS, SERUM
Albumin ELP: 38.4 % — ABNORMAL LOW (ref 55.8–66.1)
Alpha-1-Globulin: 10.1 % — ABNORMAL HIGH (ref 2.9–4.9)
Beta Globulin: 7.2 % (ref 4.7–7.2)
Total Protein ELP: 6.1 g/dL (ref 6.0–8.3)

## 2010-09-16 LAB — PROTIME-INR: Prothrombin Time: 28.3 seconds — ABNORMAL HIGH (ref 11.6–15.2)

## 2010-09-16 LAB — PTH, INTACT AND CALCIUM: PTH: 74.8 pg/mL — ABNORMAL HIGH (ref 14.0–72.0)

## 2010-09-17 LAB — DIFFERENTIAL
Basophils Absolute: 0 10*3/uL (ref 0.0–0.1)
Lymphocytes Relative: 14 % (ref 12–46)
Neutro Abs: 6.4 10*3/uL (ref 1.7–7.7)

## 2010-09-17 LAB — BASIC METABOLIC PANEL
BUN: 12 mg/dL (ref 6–23)
CO2: 31 mEq/L (ref 19–32)
Chloride: 97 mEq/L (ref 96–112)
GFR calc Af Amer: >60 mL/min (ref >60)
Glucose, Bld: 145 mg/dL — ABNORMAL HIGH (ref 70–99)
Potassium: 3.3 mEq/L — ABNORMAL LOW (ref 3.5–5.1)

## 2010-09-17 LAB — CBC
HCT: 30.5 % — ABNORMAL LOW (ref 36.0–46.0)
Hemoglobin: 9.7 g/dL — ABNORMAL LOW (ref 12.0–15.0)
WBC: 8.9 10*3/uL (ref 4.0–10.5)

## 2010-09-18 LAB — PROTIME-INR
INR: 2.63 — ABNORMAL HIGH (ref 0.00–1.49)
Prothrombin Time: 28.5 seconds — ABNORMAL HIGH (ref 11.6–15.2)

## 2010-09-19 LAB — PROTIME-INR
INR: 2.67 — ABNORMAL HIGH (ref 0.00–1.49)
Prothrombin Time: 28.9 seconds — ABNORMAL HIGH (ref 11.6–15.2)

## 2010-09-27 ENCOUNTER — Inpatient Hospital Stay (HOSPITAL_COMMUNITY)
Admission: EM | Admit: 2010-09-27 | Discharge: 2010-10-13 | DRG: 193 | Disposition: A | Discharge status: Skilled Nursing Facility | Payer: Medicare Other | Attending: Internal Medicine | Admitting: Internal Medicine

## 2010-09-27 ENCOUNTER — Inpatient Hospital Stay (HOSPITAL_COMMUNITY): Payer: Medicare Other

## 2010-09-27 ENCOUNTER — Emergency Department (HOSPITAL_COMMUNITY): Payer: Medicare Other

## 2010-09-27 DIAGNOSIS — Z7901 Long term (current) use of anticoagulants: Secondary | ICD-10-CM

## 2010-09-27 DIAGNOSIS — I4891 Unspecified atrial fibrillation: Secondary | ICD-10-CM | POA: Diagnosis present

## 2010-09-27 DIAGNOSIS — E559 Vitamin D deficiency, unspecified: Secondary | ICD-10-CM | POA: Diagnosis present

## 2010-09-27 DIAGNOSIS — E039 Hypothyroidism, unspecified: Secondary | ICD-10-CM | POA: Diagnosis present

## 2010-09-27 DIAGNOSIS — E785 Hyperlipidemia, unspecified: Secondary | ICD-10-CM | POA: Diagnosis present

## 2010-09-27 DIAGNOSIS — F3289 Other specified depressive episodes: Secondary | ICD-10-CM | POA: Diagnosis present

## 2010-09-27 DIAGNOSIS — I509 Heart failure, unspecified: Secondary | ICD-10-CM | POA: Diagnosis present

## 2010-09-27 DIAGNOSIS — Z9981 Dependence on supplemental oxygen: Secondary | ICD-10-CM

## 2010-09-27 DIAGNOSIS — Y831 Surgical operation with implant of artificial internal device as the cause of abnormal reaction of the patient, or of later complication, without mention of misadventure at the time of the procedure: Secondary | ICD-10-CM | POA: Diagnosis present

## 2010-09-27 DIAGNOSIS — F411 Generalized anxiety disorder: Secondary | ICD-10-CM | POA: Diagnosis present

## 2010-09-27 DIAGNOSIS — I10 Essential (primary) hypertension: Secondary | ICD-10-CM | POA: Diagnosis present

## 2010-09-27 DIAGNOSIS — M1990 Unspecified osteoarthritis, unspecified site: Secondary | ICD-10-CM | POA: Diagnosis present

## 2010-09-27 DIAGNOSIS — Z96659 Presence of unspecified artificial knee joint: Secondary | ICD-10-CM

## 2010-09-27 DIAGNOSIS — I5033 Acute on chronic diastolic (congestive) heart failure: Secondary | ICD-10-CM | POA: Diagnosis present

## 2010-09-27 DIAGNOSIS — J90 Pleural effusion, not elsewhere classified: Secondary | ICD-10-CM | POA: Diagnosis present

## 2010-09-27 DIAGNOSIS — Z6833 Body mass index (BMI) 33.0-33.9, adult: Secondary | ICD-10-CM

## 2010-09-27 DIAGNOSIS — E213 Hyperparathyroidism, unspecified: Secondary | ICD-10-CM | POA: Diagnosis present

## 2010-09-27 DIAGNOSIS — D72829 Elevated white blood cell count, unspecified: Secondary | ICD-10-CM | POA: Diagnosis present

## 2010-09-27 DIAGNOSIS — J189 Pneumonia, unspecified organism: Principal | ICD-10-CM | POA: Diagnosis present

## 2010-09-27 DIAGNOSIS — N3289 Other specified disorders of bladder: Secondary | ICD-10-CM | POA: Diagnosis present

## 2010-09-27 DIAGNOSIS — B957 Other staphylococcus as the cause of diseases classified elsewhere: Secondary | ICD-10-CM | POA: Diagnosis present

## 2010-09-27 DIAGNOSIS — Z79899 Other long term (current) drug therapy: Secondary | ICD-10-CM

## 2010-09-27 DIAGNOSIS — T8450XA Infection and inflammatory reaction due to unspecified internal joint prosthesis, initial encounter: Secondary | ICD-10-CM | POA: Diagnosis present

## 2010-09-27 DIAGNOSIS — A0472 Enterocolitis due to Clostridium difficile, not specified as recurrent: Secondary | ICD-10-CM | POA: Diagnosis not present

## 2010-09-27 DIAGNOSIS — T441X5A Adverse effect of other parasympathomimetics [cholinergics], initial encounter: Secondary | ICD-10-CM | POA: Diagnosis present

## 2010-09-27 DIAGNOSIS — F329 Major depressive disorder, single episode, unspecified: Secondary | ICD-10-CM | POA: Diagnosis present

## 2010-09-27 DIAGNOSIS — R339 Retention of urine, unspecified: Secondary | ICD-10-CM | POA: Diagnosis not present

## 2010-09-27 DIAGNOSIS — R5381 Other malaise: Secondary | ICD-10-CM | POA: Diagnosis present

## 2010-09-27 DIAGNOSIS — G2581 Restless legs syndrome: Secondary | ICD-10-CM | POA: Diagnosis present

## 2010-09-27 LAB — BASIC METABOLIC PANEL
CO2: 28 mEq/L (ref 19–32)
Calcium: 10.2 mg/dL (ref 8.4–10.5)
GFR calc non Af Amer: 59 mL/min — ABNORMAL LOW (ref >60)
Sodium: 140 mEq/L (ref 135–145)

## 2010-09-27 LAB — MRSA PCR SCREENING: MRSA by PCR: NEGATIVE

## 2010-09-27 LAB — CBC
MCH: 28.3 pg (ref 26.0–34.0)
Platelets: 364 10*3/uL (ref 150–400)
RBC: 3.67 MIL/uL — ABNORMAL LOW (ref 3.87–5.11)

## 2010-09-27 LAB — CK TOTAL AND CKMB (NOT AT ARMC)
CK, MB: 2.2 ng/mL (ref 0.3–4.0)
Relative Index: INVALID (ref 0.0–2.5)

## 2010-09-27 LAB — CARDIAC PANEL(CRET KIN+CKTOT+MB+TROPI): Total CK: 20 U/L (ref 7–177)

## 2010-09-27 LAB — PRO B NATRIURETIC PEPTIDE: Pro B Natriuretic peptide (BNP): 3354 pg/mL — ABNORMAL HIGH (ref 0–450)

## 2010-09-27 LAB — DIFFERENTIAL
Basophils Relative: 0 % (ref 0–1)
Eosinophils Absolute: 0.1 10*3/uL (ref 0.0–0.7)
Monocytes Relative: 5 % (ref 3–12)
Neutrophils Relative %: 76 % (ref 43–77)

## 2010-09-28 ENCOUNTER — Inpatient Hospital Stay (HOSPITAL_COMMUNITY): Payer: Medicare Other

## 2010-09-28 LAB — COMPREHENSIVE METABOLIC PANEL
ALT: 10 U/L (ref 0–35)
AST: 13 U/L (ref 0–37)
Alkaline Phosphatase: 99 U/L (ref 39–117)
CO2: 23 mEq/L (ref 19–32)
Calcium: 10.3 mg/dL (ref 8.4–10.5)
Chloride: 98 mEq/L (ref 96–112)
GFR calc non Af Amer: 50 mL/min — ABNORMAL LOW (ref >60)
Potassium: 3 mEq/L — ABNORMAL LOW (ref 3.5–5.1)
Sodium: 137 mEq/L (ref 135–145)
Total Bilirubin: 0.3 mg/dL (ref 0.3–1.2)

## 2010-09-28 LAB — PRO B NATRIURETIC PEPTIDE: Pro B Natriuretic peptide (BNP): 3550 pg/mL — ABNORMAL HIGH (ref 0–450)

## 2010-09-28 LAB — CBC
HCT: 30.4 % — ABNORMAL LOW (ref 36.0–46.0)
Hemoglobin: 9.9 g/dL — ABNORMAL LOW (ref 12.0–15.0)
MCH: 28.2 pg (ref 26.0–34.0)
MCHC: 32.6 g/dL (ref 30.0–36.0)
MCV: 86.6 fL (ref 78.0–100.0)

## 2010-09-29 ENCOUNTER — Inpatient Hospital Stay (HOSPITAL_COMMUNITY): Payer: Medicare Other

## 2010-09-29 LAB — BASIC METABOLIC PANEL
CO2: 28 mEq/L (ref 19–32)
Glucose, Bld: 140 mg/dL — ABNORMAL HIGH (ref 70–99)
Potassium: 3.9 mEq/L (ref 3.5–5.1)
Sodium: 139 mEq/L (ref 135–145)

## 2010-09-29 LAB — CBC
HCT: 27.5 % — ABNORMAL LOW (ref 36.0–46.0)
Hemoglobin: 8.9 g/dL — ABNORMAL LOW (ref 12.0–15.0)
MCH: 28 pg (ref 26.0–34.0)
MCHC: 32.4 g/dL (ref 30.0–36.0)
MCV: 86.5 fL (ref 78.0–100.0)
Platelets: 271 10*3/uL (ref 150–400)
RBC: 3.18 MIL/uL — ABNORMAL LOW (ref 3.87–5.11)
RDW: 15.8 % — ABNORMAL HIGH (ref 11.5–15.5)
WBC: 15.3 10*3/uL — ABNORMAL HIGH (ref 4.0–10.5)

## 2010-09-30 ENCOUNTER — Inpatient Hospital Stay (HOSPITAL_COMMUNITY): Payer: Medicare Other

## 2010-09-30 LAB — COMPREHENSIVE METABOLIC PANEL
AST: 16 U/L (ref 0–37)
BUN: 13 mg/dL (ref 6–23)
CO2: 31 mEq/L (ref 19–32)
Chloride: 101 mEq/L (ref 96–112)
Creatinine, Ser: 0.63 mg/dL (ref 0.50–1.10)
GFR calc Af Amer: >60 mL/min (ref >60)
GFR calc non Af Amer: >60 mL/min (ref >60)
Glucose, Bld: 95 mg/dL (ref 70–99)
Total Bilirubin: 0.3 mg/dL (ref 0.3–1.2)

## 2010-09-30 LAB — CBC
HCT: 28.4 % — ABNORMAL LOW (ref 36.0–46.0)
MCV: 87.1 fL (ref 78.0–100.0)
RBC: 3.26 MIL/uL — ABNORMAL LOW (ref 3.87–5.11)
WBC: 8 10*3/uL (ref 4.0–10.5)

## 2010-09-30 LAB — CLOSTRIDIUM DIFFICILE BY PCR: Toxigenic C. Difficile by PCR: POSITIVE — AB

## 2010-10-01 DIAGNOSIS — I5031 Acute diastolic (congestive) heart failure: Secondary | ICD-10-CM

## 2010-10-01 LAB — CBC
MCV: 87.2 fL (ref 78.0–100.0)
Platelets: 262 10*3/uL (ref 150–400)
RDW: 15.9 % — ABNORMAL HIGH (ref 11.5–15.5)
WBC: 6.7 10*3/uL (ref 4.0–10.5)

## 2010-10-01 LAB — PROTIME-INR: Prothrombin Time: 35.5 seconds — ABNORMAL HIGH (ref 11.6–15.2)

## 2010-10-01 LAB — BASIC METABOLIC PANEL
BUN: 11 mg/dL (ref 6–23)
Calcium: 10.1 mg/dL (ref 8.4–10.5)
GFR calc non Af Amer: >60 mL/min (ref >60)
Glucose, Bld: 92 mg/dL (ref 70–99)
Sodium: 140 mEq/L (ref 135–145)

## 2010-10-02 LAB — CBC
MCH: 27.7 pg (ref 26.0–34.0)
MCV: 86.7 fL (ref 78.0–100.0)
Platelets: 243 10*3/uL (ref 150–400)
RDW: 15.6 % — ABNORMAL HIGH (ref 11.5–15.5)
WBC: 6.6 10*3/uL (ref 4.0–10.5)

## 2010-10-02 LAB — BASIC METABOLIC PANEL
BUN: 8 mg/dL (ref 6–23)
Calcium: 10.4 mg/dL (ref 8.4–10.5)
GFR calc non Af Amer: >60 mL/min (ref >60)
Glucose, Bld: 88 mg/dL (ref 70–99)

## 2010-10-03 ENCOUNTER — Inpatient Hospital Stay (HOSPITAL_COMMUNITY): Payer: Medicare Other

## 2010-10-03 LAB — CULTURE, BLOOD (ROUTINE X 2)
Culture  Setup Time: 201208142350
Culture: NO GROWTH

## 2010-10-04 LAB — PROTIME-INR: INR: 2.49 — ABNORMAL HIGH (ref 0.00–1.49)

## 2010-10-05 LAB — URINALYSIS, MICROSCOPIC ONLY
Glucose, UA: NEGATIVE mg/dL
Protein, ur: 30 mg/dL — AB
pH: 6 (ref 5.0–8.0)

## 2010-10-05 LAB — COMPREHENSIVE METABOLIC PANEL
ALT: 9 U/L (ref 0–35)
AST: 17 U/L (ref 0–37)
CO2: 29 mEq/L (ref 19–32)
Calcium: 10.9 mg/dL — ABNORMAL HIGH (ref 8.4–10.5)
Chloride: 101 mEq/L (ref 96–112)
GFR calc non Af Amer: >60 mL/min (ref >60)
Sodium: 136 mEq/L (ref 135–145)
Total Bilirubin: 0.4 mg/dL (ref 0.3–1.2)

## 2010-10-05 LAB — CBC
HCT: 34.5 % — ABNORMAL LOW (ref 36.0–46.0)
Hemoglobin: 11.2 g/dL — ABNORMAL LOW (ref 12.0–15.0)
WBC: 6.8 10*3/uL (ref 4.0–10.5)

## 2010-10-05 LAB — PROTIME-INR: INR: 2.07 — ABNORMAL HIGH (ref 0.00–1.49)

## 2010-10-06 ENCOUNTER — Inpatient Hospital Stay (HOSPITAL_COMMUNITY): Payer: Medicare Other

## 2010-10-06 LAB — BASIC METABOLIC PANEL
BUN: 5 mg/dL — ABNORMAL LOW (ref 6–23)
CO2: 28 mEq/L (ref 19–32)
Calcium: 10.7 mg/dL — ABNORMAL HIGH (ref 8.4–10.5)
Creatinine, Ser: 0.5 mg/dL (ref 0.50–1.10)
GFR calc non Af Amer: >60 mL/min (ref >60)
Glucose, Bld: 128 mg/dL — ABNORMAL HIGH (ref 70–99)

## 2010-10-06 LAB — URINE MICROSCOPIC-ADD ON

## 2010-10-06 LAB — CBC
MCH: 27.7 pg (ref 26.0–34.0)
MCHC: 32.1 g/dL (ref 30.0–36.0)
MCV: 86.3 fL (ref 78.0–100.0)
Platelets: 199 10*3/uL (ref 150–400)
RDW: 15.4 % (ref 11.5–15.5)
WBC: 5.6 10*3/uL (ref 4.0–10.5)

## 2010-10-06 LAB — URINALYSIS, ROUTINE W REFLEX MICROSCOPIC
Glucose, UA: NEGATIVE mg/dL
Protein, ur: NEGATIVE mg/dL
Specific Gravity, Urine: 1.015 (ref 1.005–1.030)

## 2010-10-06 NOTE — H&P (Signed)
Leah [name], Leah [name]                 ACCOUNT NO.:  [id number]  MEDICAL RECORD NO.:  [id number]  LOCATION:  3307                         FACILITY:  [facility]  PHYSICIAN:  Larina Earthly, M.D.        DATE OF BIRTH:  11-20-1927  DATE OF ADMISSION:  09/27/2010 DATE OF DISCHARGE:                             HISTORY & PHYSICAL   CHIEF COMPLAINT:  Shortness of breath with dry cough.  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  Severe osteoarthritis, right toe fracture, physical deconditioning, atrial fibrillation on anticoagulation with good rate control, and history of clinically compensated congestive heart failure.  She was discharged to [facility] place for rehabilitation efforts pending possible revision of her left knee hardware with chronic infection.  She was performing fairly well per family report, actually up on her feet, with significant assistance able to go to a bedside commode, eating fairly well, some complications with anxiety requesting Cymbalta and her benzodiazepines, but otherwise performing well, moving her bowels with no significant shortness of breath or respiratory distress.  Please note that her most recent hospitalization was complicated by Pseudomonas sepsis based on blood culture reports requiring pressors, but again no intubation.  Over the last 24 hours, the patient developed progressive shortness of breath, dry hacky cough, questionable orthopnea based on family report, decreased caloric intake.  Despite multiple nebulizers, her symptoms progressed with diaphoresis and she presented to the emergency room.  In the emergency room, she had increased work of breathing,  respiratory rate with continued shortness of breath and dry cough.  She was questionably orthopneic.  Her white blood cell count was 14,000 above her baseline of 9000-10,000.  Chest x-ray consistent with bilateral pneumonia compared with the end of July, 2012.  BNP was not obtained in the emergency room.  Triple antibiotics consisting of Zosyn, vancomycin, and Cipro were ordered given her recent hospitalization and rehab stay as well as Pseudomonas pneumonia.  Unfortunately, blood cultures were not ordered prior to the initiation of IV antibiotics here in the emergency room.  I was called to admit.  REVIEW OF SYSTEMS:  Negative for fevers or chills, but positive for sweats.  Negative for chest pain.  Positive for shortness of breath, questionable orthopnea.  Negative for nausea or vomiting.  Negative for blood in stool or urine.  Positive for increasing anxiety, desiring to reinitiate Cymbalta as she has been on in the past as well as her current benzodiazepines.  PROBLEM LIST: 1. Recent Pseudomonas pneumonia complicated by sepsis in July 2012     requiring pressors. 2. 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 Almira Coaster. 3. Severe osteoarthritis. 4. Recent subacute right toe fracture, on hard boot. 5. Hypercalcemia, presumably secondary to hyperparathyroidism which is     mild. 6. Physical deconditioning. 7. Atrial  fibrillation on anticoagulation, rate controlled. 8. Congestive heart failure, clinically compensated with baseline, BNP     of approximately 1200. 9. History of left total knee replacement and subsequent infection     with coag-negative staph. 10.Hypothyroidism. 11.Hypertension. 12.Restless legs syndrome. 13.Hyperlipidemia. 14.Vitamin D deficiency. 15.Anxiety with depression.  MEDICATIONS:  At the skilled nursing facility include regular, no  added salt, sweets diet with skin milk.  From discharge summary was vitamin D 50,000 international units weekly, ReQuip 1 mg p.o. at bedtime, Synthroid 125 mcg each day, simvastatin 20 mg daily, Protonix 40 mg daily, Coumadin per pharmacy protocol varying between 1.25-2.5 mg daily, Xanax 0.5 mg p.o. nightly, diltiazem extended release 180 mg daily, Keflex 500 mg p.o. q.i.d., Lopressor 50 mg p.o. b.i.d. hold for systolic blood pressure less than 100 or heart rate less than 60, Tylenol 650 mg p.o. q.6 h. p.r.n., nutritional supplement b.i.d. per facility protocol, regular diet, Vicodin 1-2 tablets p.o. q.6 h. p.r.n.  Please note the patient does have a history of altered mental status and oversedation with excessive amounts of narcotics.  The patient has a history of using irbesartan 300 mg daily was discontinued and the patient does have a history of using Lasix 40-80 mg prior to hospitalization and the patient was also taking Neurontin 300 mg p.o. nightly prior to hospitalization.  SOCIAL HISTORY:  The patient lives in [place] currently at [facility], but does have sons who attend to her needs.  She is retired, nonsmoker, nondrinker.  FAMILY HISTORY:  Noncontributory at this point.  The patient is a full code.  CURRENT LABORATORY EVALUATION:  BNP pending.  Cardiac enzymes pending. Chest x-ray reveals interval development of bilateral patchy pneumonic infiltrates compared to September 12, 2010.  White blood cell count 14,000, hemoglobin 10.4, hematocrit 32.5%, and platelet count 364.  Sodium 140, potassium 3.5, chloride 102, BUN 10, creatinine 0.91, calcium normal now at 10.2, however, presumably this may be corrected to be elevated once albumin is considered.  Glucose 230.  EKG reveals bradycardia, questionable junctional escape rhythm in the setting of using beta- blockers and calcium channel blockers.  PHYSICAL EXAMINATION:  GENERAL:  We have a morbidly obese Caucasian female,  sitting up in bed, answering all questions appropriately, alert and oriented x3, but with some increased work of breathing with face mask in place. HEENT:  Face is symmetric.  Tongue is midline. NECK:  Supple.  There is no cervical lymphadenopathy. VITAL SIGNS:  Temperature 97.9 degrees orally, oxygen saturation 97% with face mask, blood pressure 103/42, and respirations 18-24. CARDIOVASCULAR:  Regular rate, albeit bradycardic with no murmurs appreciated in the emergency room. LUNGS:  Decreased breath sounds with questionable rales at the bases, otherwise clear at the top. ABDOMEN:  A soft, nontender, and nondistended abdomen.  Bowel sounds are present. EXTREMITIES:  Trace edema.  Pedal pulses are intact, but somewhat diminished.  Venous insufficiency changes are present.  Examination of the major joints revealed no active synovitis, some decrease in range of motion with respect to the left knee.  No evidence of cyanosis.  ASSESSMENT/PLAN: 1. Bilateral pneumonia with a history of recent Pseudomonas sepsis     requiring pressors.  We will continue broad-spectrum antibiotics     that were started in the emergency room.  Unfortunately blood     cultures were ordered after IV antibiotics initiated.  We will     support with pulmonary toilet and oxygen as needed and we will     follow serial chest x-rays  and clinical evidence for improvement,     concerning is a question of congestive heart failure given her     orthopnea. 2. Congestive heart failure, clinically compensated at the time of     discharge.  Chest x-ray read out as bilateral pulmonary infiltrates     consistent with infection versus heart failure.  BNP will be     assessed along with cardiac enzymes given recent diaphoresis,     possibly related to temperature elevation. 3. Paroxysmal atrial fibrillation, currently on Coumadin.  We will     continue the same.  No bleeding complications noted.  We will     continue calcium  channel blocker in short-acting form for increased     blood pressure or heart rate.  Currently, the patient is     bradycardic with a marginal blood pressure. 4. Anxiety.  We will continue Xanax p.r.n. 5. Osteoarthritis.  We will continue Vicodin p.r.n., however, please     note that the patient does have a history of altered mental status     and increased sedation with excessive use.     Larina Earthly, M.D.     RA/MEDQ  D:  09/27/2010  T:  09/27/2010  Job:  [id number]  cc:   Georga Hacking, M.D.  Electronically Signed by Larina Earthly M.D. on 10/06/2010 07:58:08 AM

## 2010-10-06 NOTE — Discharge Summary (Signed)
NAMEJEFFERY, Leah [name]                 ACCOUNT NO.:  [id number]  MEDICAL RECORD NO.:  [id number]  LOCATION:  2027                         FACILITY:  [facility]  PHYSICIAN:  Larina Earthly, M.D.        DATE OF BIRTH:  03-Dec-1927  DATE OF ADMISSION:  09/03/2010 DATE OF DISCHARGE:  09/19/2010                        DISCHARGE SUMMARY - REFERRING   DISCHARGE DIAGNOSES: 1. Pseudomonas pneumonia complicated by sepsis requiring pressors, but     no intubation. 2. Left knee infection with coag-negative staph on prophylaxis     indefinitely until surgery. 3. Severe osteoarthritis. 4. Right toe fracture. 5. Hypercalcemia, presumably secondary to hyperparathyroidism, mild. 6. Physical deconditioning. 7. Atrial fibrillation on anticoagulation, rate controlled. 8. Congestive heart failure, clinically compensated.  SECONDARY DIAGNOSES: 1. Osteoarthritis, status post left total knee replacement and     subsequent infection with coag-negative staph. 2. Hypothyroidism. 3. Hypertension. 4. Restless legs syndrome. 5. Hyperlipidemia. 6. Vitamin D deficiency.  DISCHARGE MEDICATIONS: 1. Vitamin D 50,000 international units weekly. 2. Requip 1 mg p.o. at bedtime. 3. Synthroid 125 mcg each day. 4. Simvastatin 20 mg daily. 5. Protonix 40 mg daily. 6. Coumadin per pharmacy protocol, apparently varying between 1.25 and     2.5 mg daily. 7. Xanax 0.5 mg p.o. at bedtime. 8. Diltiazem extended release 180 mg once daily. 9. Keflex 500 mg p.o. q.i.d. 10.Lopressor 50 mg p.o. b.i.d. hold for systolic blood pressure less     than 100 or heart rate less than 60. 11.Tylenol 650 mg q.6 h p.r.n. 12.Nutritional supplements b.i.d. per facility protocol. 13.Regular diet. 14.Vicodin 1-2 tablets q.6 h p.r.n.  Please note that the patient has a history of using Lipitor 10 mg in place of simvastatin and also the patient has a history of using Irbesartan 300 mg daily.  However, this was changed over to calcium channel  blocker secondary to increased heart rate during hospitalization.  Please also note that the patient has been on diuretics consisting of Lasix 40-80 mg prior to hospitalization.  The patient was also taking Neurontin 300 mg p.o. at bedtime prior to hospitalization.  RADIOLOGY:  Images right foot on September 15, 2010 reveals arthritic changes in the mid foot and toes, partially healed fracture of the proximal phalangeal bone of the fourth toe.  Chest x-ray from September 12, 2010 reveals no significant change, but continued right midlung airspace disease, likely infection with moderately severe, but at that time no pneumothorax, normal heart size, no pleural fluid, and no evidence of volume overload.  Please note that admission chest x-ray revealed stable cardiac enlargement, chronic lung changes, and areas of scarring, possible right perihilar infiltrate.  Remote x-rays of the left knee revealed total knee arthroplasty with spacer with no evidence of hardware loosening or fracture, and this was actually obtained on May 26, 2010.  DISCHARGE LABS:  PT/INR 2.67.  Most recent CBC reveals a hemoglobin 9.7, hematocrit 30.5%, platelet count 434, white blood cell count 8.9. Sodium 136, potassium 3.5, BUN 15, creatinine 0.6, glucose 102, serum CO2 of 33, AST 18, ALT 12, alkaline phosphatase 100, total bilirubin 0.3, albumin 1.9.  Most recent calcium 10.4.  Blood culture x2 on September 07, 2010  reveals no growth.  Blood cultures on September 03, 2010 revealed a Pseudomonas aeruginosa.  Uric acid level was 3.5.  Intact parathyroid hormone was elevated at 74.8 in the setting of calcium of 10.0 with low albumin.  SPEP and UPEP unremarkable for any evidence of significant multiple myeloma.  PTH related peptide pending.  Most recent BNP was 1210 when she was thought to be clinically compensated with respect to heart failure.  HISTORY OF PRESENT ILLNESS:  Please see history and physical dictated by my partner, Dr.  Guerry Bruin on September 03, 2010.  However, this is an 77 year old Caucasian female with multiple medical comorbidities and longstanding issues with respect to left knee replacement several years back.  Recent cultures grew out Coag staph negative coming from the knee per the patient's and family's report and she has received multiple rounds of oral antibiotics.  One lasting 2 weeks and another lasting 4 weeks without resolution of her infection including pain.  They have been followed locally by Dr. Jerl Santos as well as by Dr. Almyra Free at [facility] who during the early part of July gave them options with respect to replacement of the total knee versus fusion in the setting of her multiple comorbidities and decreased mobility as well as lack of pain control.  She was given approximately 6 weeks to consider this per her family report, however, the patient presented to the hospital on the day of admission feeling somewhat dizzy and weak and diaphoretic and found to be in rapid AFib, which responded to intravenous calcium channel blocker.  She was initially seen by Cardiology, but given the fact that she had multiple comorbidities including what looked like sepsis or infection coming from the knee, she was admitted to our service given diaphoresis and active fevers in the emergency room.  HOSPITAL COURSE: 1. The patient became hemodynamically unstable, developed recurrent     fevers and low blood pressure with cultures revealing Pseudomonas     pneumoniae, actually thought to be secondary to pneumonia versus     her knee.  She was placed on broad-spectrum antibiotics.  She did     require ICU stay requiring pressors and BiPAP briefly, but not     intubation.  She recovered with supportive care and management and     was transferred to the floor.  She remained on broad-spectrum     antibiotics, the last of which consisted of Zosyn for approximately     2 weeks, discontinued on September 17, 2010.  She then started     prophylaxis for her left knee consisting of Keflex 500 mg q.i.d.     indefinitely.  She was followed by Infectious Disease during her     hospitalization for the same.  She continued to have significant issues with severe osteoarthritis, pain from the left knee, as well as surprisingly from the right foot of unclear etiology.  She did receive finally Tylenol, but also Vicodin for pain management.  This was complicated by altered mental status as a result of the narcotic administration with a reduction in her Vicodin from two down to one complicated by her recent sepsis.  Mental status cleared once narcotic wore off.  She did complain of right foot pain that was worsening during the latter part of her hospitalization.  X-ray did reveal subacute fractures mentioned above and she was placed in a boot with significant relief.  1. Atrial fibrillation continued to be followed by Cardiology and her  calcium channel blocker was adjusted.  Please note that she was on     ARB prior to hospitalization and this was transferred to a calcium     channel blocker.  On only one occasion was her Lopressor held     secondary to low heart rate.  1. History of congestive heart failure, clinically compensated with  her last baseline BNP being 1210.  She remains on oxygen.  1. Hypercalcemia of unclear etiology, but with SPEP and UPEP being     unremarkable and elevated PTH in the setting of a low albumin, she     is thought to have a form of hyperparathyroidism that may be     amenable to intravenous bisphosphonates in the near future.  1. She was evaluated by physical and occupational therapy, who thought     that she would further benefit from a rehab facility.  This will be     facilitated on September 19, 2010 and please note that in this rehab     facility, she will need to continue Coumadin per pharmacy protocol,     PT/OT consults, continue boot for supportive  measurements with     respect to her right foot and she will need a repeat chest x-ray in     approximately 2 weeks.  Foley will be attempted to be discontinued     prior to transfer.  She will also need a followup with Dr. Almyra Free     at [facility] within the next 2 weeks for further disposition     with respect to her left knee.     Larina Earthly, M.D.     RA/MEDQ  D:  09/19/2010  T:  09/19/2010  Job:  [id number]  cc:   Georga Hacking, M.D. Lubertha Basque Jerl Santos, M.D. Dr. Almyra Free  Electronically Signed by Larina Earthly M.D. on 10/06/2010 07:58:03 AM

## 2010-10-07 LAB — PROTIME-INR
INR: 2.39 — ABNORMAL HIGH (ref 0.00–1.49)
Prothrombin Time: 26.5 seconds — ABNORMAL HIGH (ref 11.6–15.2)

## 2010-10-08 LAB — BASIC METABOLIC PANEL
CO2: 28 mEq/L (ref 19–32)
Glucose, Bld: 101 mg/dL — ABNORMAL HIGH (ref 70–99)
Potassium: 4.1 mEq/L (ref 3.5–5.1)
Sodium: 137 mEq/L (ref 135–145)

## 2010-10-08 LAB — CBC
Hemoglobin: 10.3 g/dL — ABNORMAL LOW (ref 12.0–15.0)
RBC: 3.74 MIL/uL — ABNORMAL LOW (ref 3.87–5.11)
WBC: 5.7 10*3/uL (ref 4.0–10.5)

## 2010-10-08 LAB — PROTIME-INR: INR: 2.63 — ABNORMAL HIGH (ref 0.00–1.49)

## 2010-10-09 LAB — PROTIME-INR: Prothrombin Time: 28.8 seconds — ABNORMAL HIGH (ref 11.6–15.2)

## 2010-10-10 LAB — COMPREHENSIVE METABOLIC PANEL
AST: 10 U/L (ref 0–37)
Albumin: 2.4 g/dL — ABNORMAL LOW (ref 3.5–5.2)
Calcium: 10.2 mg/dL (ref 8.4–10.5)
Chloride: 103 mEq/L (ref 96–112)
Creatinine, Ser: 0.47 mg/dL — ABNORMAL LOW (ref 0.50–1.10)
Total Bilirubin: 0.3 mg/dL (ref 0.3–1.2)
Total Protein: 5.6 g/dL — ABNORMAL LOW (ref 6.0–8.3)

## 2010-10-10 LAB — CBC
Hemoglobin: 9.9 g/dL — ABNORMAL LOW (ref 12.0–15.0)
MCHC: 32.2 g/dL (ref 30.0–36.0)
Platelets: 200 10*3/uL (ref 150–400)
RBC: 3.64 MIL/uL — ABNORMAL LOW (ref 3.87–5.11)

## 2010-10-10 LAB — DIFFERENTIAL
Basophils Absolute: 0 10*3/uL (ref 0.0–0.1)
Basophils Relative: 0 % (ref 0–1)
Eosinophils Absolute: 0.2 10*3/uL (ref 0.0–0.7)
Monocytes Absolute: 0.6 10*3/uL (ref 0.1–1.0)
Neutro Abs: 4.8 10*3/uL (ref 1.7–7.7)
Neutrophils Relative %: 68 % (ref 43–77)

## 2010-10-10 LAB — PROTIME-INR
INR: 2.66 — ABNORMAL HIGH (ref 0.00–1.49)
Prothrombin Time: 28.8 seconds — ABNORMAL HIGH (ref 11.6–15.2)

## 2010-10-10 NOTE — Consult Note (Signed)
NAMECHENEE, Leah [name] NO.:  [id number]  MEDICAL RECORD NO.:  [id number]  LOCATION:  3307                         FACILITY:  [facility]  PHYSICIAN:  Corky Crafts, MDDATE OF BIRTH:  [birth year]  DATE OF CONSULTATION:  09/28/2010 DATE OF DISCHARGE:                                CONSULTATION   REFERRING PHYSICIAN:  Larina Earthly, MD  PRIMARY CARDIOLOGIST:  Georga Hacking, MD  REASON FOR CONSULTATION:  Shortness of breath.  HISTORY OF PRESENT ILLNESS:  The patient is an 77 year old who has chronic atrial fibrillation.  She presented to the hospital with shortness of breath.  She also had a fever and cough.  Her clinical course was worrisome for pneumonia.  She also had what was thought to be edema as well as effusions on her chest x-ray.  She was started on antibiotics in the emergency room.  Of note, she had been hospitalized several weeks earlier and discharged after being well compensated from a cardiac standpoint.  We are asked to evaluate for congestive heart failure as a possible cause of her shortness of breath.  She is currently receiving IV antibiotics.  Her chest x-ray has improved with better aeration noted.  She received one dose of Lasix, but she is not receiving any regular Lasix at this point.  Her heart rate has been well controlled.  She has not been receiving rate control medications. Her blood pressure has been low and she has been receiving dopamine for hemodynamic support.  Her pleural effusions did persist on today's most recent chest x-ray.  PAST MEDICAL HISTORY: 1. Chronic atrial fibrillation. 2. Hypothyroidism. 3. Hypertension. 4. Infected left knee prosthesis.  MEDICATIONS IN THE HOSPITAL: 1. Albuterol. 2. Cipro. 3. Cymbalta. 4. Guaifenesin. 5. Levothyroxine. 6. Protonix 40 mg daily. 7. Zosyn. 8. MiraLax. 9. Vancomycin.  ALLERGIES: 1. CODEINE. 2. DOXYCYCLINE. 3. BENADRYL.  SOCIAL HISTORY:  She lives in [place].   She does not smoke.  FAMILY HISTORY:  Noncontributory.  REVIEW OF SYSTEMS:  Significant for shortness of breath, which has improved significantly after coming into the hospital.  She has some ankle edema.  No bleeding problems.  No focal weakness.  She is generally weak.  All other systems negative.  PHYSICAL EXAMINATION:  VITAL SIGNS:  Blood pressure 107/51, heart rate ranged from 80-90. GENERAL:  She is awake, alert, no apparent distress. HEAD:  Normocephalic, atraumatic. EYES:  Extraocular movements intact. NECK:  No JVD. CARDIOVASCULAR:  Irregularly irregular rhythm, regular rate. LUNGS:  No wheezing.  Decreased breath sounds at the bases. ABDOMEN:  Soft, nontender. EXTREMITIES:  Showed bilateral ankle edema.  LAB WORK:  Shows potassium 3.0, creatinine 1.0.  BNP 3550 which is up from 1200 back a few weeks ago.  LFTs are normal.  Hemoglobin 9.9.  ASSESSMENT/PLAN: 1. An 77 year old with chronic diastolic heart failure related to age,     and atrial fibrillation is likely playing a role in her shortness     of breath.  I would diurese her as tolerated.  Currently, her blood     pressure as low and pressors are required.  Wean dopamine as     tolerated. 2. Atrial  fibrillation.  Her heart rate is controlled despite     dopamine.  Medications are on hold while her blood pressure is low.     Coumadin for stroke prevention. 3. Given that her BNP has increased from the end of June, diastolic     heart failure is likely playing a role in her shortness of breath. 4. We will follow. 5. Continue treatment for pneumonia.     Corky Crafts, MD     JSV/MEDQ  D:  09/28/2010  T:  09/29/2010  Job:  [id number]  Electronically Signed by Lance Muss MD on 10/10/2010 02:00:15 PM

## 2010-10-11 LAB — CBC
HCT: 32.8 % — ABNORMAL LOW (ref 36.0–46.0)
Hemoglobin: 10.7 g/dL — ABNORMAL LOW (ref 12.0–15.0)
MCH: 27.5 pg (ref 26.0–34.0)
MCV: 84.3 fL (ref 78.0–100.0)
RBC: 3.89 MIL/uL (ref 3.87–5.11)
WBC: 6.3 10*3/uL (ref 4.0–10.5)

## 2010-10-11 LAB — COMPREHENSIVE METABOLIC PANEL
ALT: 8 U/L (ref 0–35)
Alkaline Phosphatase: 71 U/L (ref 39–117)
BUN: 6 mg/dL (ref 6–23)
CO2: 29 mEq/L (ref 19–32)
Chloride: 102 mEq/L (ref 96–112)
GFR calc Af Amer: >60 mL/min (ref >60)
Glucose, Bld: 95 mg/dL (ref 70–99)
Potassium: 4 mEq/L (ref 3.5–5.1)
Sodium: 137 mEq/L (ref 135–145)
Total Bilirubin: 0.4 mg/dL (ref 0.3–1.2)

## 2010-10-12 LAB — PROTIME-INR: Prothrombin Time: 27.1 seconds — ABNORMAL HIGH (ref 11.6–15.2)

## 2010-10-13 LAB — BASIC METABOLIC PANEL
BUN: 6 mg/dL (ref 6–23)
Chloride: 105 mEq/L (ref 96–112)
GFR calc Af Amer: >60 mL/min (ref >60)
Potassium: 3.9 mEq/L (ref 3.5–5.1)
Sodium: 139 mEq/L (ref 135–145)

## 2010-10-13 LAB — CBC
MCH: 27.4 pg (ref 26.0–34.0)
MCHC: 32.2 g/dL (ref 30.0–36.0)
MCV: 85.3 fL (ref 78.0–100.0)
Platelets: 223 10*3/uL (ref 150–400)

## 2010-10-14 NOTE — Consult Note (Signed)
  NAMEALEC, MCPHEE NO.:  [id number]  MEDICAL RECORD NO.:  [id number]  LOCATION:  4737                         FACILITY:  [facility]  PHYSICIAN:  Danae Chen, M.D.  DATE OF BIRTH:  February 23, 1927  DATE OF CONSULTATION:  10/06/2010 DATE OF DISCHARGE:                                CONSULTATION   REASON FOR CONSULTATION:  Inability to urinate.  The patient is an 77 year old female who was in the hospital about a month ago for pneumonia.  A Foley catheter was then inserted in the bladder.  She was discharged to a rehab center on September 19, 2010, without the Foley catheter.  However, the catheter had to be replaced at the rehab. center.  She was readmitted about 9 days ago for shortness of breath. The Foley catheter was removed last night and she has been unable to urinate.  The Foley catheter was then reinserted today and I was asked to see the patient in consultation for Foley catheter management.  PAST MEDICAL HISTORY:  Positive for recent Pseudomonas pneumonia complicated by sepsis a month ago.  She has a chronic left knee infection.  She has osteoarthritis, atrial fibrillation, congestive heart failure, hypothyroidism, hypertension, hyperlipidemia, vitamin D deficiency.  PAST SURGICAL HISTORY:  She had left knee replacement done 3 times.  SOCIAL HISTORY:  She does not smoke, does not drink.  She has 5 children.  FAMILY HISTORY:  Her father died of leukemia.  Her mother died of heart disease at age 92.  MEDICATIONS: 1. Albuterol. 2. Cymbalta. 3. Guaifenesin. 4. Levothyroxine. 5. Protonix. 6. MiraLAX.  ALLERGIES:  She is allergic to CODEINE, DOXYCYCLINE, and BENADRYL.  REVIEW OF SYSTEMS:  As noted in the HPI and everything else is negative.  PHYSICAL EXAMINATION:  GENERAL:  This is an alert 77 year old female who is in no acute distress. VITAL SIGNS:  Her blood pressure is 138/82, pulse 99, respirations 18, temperature 98.8. SKIN:  Warm and  dry. ABDOMEN:  Soft.  She has mild tenderness in the periumbilical area.  She has no CVA tenderness.  Kidneys are not palpable.  She has no hepatomegaly, no splenomegaly.  Bladder is not distended.  She has no inguinal hernia. GENITALIA:  She has normal female genitalia.  She has a Foley catheter that is draining clear urine.  Her BUN is 5, creatinine 0.50.  Hemoglobin is 10.3, hematocrit 32.1, and WBC 5.6.  IMPRESSION:  Urinary retention, chronic left knee infection, atrial fibrillation.  SUGGESTIONS:  Leave Foley catheter indwelling for now.  Start Flomax 0.4 mg daily and Urecholine 10 mg every 6 hours.  Thank you.  We will follow the patient with you.     Danae Chen, M.D.     MN/MEDQ  D:  10/06/2010  T:  10/07/2010  Job:  [id number]  Electronically Signed by Lindaann Slough M.D. on 10/14/2010 12:03:04 PM

## 2010-11-04 LAB — CBC
HCT: 39.2
Hemoglobin: 13.6
RDW: 14.9

## 2010-11-04 LAB — DIFFERENTIAL
Basophils Absolute: 0
Basophils Relative: 1
Neutro Abs: 2.9
Neutrophils Relative %: 52

## 2010-11-04 LAB — COMPREHENSIVE METABOLIC PANEL
Alkaline Phosphatase: 95
BUN: 11
Chloride: 99
GFR calc non Af Amer: >60
Glucose, Bld: 143 — ABNORMAL HIGH
Potassium: 3.5
Total Bilirubin: 0.9

## 2010-11-04 LAB — POTASSIUM: Potassium: 4.3

## 2010-11-04 LAB — PROTIME-INR
INR: 2.2 — ABNORMAL HIGH
Prothrombin Time: 24.7 — ABNORMAL HIGH
Prothrombin Time: 34.3 — ABNORMAL HIGH

## 2010-11-08 LAB — CBC
HCT: 38.6
Platelets: 245
RDW: 14.3

## 2010-11-08 LAB — BASIC METABOLIC PANEL
BUN: 11
Calcium: 9.3
Creatinine, Ser: 1.05
GFR calc non Af Amer: 51 — ABNORMAL LOW
Glucose, Bld: 110 — ABNORMAL HIGH

## 2010-11-08 LAB — PROTIME-INR: INR: 2.5 — ABNORMAL HIGH

## 2010-11-09 NOTE — Discharge Summary (Signed)
NAMECYRIAH, Leah [name]                 ACCOUNT NO.:  [id number]  MEDICAL RECORD NO.:  [id number]  LOCATION:  4737                         FACILITY:  [facility]  PHYSICIAN:  Larina Earthly, M.D.        DATE OF BIRTH:  1927/03/29  DATE OF ADMISSION:  09/27/2010 DATE OF DISCHARGE:  10/13/2010                        DISCHARGE SUMMARY - REFERRING   DISCHARGE DIAGNOSES: 1. Bladder retention. 2. C. difficile colitis. 3. Pneumonia, right middle lobe. 4. Atrial fibrillation. 5. Diastolic dysfunction with congestive heart failure. 6. Physical deconditioning. 7. Osteoarthritis with a chronic left knee infection with coag-     negative staph. 8. Anticoagulation with no bleeding complications. 9. History of hypercalcemia consistent with hyperparathyroidism. 10.Anxiety and depression. 11.Hypothyroidism. 12.Hypertension. 13.Restless legs syndrome. 14.Vitamin D deficiency.  DISCHARGE MEDICATIONS: 1. Synthroid 125 mcg each day. 2. Coumadin per pharmacy protocol. 3. Cymbalta 30 mg daily. 4. Potassium chloride 20 mEq p.o. b.i.d. 5. Flagyl 500 mg p.o. t.i.d. x1 additional week. 6. Albuterol nebulizers twice daily. 7. Vancomycin 500 mg p.o. t.i.d. x1 additional week. 8. Flora-Q one p.o. daily or Align per skilled nursing facility     protocol. 9. Pepcid 20 mg p.o. b.i.d.  Please note that this is in place of     Protonix given treatment for C. difficile colitis. 10.Flomax 0.4 mg daily. 11.Diltiazem extended release 180 mg daily. 12.Coumadin per pharmacy protocol. 13.Nutritional supplements b.i.d. 14.Xanax 0.5 mg q.6 hours p.r.n. 15.Vicodin 1-2 tabs p.o. q.6 hours p.r.n. 16.Ultram 50 mg p.o. q.8 hours for minor pain. 17.Please note that patient does have history of using simvastatin or     Lipitor for her hyperlipidemia.  Most recent labs, white blood cell count 6.3, hemoglobin 10.7, hematocrit 32.8%, platelet count 215.  PT/INR 2.46.  Sodium 137, potassium 4.0, serum CO2 of 29, BUN 6, creatinine  0.57, GFR greater than 60, AST 11, ALT 8, alkaline phosphatase 71, total bilirubin 0.4, albumin 2.7, total protein 5.9, and calcium 10.1.  Chest x-ray from October 06, 2010, reveals persistent, but decreased right middle lobe lung opacity and small effusions.  HISTORY OF PRESENT ILLNESS:  Please see history and physical dictated by myself on September 27, 2010, for details.  However, this is an 77 year old Caucasian female who was hospitalized from September 03, 2010, through September 19, 2010, for Pseudomonas pneumonia complicated by sepsis, requiring pressors with no intubation, chronic left knee infection with coag- negative staph on prophylaxis indefinitely pending surgery.  She does have her severe osteoarthritis right toe fracture and physical deconditioning as well as atrial fibrillation on anticoagulation with good rate control and a history of clinically compensated congestive heart failure.  She was discharged to [facility] for further rehabilitation efforts, pending possible revision of her left knee hardware with chronic infection.  She was performing fairly well. However, on the 1-2 days prior to her hospitalization, she developed progressive issues with shortness of breath, dry hacking cough, questionable orthopnea, and decreased caloric intake.  She presented to the emergency room where she was found to have all elevated white blood cell count, a chest x-ray considered with bilateral pneumonia compared with July 2012.  BNP was not obtained in the emergency room and triple  antibiotics consisting of Zosyn, vancomycin, and Cipro were ordered by the ER physician given her recent hospitalization rehab stay, but unfortunately blood cultures were not obtained prior to the initiation of IV antibiotics.  I subsequently admitted the patient.  HOSPITAL COURSE: 1. Pneumonia, resolved clinically with decreased cough, decrease work     of breathing and improvement clinically without any  respiratory     distress, shortness of breath, or significant cough.  Chest x-ray     improved radiologically.  White blood cell count normalized,     however, her course was complicated by worsening diarrhea     consistent with C. difficile colitis. 2. The patient did develop C. difficile colitis, this was confirmed by     PCR.  She was placed on Flagyl 500 mg p.o. t.i.d. along with     vancomycin 500 t.i.d. which was initiated on October 02, 2010,     through October 03, 2010.  Her symptoms quickly defervesced and     resolved with only one soft bowel movement by October 11, 2010.  It     was thought prudent to continue treatment for an additional week.     Cholestyramine was discontinued.  She had no further symptoms     getting another C. difficile PCR test would be useless given the     fact that it will be positive for prolonged period of time despite     clinical resolution. 3. The patient did develop bladder retention during this     hospitalization and failed multiple attempts at removing Foley     catheter.  Dr. Brunilda Payor did consult and placed her on Urecholine as     well as Flomax, this did result in significant bladder spasm     presumably secondary to the Urecholine, however, given 3-4 days of     treatment, Dr. Brunilda Payor felt that she would benefit from a voiding     trial, this was performed on October 11, 2010, and throughout the     next 24 hours, she was voiding on her own.  She was maintained on     Flomax, Ditropan which was also started was discontinued.  She     never developed any renal parameters consistent with obstruction. 4. The patient did maintain atrial fibrillation and this was     controlled with calcium channel blockade, initially with short-     acting agents than long-acting agents.  She did have some     breakthrough increased heart rate which was controlled with     readministration of the calcium-channel blocker.  She was followed     by Dr. Donnie Aho and  colleagues during her hospitalization.  She never     developed any signs or symptoms of significant congestive heart     failure.  However, she did require diuresis with Lasix given her     diastolic dysfunction on occasion.  Her baseline BNP is between 600     and 1200.  She is for the most part oxygen-dependent on 2 liters by     nasal cannula. 5. She did and continues to have significant physical deconditioning     which is thought to be improved with Physical and Occupational     Therapy which will need to be continued at [facility] or other     skilled nursing facility. 6. The patient does continue to have a chronic left knee infection     with coag-negative staph and will  need to be maintained on Keflex     500 mg q.i.d. indefinitely until any questionable surgery by Dr.     Almyra Free at [facility] in the future. 7. Anticoagulation.  The patient did not have any significant issues     with bleeding complications and maintained therapeutic PT/INR for     most of her hospitalization. 8. History of hypercalcemia with SPEP and UPEP negative and labs     consistent with mild hyperparathyroidism questioned whether she     would benefit from intravenous bisphosphonate therapy in the future     if her calcium level raise significantly pain. 9. Anxiety.  The patient does have significant anxiety and she is     currently on Cymbalta 30 mg and may benefit from increasing to 60     mg in the future.  She is using alprazolam p.r.n. 10.Hypothyroidism with TSH being normal during most recent     hospitalization. 11.Hypertension, well controlled.  DISPOSITION:  The patient will be discharged hopefully back [facility] in the next 1-2 days, to continue her rehabilitation efforts and possible transfer to [facility] for further assessment of her chronic left knee infection.     Larina Earthly, M.D.     RA/MEDQ  D:  10/12/2010  T:  10/12/2010  Job:  [id number]  cc:   Georga Hacking,  M.D. Lindaann Slough, M.D. Lubertha Basque Jerl Santos, M.D. Orthopedics, [facility] Dr. Almyra Free  Electronically Signed by Larina Earthly M.D. on 11/09/2010 08:46:39 PM

## 2010-11-09 NOTE — Discharge Summary (Signed)
  NAMELEONOR, Leah [name]                 ACCOUNT NO.:  [id number]  MEDICAL RECORD NO.:  [id number]  LOCATION:  4737                         FACILITY:  [facility]  PHYSICIAN:  Larina Earthly, M.D.        DATE OF BIRTH:  1927-03-03  DATE OF ADMISSION:  09/27/2010 DATE OF DISCHARGE:  10/13/2010                        DISCHARGE SUMMARY - REFERRING   ADDENDUM  The patient has remained stable overnight, voiding without hindrance, no further bowel movements overnight, C. difficile colitis resolved clinically.  The patient continues on oral antibiotics for the same. The patient scheduled for transfer to skilled nursing facility today, certainly the patient's history is significant for diastolic dysfunction and congestive heart failure in the setting of atrial fibrillation. Please note that the patient will need daily weights, close monitoring of her fluid status, administration of Lasix from 40-80 mg a day to maintain her weights and prevent volume overload.  This will also help prevent further hospitalizations and morbidity and mortality.  Other discharge medications are the same as dictated the previous night.     Larina Earthly, M.D.     RA/MEDQ  D:  10/13/2010  T:  10/13/2010  Job:  [id number]  Electronically Signed by Larina Earthly M.D. on 11/09/2010 08:46:43 PM

## 2011-01-19 DIAGNOSIS — G8918 Other acute postprocedural pain: Secondary | ICD-10-CM | POA: Diagnosis not present

## 2011-01-19 DIAGNOSIS — I4891 Unspecified atrial fibrillation: Secondary | ICD-10-CM | POA: Diagnosis not present

## 2011-01-19 DIAGNOSIS — I10 Essential (primary) hypertension: Secondary | ICD-10-CM | POA: Diagnosis not present

## 2011-01-19 DIAGNOSIS — Z4789 Encounter for other orthopedic aftercare: Secondary | ICD-10-CM | POA: Diagnosis not present

## 2011-01-19 DIAGNOSIS — M6281 Muscle weakness (generalized): Secondary | ICD-10-CM | POA: Diagnosis not present

## 2011-01-19 DIAGNOSIS — N390 Urinary tract infection, site not specified: Secondary | ICD-10-CM | POA: Diagnosis not present

## 2011-01-20 DIAGNOSIS — M6281 Muscle weakness (generalized): Secondary | ICD-10-CM | POA: Diagnosis not present

## 2011-01-20 DIAGNOSIS — Z4789 Encounter for other orthopedic aftercare: Secondary | ICD-10-CM | POA: Diagnosis not present

## 2011-01-20 DIAGNOSIS — I10 Essential (primary) hypertension: Secondary | ICD-10-CM | POA: Diagnosis not present

## 2011-01-20 DIAGNOSIS — I4891 Unspecified atrial fibrillation: Secondary | ICD-10-CM | POA: Diagnosis not present

## 2011-01-20 DIAGNOSIS — G8918 Other acute postprocedural pain: Secondary | ICD-10-CM | POA: Diagnosis not present

## 2011-01-20 DIAGNOSIS — N390 Urinary tract infection, site not specified: Secondary | ICD-10-CM | POA: Diagnosis not present

## 2011-01-23 DIAGNOSIS — N390 Urinary tract infection, site not specified: Secondary | ICD-10-CM | POA: Diagnosis not present

## 2011-01-23 DIAGNOSIS — M6281 Muscle weakness (generalized): Secondary | ICD-10-CM | POA: Diagnosis not present

## 2011-01-23 DIAGNOSIS — G8918 Other acute postprocedural pain: Secondary | ICD-10-CM | POA: Diagnosis not present

## 2011-01-23 DIAGNOSIS — I10 Essential (primary) hypertension: Secondary | ICD-10-CM | POA: Diagnosis not present

## 2011-01-23 DIAGNOSIS — Z4789 Encounter for other orthopedic aftercare: Secondary | ICD-10-CM | POA: Diagnosis not present

## 2011-01-23 DIAGNOSIS — I4891 Unspecified atrial fibrillation: Secondary | ICD-10-CM | POA: Diagnosis not present

## 2011-01-25 DIAGNOSIS — Z4789 Encounter for other orthopedic aftercare: Secondary | ICD-10-CM | POA: Diagnosis not present

## 2011-01-25 DIAGNOSIS — I10 Essential (primary) hypertension: Secondary | ICD-10-CM | POA: Diagnosis not present

## 2011-01-25 DIAGNOSIS — G8918 Other acute postprocedural pain: Secondary | ICD-10-CM | POA: Diagnosis not present

## 2011-01-25 DIAGNOSIS — N390 Urinary tract infection, site not specified: Secondary | ICD-10-CM | POA: Diagnosis not present

## 2011-01-25 DIAGNOSIS — M6281 Muscle weakness (generalized): Secondary | ICD-10-CM | POA: Diagnosis not present

## 2011-01-25 DIAGNOSIS — I4891 Unspecified atrial fibrillation: Secondary | ICD-10-CM | POA: Diagnosis not present

## 2011-01-26 DIAGNOSIS — I4891 Unspecified atrial fibrillation: Secondary | ICD-10-CM | POA: Diagnosis not present

## 2011-01-26 DIAGNOSIS — N390 Urinary tract infection, site not specified: Secondary | ICD-10-CM | POA: Diagnosis not present

## 2011-01-26 DIAGNOSIS — I10 Essential (primary) hypertension: Secondary | ICD-10-CM | POA: Diagnosis not present

## 2011-01-26 DIAGNOSIS — G8918 Other acute postprocedural pain: Secondary | ICD-10-CM | POA: Diagnosis not present

## 2011-01-26 DIAGNOSIS — M6281 Muscle weakness (generalized): Secondary | ICD-10-CM | POA: Diagnosis not present

## 2011-01-26 DIAGNOSIS — Z4789 Encounter for other orthopedic aftercare: Secondary | ICD-10-CM | POA: Diagnosis not present

## 2011-01-30 DIAGNOSIS — G8918 Other acute postprocedural pain: Secondary | ICD-10-CM | POA: Diagnosis not present

## 2011-01-30 DIAGNOSIS — I10 Essential (primary) hypertension: Secondary | ICD-10-CM | POA: Diagnosis not present

## 2011-01-30 DIAGNOSIS — M6281 Muscle weakness (generalized): Secondary | ICD-10-CM | POA: Diagnosis not present

## 2011-01-30 DIAGNOSIS — Z4789 Encounter for other orthopedic aftercare: Secondary | ICD-10-CM | POA: Diagnosis not present

## 2011-01-30 DIAGNOSIS — I4891 Unspecified atrial fibrillation: Secondary | ICD-10-CM | POA: Diagnosis not present

## 2011-01-30 DIAGNOSIS — N390 Urinary tract infection, site not specified: Secondary | ICD-10-CM | POA: Diagnosis not present

## 2011-01-31 DIAGNOSIS — I4891 Unspecified atrial fibrillation: Secondary | ICD-10-CM | POA: Diagnosis not present

## 2011-01-31 DIAGNOSIS — Z4789 Encounter for other orthopedic aftercare: Secondary | ICD-10-CM | POA: Diagnosis not present

## 2011-01-31 DIAGNOSIS — N390 Urinary tract infection, site not specified: Secondary | ICD-10-CM | POA: Diagnosis not present

## 2011-01-31 DIAGNOSIS — G8918 Other acute postprocedural pain: Secondary | ICD-10-CM | POA: Diagnosis not present

## 2011-01-31 DIAGNOSIS — I10 Essential (primary) hypertension: Secondary | ICD-10-CM | POA: Diagnosis not present

## 2011-01-31 DIAGNOSIS — M6281 Muscle weakness (generalized): Secondary | ICD-10-CM | POA: Diagnosis not present

## 2011-02-01 DIAGNOSIS — G8918 Other acute postprocedural pain: Secondary | ICD-10-CM | POA: Diagnosis not present

## 2011-02-01 DIAGNOSIS — Z4789 Encounter for other orthopedic aftercare: Secondary | ICD-10-CM | POA: Diagnosis not present

## 2011-02-01 DIAGNOSIS — M6281 Muscle weakness (generalized): Secondary | ICD-10-CM | POA: Diagnosis not present

## 2011-02-01 DIAGNOSIS — N390 Urinary tract infection, site not specified: Secondary | ICD-10-CM | POA: Diagnosis not present

## 2011-02-01 DIAGNOSIS — I10 Essential (primary) hypertension: Secondary | ICD-10-CM | POA: Diagnosis not present

## 2011-02-01 DIAGNOSIS — I4891 Unspecified atrial fibrillation: Secondary | ICD-10-CM | POA: Diagnosis not present

## 2011-02-03 DIAGNOSIS — G8918 Other acute postprocedural pain: Secondary | ICD-10-CM | POA: Diagnosis not present

## 2011-02-03 DIAGNOSIS — Z4789 Encounter for other orthopedic aftercare: Secondary | ICD-10-CM | POA: Diagnosis not present

## 2011-02-03 DIAGNOSIS — N390 Urinary tract infection, site not specified: Secondary | ICD-10-CM | POA: Diagnosis not present

## 2011-02-03 DIAGNOSIS — I4891 Unspecified atrial fibrillation: Secondary | ICD-10-CM | POA: Diagnosis not present

## 2011-02-03 DIAGNOSIS — M6281 Muscle weakness (generalized): Secondary | ICD-10-CM | POA: Diagnosis not present

## 2011-02-03 DIAGNOSIS — I10 Essential (primary) hypertension: Secondary | ICD-10-CM | POA: Diagnosis not present

## 2011-02-09 DIAGNOSIS — M6281 Muscle weakness (generalized): Secondary | ICD-10-CM | POA: Diagnosis not present

## 2011-02-09 DIAGNOSIS — N390 Urinary tract infection, site not specified: Secondary | ICD-10-CM | POA: Diagnosis not present

## 2011-02-09 DIAGNOSIS — I10 Essential (primary) hypertension: Secondary | ICD-10-CM | POA: Diagnosis not present

## 2011-02-09 DIAGNOSIS — G8918 Other acute postprocedural pain: Secondary | ICD-10-CM | POA: Diagnosis not present

## 2011-02-09 DIAGNOSIS — Z4789 Encounter for other orthopedic aftercare: Secondary | ICD-10-CM | POA: Diagnosis not present

## 2011-02-09 DIAGNOSIS — I4891 Unspecified atrial fibrillation: Secondary | ICD-10-CM | POA: Diagnosis not present

## 2011-02-13 DIAGNOSIS — Z4789 Encounter for other orthopedic aftercare: Secondary | ICD-10-CM | POA: Diagnosis not present

## 2011-02-13 DIAGNOSIS — G8918 Other acute postprocedural pain: Secondary | ICD-10-CM | POA: Diagnosis not present

## 2011-02-13 DIAGNOSIS — N390 Urinary tract infection, site not specified: Secondary | ICD-10-CM | POA: Diagnosis not present

## 2011-02-13 DIAGNOSIS — I4891 Unspecified atrial fibrillation: Secondary | ICD-10-CM | POA: Diagnosis not present

## 2011-02-13 DIAGNOSIS — I10 Essential (primary) hypertension: Secondary | ICD-10-CM | POA: Diagnosis not present

## 2011-02-13 DIAGNOSIS — M6281 Muscle weakness (generalized): Secondary | ICD-10-CM | POA: Diagnosis not present

## 2011-02-15 DIAGNOSIS — Z4789 Encounter for other orthopedic aftercare: Secondary | ICD-10-CM | POA: Diagnosis not present

## 2011-02-15 DIAGNOSIS — I4891 Unspecified atrial fibrillation: Secondary | ICD-10-CM | POA: Diagnosis not present

## 2011-02-15 DIAGNOSIS — I10 Essential (primary) hypertension: Secondary | ICD-10-CM | POA: Diagnosis not present

## 2011-02-15 DIAGNOSIS — N390 Urinary tract infection, site not specified: Secondary | ICD-10-CM | POA: Diagnosis not present

## 2011-02-15 DIAGNOSIS — G8918 Other acute postprocedural pain: Secondary | ICD-10-CM | POA: Diagnosis not present

## 2011-02-15 DIAGNOSIS — M6281 Muscle weakness (generalized): Secondary | ICD-10-CM | POA: Diagnosis not present

## 2011-02-21 DIAGNOSIS — I4891 Unspecified atrial fibrillation: Secondary | ICD-10-CM | POA: Diagnosis not present

## 2011-02-21 DIAGNOSIS — Z7901 Long term (current) use of anticoagulants: Secondary | ICD-10-CM | POA: Diagnosis not present

## 2011-03-01 DIAGNOSIS — Z7901 Long term (current) use of anticoagulants: Secondary | ICD-10-CM | POA: Diagnosis not present

## 2011-03-01 DIAGNOSIS — I4891 Unspecified atrial fibrillation: Secondary | ICD-10-CM | POA: Diagnosis not present

## 2011-03-01 DIAGNOSIS — N182 Chronic kidney disease, stage 2 (mild): Secondary | ICD-10-CM | POA: Diagnosis not present

## 2011-03-01 DIAGNOSIS — E785 Hyperlipidemia, unspecified: Secondary | ICD-10-CM | POA: Diagnosis not present

## 2011-03-23 DIAGNOSIS — Z7901 Long term (current) use of anticoagulants: Secondary | ICD-10-CM | POA: Diagnosis not present

## 2011-04-06 DIAGNOSIS — T8450XA Infection and inflammatory reaction due to unspecified internal joint prosthesis, initial encounter: Secondary | ICD-10-CM | POA: Diagnosis not present

## 2011-04-06 DIAGNOSIS — Z96659 Presence of unspecified artificial knee joint: Secondary | ICD-10-CM | POA: Diagnosis not present

## 2011-04-06 DIAGNOSIS — Z09 Encounter for follow-up examination after completed treatment for conditions other than malignant neoplasm: Secondary | ICD-10-CM | POA: Diagnosis not present

## 2011-04-06 DIAGNOSIS — Z981 Arthrodesis status: Secondary | ICD-10-CM | POA: Diagnosis not present

## 2011-04-21 DIAGNOSIS — I4891 Unspecified atrial fibrillation: Secondary | ICD-10-CM | POA: Diagnosis not present

## 2011-04-21 DIAGNOSIS — Z7901 Long term (current) use of anticoagulants: Secondary | ICD-10-CM | POA: Diagnosis not present

## 2011-04-21 DIAGNOSIS — R82998 Other abnormal findings in urine: Secondary | ICD-10-CM | POA: Diagnosis not present

## 2011-04-21 DIAGNOSIS — R350 Frequency of micturition: Secondary | ICD-10-CM | POA: Diagnosis not present

## 2011-04-21 DIAGNOSIS — N390 Urinary tract infection, site not specified: Secondary | ICD-10-CM | POA: Diagnosis not present

## 2011-04-26 DIAGNOSIS — I4891 Unspecified atrial fibrillation: Secondary | ICD-10-CM | POA: Diagnosis not present

## 2011-04-26 DIAGNOSIS — Z7901 Long term (current) use of anticoagulants: Secondary | ICD-10-CM | POA: Diagnosis not present

## 2011-05-15 DIAGNOSIS — I4891 Unspecified atrial fibrillation: Secondary | ICD-10-CM | POA: Diagnosis not present

## 2011-05-15 DIAGNOSIS — Z7901 Long term (current) use of anticoagulants: Secondary | ICD-10-CM | POA: Diagnosis not present

## 2011-06-01 DIAGNOSIS — M869 Osteomyelitis, unspecified: Secondary | ICD-10-CM | POA: Diagnosis not present

## 2011-06-14 DIAGNOSIS — N182 Chronic kidney disease, stage 2 (mild): Secondary | ICD-10-CM | POA: Diagnosis not present

## 2011-06-14 DIAGNOSIS — E039 Hypothyroidism, unspecified: Secondary | ICD-10-CM | POA: Diagnosis not present

## 2011-06-14 DIAGNOSIS — Z7901 Long term (current) use of anticoagulants: Secondary | ICD-10-CM | POA: Diagnosis not present

## 2011-06-14 DIAGNOSIS — J309 Allergic rhinitis, unspecified: Secondary | ICD-10-CM | POA: Diagnosis not present

## 2011-06-23 DIAGNOSIS — I059 Rheumatic mitral valve disease, unspecified: Secondary | ICD-10-CM | POA: Diagnosis not present

## 2011-06-23 DIAGNOSIS — E669 Obesity, unspecified: Secondary | ICD-10-CM | POA: Diagnosis not present

## 2011-06-23 DIAGNOSIS — R0609 Other forms of dyspnea: Secondary | ICD-10-CM | POA: Diagnosis not present

## 2011-06-23 DIAGNOSIS — I4891 Unspecified atrial fibrillation: Secondary | ICD-10-CM | POA: Diagnosis not present

## 2011-06-23 DIAGNOSIS — Z7901 Long term (current) use of anticoagulants: Secondary | ICD-10-CM | POA: Diagnosis not present

## 2011-06-23 DIAGNOSIS — E785 Hyperlipidemia, unspecified: Secondary | ICD-10-CM | POA: Diagnosis not present

## 2011-06-23 DIAGNOSIS — I119 Hypertensive heart disease without heart failure: Secondary | ICD-10-CM | POA: Diagnosis not present

## 2011-07-17 DIAGNOSIS — Z7901 Long term (current) use of anticoagulants: Secondary | ICD-10-CM | POA: Diagnosis not present

## 2011-07-17 DIAGNOSIS — I4891 Unspecified atrial fibrillation: Secondary | ICD-10-CM | POA: Diagnosis not present

## 2011-08-07 DIAGNOSIS — Z7901 Long term (current) use of anticoagulants: Secondary | ICD-10-CM | POA: Diagnosis not present

## 2011-08-07 DIAGNOSIS — I4891 Unspecified atrial fibrillation: Secondary | ICD-10-CM | POA: Diagnosis not present

## 2011-08-16 DIAGNOSIS — M19019 Primary osteoarthritis, unspecified shoulder: Secondary | ICD-10-CM | POA: Diagnosis not present

## 2011-08-16 DIAGNOSIS — M25569 Pain in unspecified knee: Secondary | ICD-10-CM | POA: Diagnosis not present

## 2011-09-07 DIAGNOSIS — Z7901 Long term (current) use of anticoagulants: Secondary | ICD-10-CM | POA: Diagnosis not present

## 2011-09-07 DIAGNOSIS — I4891 Unspecified atrial fibrillation: Secondary | ICD-10-CM | POA: Diagnosis not present

## 2011-09-20 DIAGNOSIS — Z7901 Long term (current) use of anticoagulants: Secondary | ICD-10-CM | POA: Diagnosis not present

## 2011-09-20 DIAGNOSIS — I4891 Unspecified atrial fibrillation: Secondary | ICD-10-CM | POA: Diagnosis not present

## 2011-10-17 DIAGNOSIS — Z7901 Long term (current) use of anticoagulants: Secondary | ICD-10-CM | POA: Diagnosis not present

## 2011-10-17 DIAGNOSIS — I10 Essential (primary) hypertension: Secondary | ICD-10-CM | POA: Diagnosis not present

## 2011-10-17 DIAGNOSIS — Z23 Encounter for immunization: Secondary | ICD-10-CM | POA: Diagnosis not present

## 2011-10-17 DIAGNOSIS — K5900 Constipation, unspecified: Secondary | ICD-10-CM | POA: Diagnosis not present

## 2011-10-30 DIAGNOSIS — Z7901 Long term (current) use of anticoagulants: Secondary | ICD-10-CM | POA: Diagnosis not present

## 2011-10-30 DIAGNOSIS — I4891 Unspecified atrial fibrillation: Secondary | ICD-10-CM | POA: Diagnosis not present

## 2011-11-21 DIAGNOSIS — Z7901 Long term (current) use of anticoagulants: Secondary | ICD-10-CM | POA: Diagnosis not present

## 2011-11-21 DIAGNOSIS — I4891 Unspecified atrial fibrillation: Secondary | ICD-10-CM | POA: Diagnosis not present

## 2011-11-23 DIAGNOSIS — I4891 Unspecified atrial fibrillation: Secondary | ICD-10-CM | POA: Diagnosis not present

## 2011-11-23 DIAGNOSIS — Z7901 Long term (current) use of anticoagulants: Secondary | ICD-10-CM | POA: Diagnosis not present

## 2011-11-27 DIAGNOSIS — I4891 Unspecified atrial fibrillation: Secondary | ICD-10-CM | POA: Diagnosis not present

## 2011-11-27 DIAGNOSIS — Z7901 Long term (current) use of anticoagulants: Secondary | ICD-10-CM | POA: Diagnosis not present

## 2011-12-01 DIAGNOSIS — Z981 Arthrodesis status: Secondary | ICD-10-CM | POA: Diagnosis not present

## 2011-12-01 DIAGNOSIS — T8450XA Infection and inflammatory reaction due to unspecified internal joint prosthesis, initial encounter: Secondary | ICD-10-CM | POA: Diagnosis not present

## 2011-12-01 DIAGNOSIS — Z96659 Presence of unspecified artificial knee joint: Secondary | ICD-10-CM | POA: Diagnosis not present

## 2011-12-01 DIAGNOSIS — Z471 Aftercare following joint replacement surgery: Secondary | ICD-10-CM | POA: Diagnosis not present

## 2011-12-04 DIAGNOSIS — Z7901 Long term (current) use of anticoagulants: Secondary | ICD-10-CM | POA: Diagnosis not present

## 2011-12-04 DIAGNOSIS — I4891 Unspecified atrial fibrillation: Secondary | ICD-10-CM | POA: Diagnosis not present

## 2011-12-11 DIAGNOSIS — M19019 Primary osteoarthritis, unspecified shoulder: Secondary | ICD-10-CM | POA: Diagnosis not present

## 2011-12-11 DIAGNOSIS — M25569 Pain in unspecified knee: Secondary | ICD-10-CM | POA: Diagnosis not present

## 2011-12-27 DIAGNOSIS — M1990 Unspecified osteoarthritis, unspecified site: Secondary | ICD-10-CM | POA: Diagnosis not present

## 2011-12-27 DIAGNOSIS — Z79899 Other long term (current) drug therapy: Secondary | ICD-10-CM | POA: Diagnosis not present

## 2011-12-27 DIAGNOSIS — M542 Cervicalgia: Secondary | ICD-10-CM | POA: Diagnosis not present

## 2011-12-27 DIAGNOSIS — M1710 Unilateral primary osteoarthritis, unspecified knee: Secondary | ICD-10-CM | POA: Diagnosis not present

## 2011-12-29 DIAGNOSIS — I4891 Unspecified atrial fibrillation: Secondary | ICD-10-CM | POA: Diagnosis not present

## 2011-12-29 DIAGNOSIS — E785 Hyperlipidemia, unspecified: Secondary | ICD-10-CM | POA: Diagnosis not present

## 2011-12-29 DIAGNOSIS — R0989 Other specified symptoms and signs involving the circulatory and respiratory systems: Secondary | ICD-10-CM | POA: Diagnosis not present

## 2011-12-29 DIAGNOSIS — Z7901 Long term (current) use of anticoagulants: Secondary | ICD-10-CM | POA: Diagnosis not present

## 2011-12-29 DIAGNOSIS — R0609 Other forms of dyspnea: Secondary | ICD-10-CM | POA: Diagnosis not present

## 2011-12-29 DIAGNOSIS — I119 Hypertensive heart disease without heart failure: Secondary | ICD-10-CM | POA: Diagnosis not present

## 2011-12-29 DIAGNOSIS — E669 Obesity, unspecified: Secondary | ICD-10-CM | POA: Diagnosis not present

## 2011-12-29 DIAGNOSIS — I059 Rheumatic mitral valve disease, unspecified: Secondary | ICD-10-CM | POA: Diagnosis not present

## 2012-01-02 ENCOUNTER — Other Ambulatory Visit: Payer: Self-pay | Admitting: Pain Medicine

## 2012-01-02 DIAGNOSIS — M545 Low back pain: Secondary | ICD-10-CM

## 2012-01-04 DIAGNOSIS — I4891 Unspecified atrial fibrillation: Secondary | ICD-10-CM | POA: Diagnosis not present

## 2012-01-04 DIAGNOSIS — Z7901 Long term (current) use of anticoagulants: Secondary | ICD-10-CM | POA: Diagnosis not present

## 2012-01-08 ENCOUNTER — Ambulatory Visit
Admission: RE | Admit: 2012-01-08 | Discharge: 2012-01-08 | Disposition: A | Discharge status: Home / Self Care | Payer: Medicare Other | Source: Ambulatory Visit | Attending: Pain Medicine | Admitting: Pain Medicine

## 2012-01-08 DIAGNOSIS — M545 Low back pain: Secondary | ICD-10-CM

## 2012-01-08 DIAGNOSIS — M5137 Other intervertebral disc degeneration, lumbosacral region: Secondary | ICD-10-CM | POA: Diagnosis not present

## 2012-01-08 DIAGNOSIS — M47817 Spondylosis without myelopathy or radiculopathy, lumbosacral region: Secondary | ICD-10-CM | POA: Diagnosis not present

## 2012-01-08 DIAGNOSIS — M4120 Other idiopathic scoliosis, site unspecified: Secondary | ICD-10-CM | POA: Diagnosis not present

## 2012-01-08 DIAGNOSIS — M48061 Spinal stenosis, lumbar region without neurogenic claudication: Secondary | ICD-10-CM | POA: Diagnosis not present

## 2012-01-17 DIAGNOSIS — M19019 Primary osteoarthritis, unspecified shoulder: Secondary | ICD-10-CM | POA: Diagnosis not present

## 2012-01-17 DIAGNOSIS — M5137 Other intervertebral disc degeneration, lumbosacral region: Secondary | ICD-10-CM | POA: Diagnosis not present

## 2012-01-17 DIAGNOSIS — M47817 Spondylosis without myelopathy or radiculopathy, lumbosacral region: Secondary | ICD-10-CM | POA: Diagnosis not present

## 2012-01-17 DIAGNOSIS — G894 Chronic pain syndrome: Secondary | ICD-10-CM | POA: Diagnosis not present

## 2012-01-22 DIAGNOSIS — M0500 Felty's syndrome, unspecified site: Secondary | ICD-10-CM | POA: Diagnosis not present

## 2012-01-22 DIAGNOSIS — M7750 Other enthesopathy of unspecified foot: Secondary | ICD-10-CM | POA: Diagnosis not present

## 2012-01-22 DIAGNOSIS — M2040 Other hammer toe(s) (acquired), unspecified foot: Secondary | ICD-10-CM | POA: Diagnosis not present

## 2012-02-09 DIAGNOSIS — I4891 Unspecified atrial fibrillation: Secondary | ICD-10-CM | POA: Diagnosis not present

## 2012-02-09 DIAGNOSIS — Z7901 Long term (current) use of anticoagulants: Secondary | ICD-10-CM | POA: Diagnosis not present

## 2012-02-23 DIAGNOSIS — N182 Chronic kidney disease, stage 2 (mild): Secondary | ICD-10-CM | POA: Diagnosis not present

## 2012-02-23 DIAGNOSIS — G894 Chronic pain syndrome: Secondary | ICD-10-CM | POA: Diagnosis not present

## 2012-02-23 DIAGNOSIS — E039 Hypothyroidism, unspecified: Secondary | ICD-10-CM | POA: Diagnosis not present

## 2012-02-23 DIAGNOSIS — Z7901 Long term (current) use of anticoagulants: Secondary | ICD-10-CM | POA: Diagnosis not present

## 2012-03-05 DIAGNOSIS — G894 Chronic pain syndrome: Secondary | ICD-10-CM | POA: Diagnosis not present

## 2012-03-05 DIAGNOSIS — M5137 Other intervertebral disc degeneration, lumbosacral region: Secondary | ICD-10-CM | POA: Diagnosis not present

## 2012-03-05 DIAGNOSIS — M1990 Unspecified osteoarthritis, unspecified site: Secondary | ICD-10-CM | POA: Diagnosis not present

## 2012-03-05 DIAGNOSIS — M5380 Other specified dorsopathies, site unspecified: Secondary | ICD-10-CM | POA: Diagnosis not present

## 2012-04-02 DIAGNOSIS — M5137 Other intervertebral disc degeneration, lumbosacral region: Secondary | ICD-10-CM | POA: Diagnosis not present

## 2012-04-02 DIAGNOSIS — M47817 Spondylosis without myelopathy or radiculopathy, lumbosacral region: Secondary | ICD-10-CM | POA: Diagnosis not present

## 2012-04-02 DIAGNOSIS — Z7901 Long term (current) use of anticoagulants: Secondary | ICD-10-CM | POA: Diagnosis not present

## 2012-04-02 DIAGNOSIS — I4891 Unspecified atrial fibrillation: Secondary | ICD-10-CM | POA: Diagnosis not present

## 2012-04-02 DIAGNOSIS — Z79899 Other long term (current) drug therapy: Secondary | ICD-10-CM | POA: Diagnosis not present

## 2012-04-02 DIAGNOSIS — G894 Chronic pain syndrome: Secondary | ICD-10-CM | POA: Diagnosis not present

## 2012-04-09 ENCOUNTER — Other Ambulatory Visit: Payer: Self-pay | Admitting: Pain Medicine

## 2012-04-09 DIAGNOSIS — M25511 Pain in right shoulder: Secondary | ICD-10-CM

## 2012-04-12 DIAGNOSIS — I4891 Unspecified atrial fibrillation: Secondary | ICD-10-CM | POA: Diagnosis not present

## 2012-04-12 DIAGNOSIS — Z7901 Long term (current) use of anticoagulants: Secondary | ICD-10-CM | POA: Diagnosis not present

## 2012-04-17 ENCOUNTER — Ambulatory Visit
Admission: RE | Admit: 2012-04-17 | Discharge: 2012-04-17 | Disposition: A | Discharge status: Home / Self Care | Payer: Medicare Other | Source: Ambulatory Visit | Attending: Pain Medicine | Admitting: Pain Medicine

## 2012-04-17 DIAGNOSIS — M19019 Primary osteoarthritis, unspecified shoulder: Secondary | ICD-10-CM | POA: Diagnosis not present

## 2012-04-17 DIAGNOSIS — M25419 Effusion, unspecified shoulder: Secondary | ICD-10-CM | POA: Diagnosis not present

## 2012-04-17 DIAGNOSIS — M25511 Pain in right shoulder: Secondary | ICD-10-CM

## 2012-04-23 DIAGNOSIS — IMO0001 Reserved for inherently not codable concepts without codable children: Secondary | ICD-10-CM | POA: Diagnosis not present

## 2012-04-23 DIAGNOSIS — Z79899 Other long term (current) drug therapy: Secondary | ICD-10-CM | POA: Diagnosis not present

## 2012-04-23 DIAGNOSIS — M19019 Primary osteoarthritis, unspecified shoulder: Secondary | ICD-10-CM | POA: Diagnosis not present

## 2012-04-23 DIAGNOSIS — G894 Chronic pain syndrome: Secondary | ICD-10-CM | POA: Diagnosis not present

## 2012-04-26 DIAGNOSIS — I4891 Unspecified atrial fibrillation: Secondary | ICD-10-CM | POA: Diagnosis not present

## 2012-04-26 DIAGNOSIS — Z7901 Long term (current) use of anticoagulants: Secondary | ICD-10-CM | POA: Diagnosis not present

## 2012-05-13 IMAGING — CR DG CHEST 1V PORT
1 series · 1 of 1 positions shown · non-contrast
Comparison: 09/08/2010; 09/04/2010; 09/03/2010; 04/11/2010

CLINICAL DATA: Pneumonia.

PORTABLE CHEST - 1 VIEW

[view not recorded]
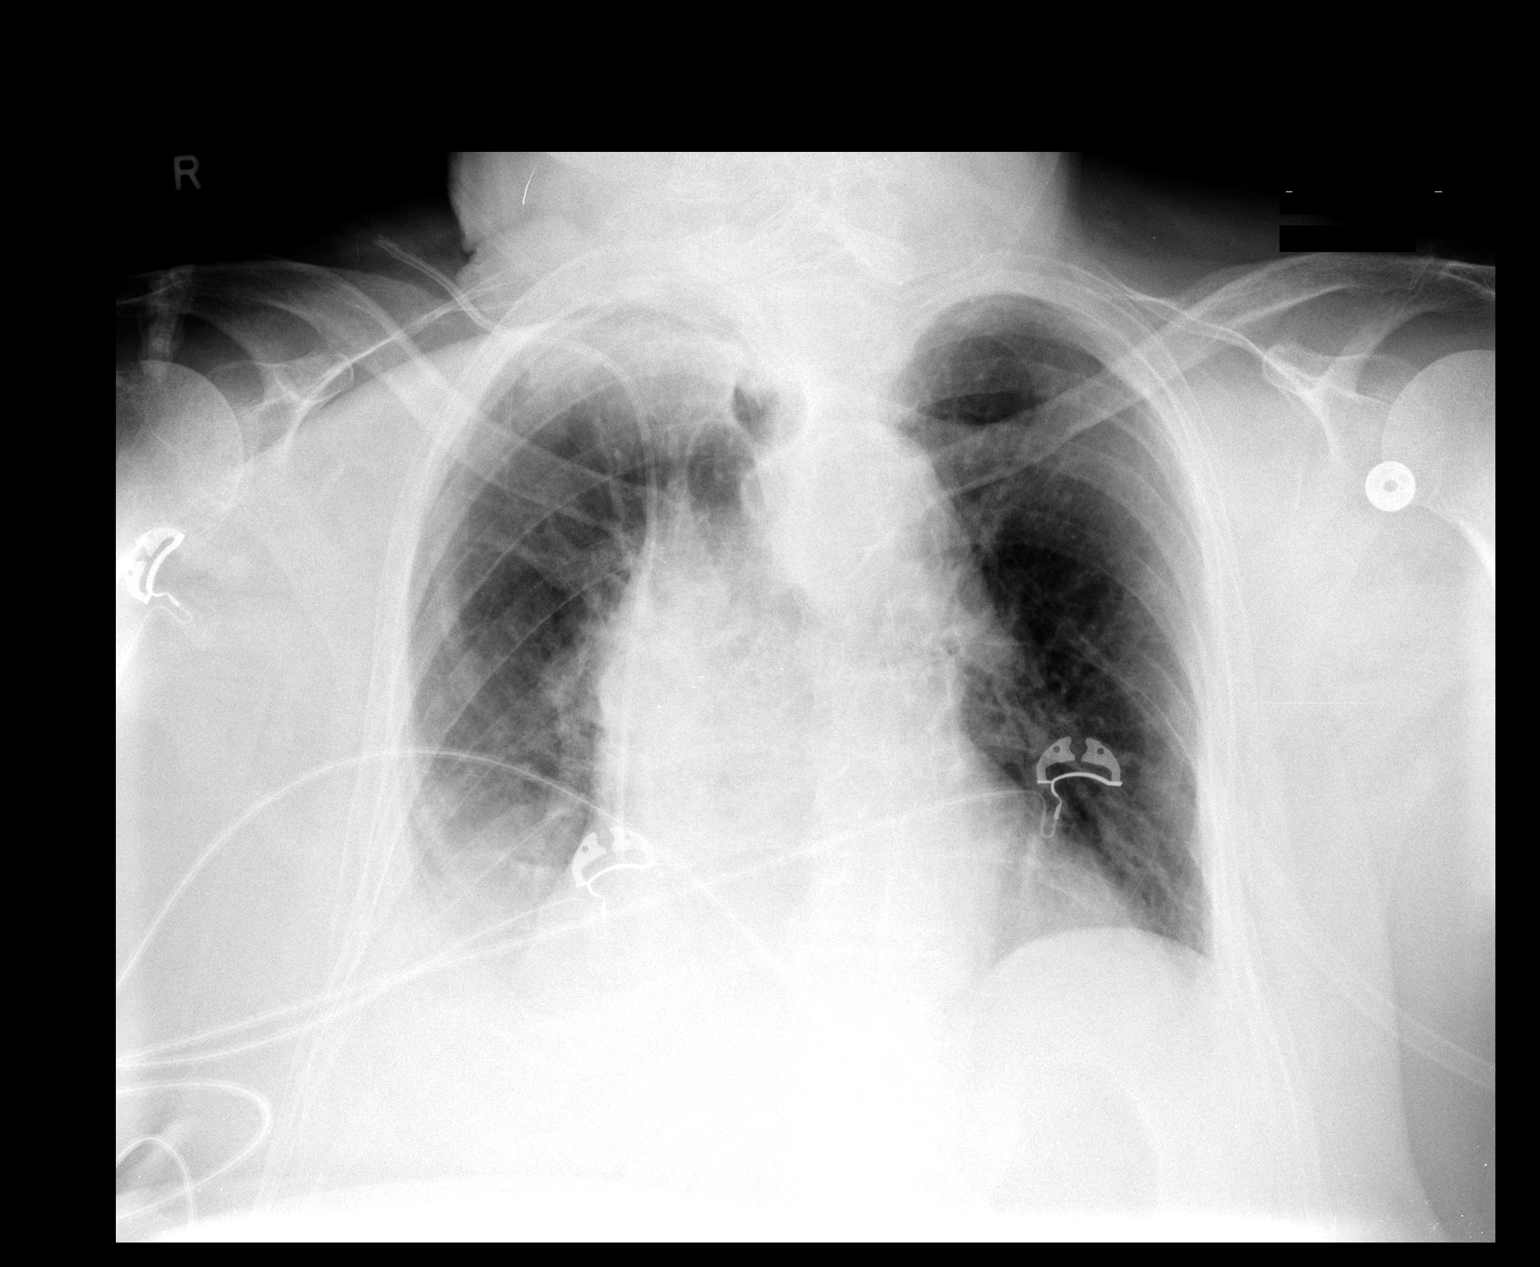

[1 of 1 positions shown; findings below may reference images not displayed]

FINDINGS: Grossly unchanged cardiac silhouette and mediastinal
contours  with unchanged rightward deviation of the tracheal air
column.  Stable support apparatus.  Minimally improved aeration of
the right lung with persistent small right-sided effusion and right
perihilar and bibasilar heterogeneous opacities.  No new focal
airspace opacities. No pneumothorax.  Grossly unchanged bones.
IMPRESSION: Minimal improved aeration of the right lung with persistent right
perihilar opacity and right-sided pleural effusion.  A follow-up
chest radiograph in 4 to 6 weeks after treatment is recommended to
ensure complete resolution.

## 2012-05-14 IMAGING — CR DG CHEST 1V PORT
1 series · 1 of 1 positions shown · non-contrast
Comparison: Chest x-ray 09/09/2010.

CLINICAL DATA: Pneumonia.

PORTABLE CHEST - 1 VIEW

[view not recorded]
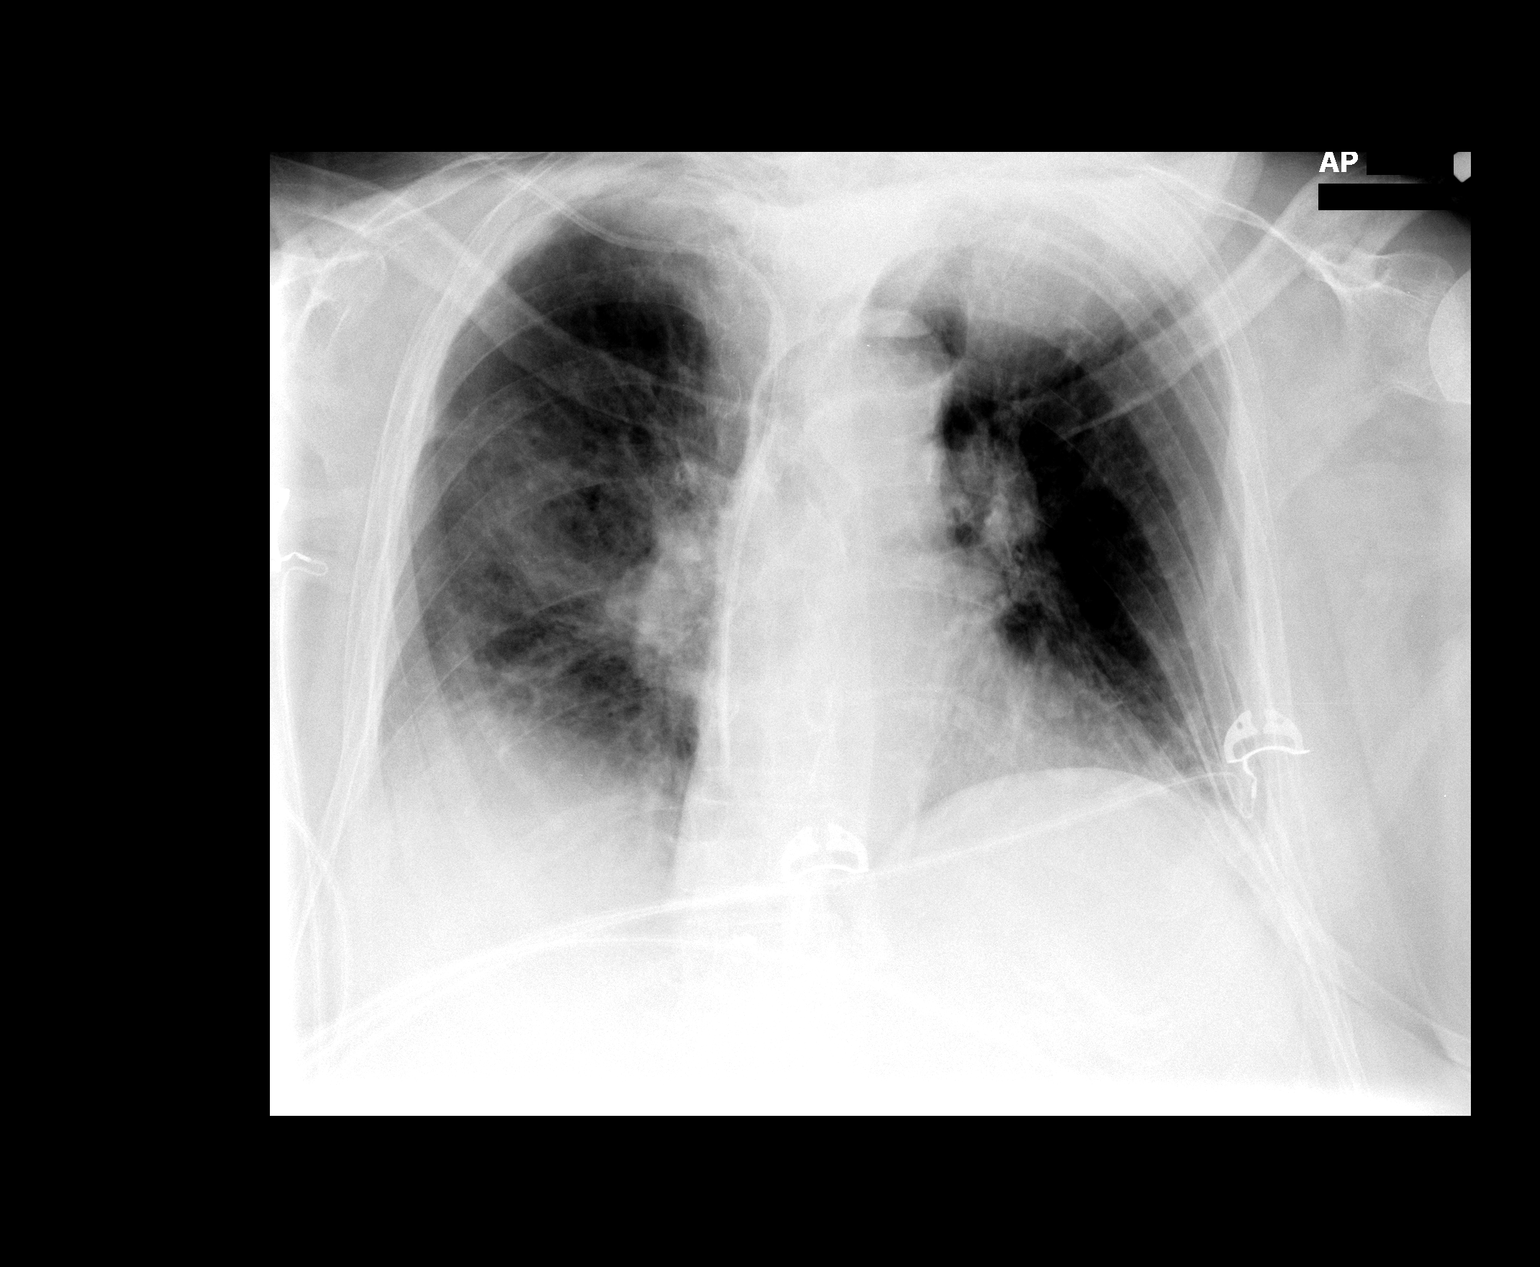

[1 of 1 positions shown; findings below may reference images not displayed]

FINDINGS: The cardiac silhouette, mediastinal and hilar contours
are stable.  There is a new right midlung airspace opacity, likely
infiltrate/pneumonia.  The left lung is relatively clear.
IMPRESSION: New right midlung airspace opacity, likely pneumonia.

## 2012-05-15 DIAGNOSIS — M4120 Other idiopathic scoliosis, site unspecified: Secondary | ICD-10-CM | POA: Diagnosis not present

## 2012-05-15 DIAGNOSIS — M1710 Unilateral primary osteoarthritis, unspecified knee: Secondary | ICD-10-CM | POA: Diagnosis not present

## 2012-05-15 DIAGNOSIS — G894 Chronic pain syndrome: Secondary | ICD-10-CM | POA: Diagnosis not present

## 2012-05-15 DIAGNOSIS — M5137 Other intervertebral disc degeneration, lumbosacral region: Secondary | ICD-10-CM | POA: Diagnosis not present

## 2012-05-16 IMAGING — CR DG CHEST 1V PORT
1 series · 1 of 1 positions shown · non-contrast
Comparison: 09/10/2010

CLINICAL DATA: Follow up of pneumonia.  CHF.  Atrial fibrillation.

PORTABLE CHEST - 1 VIEW

[view not recorded]
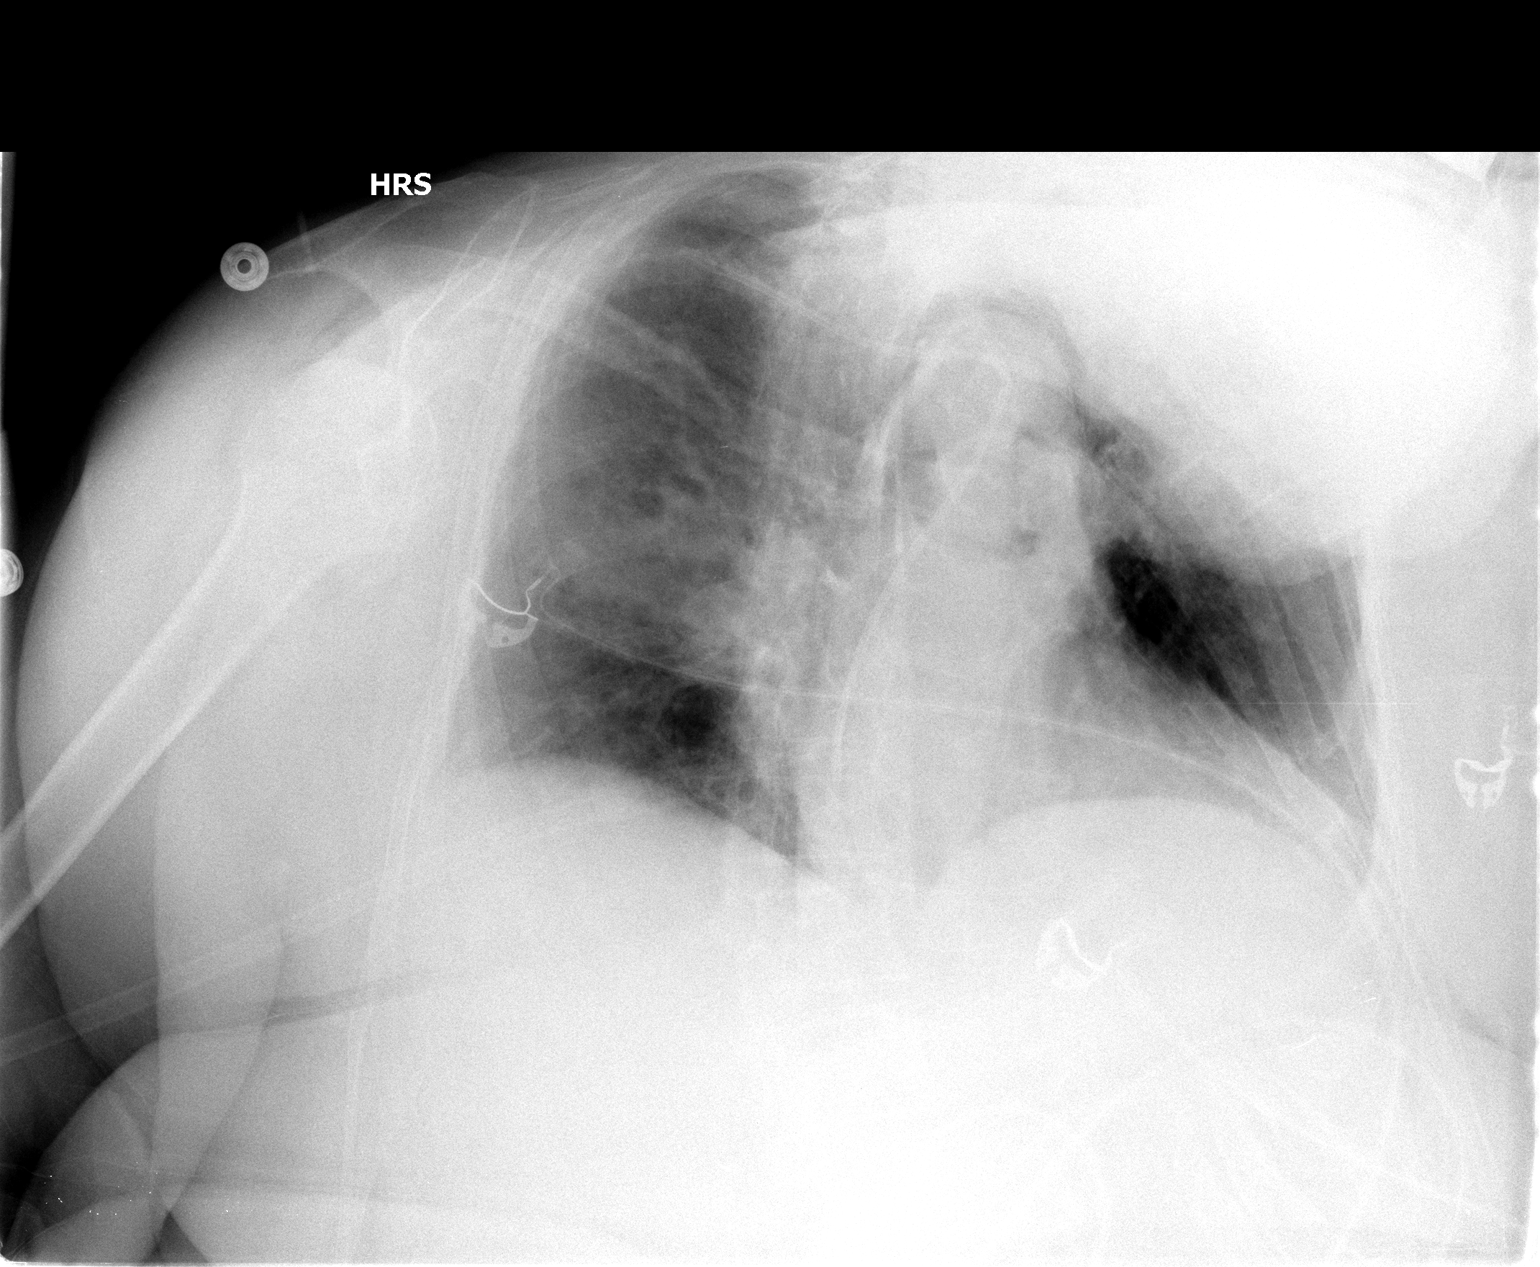

[1 of 1 positions shown; findings below may reference images not displayed]

FINDINGS: Moderate to markedly degraded exam secondary to patient
positioning.  Extensive artifact over the right chest with the chin
overlying the superior aspect of the left chest.

A right-sided central line is likely similar in position. Normal
heart size.  No definite pleural fluid.  No right pneumothorax.
Similar right midlung airspace disease.  Left lung grossly clear.
IMPRESSION: No significant change in right midlung airspace disease, likely
infection.

Moderate to severely degraded exam secondary patient positioning.

## 2012-05-28 DIAGNOSIS — I4891 Unspecified atrial fibrillation: Secondary | ICD-10-CM | POA: Diagnosis not present

## 2012-05-28 DIAGNOSIS — Z7901 Long term (current) use of anticoagulants: Secondary | ICD-10-CM | POA: Diagnosis not present

## 2012-05-29 DIAGNOSIS — M19019 Primary osteoarthritis, unspecified shoulder: Secondary | ICD-10-CM | POA: Diagnosis not present

## 2012-06-12 DIAGNOSIS — M5137 Other intervertebral disc degeneration, lumbosacral region: Secondary | ICD-10-CM | POA: Diagnosis not present

## 2012-06-12 DIAGNOSIS — G894 Chronic pain syndrome: Secondary | ICD-10-CM | POA: Diagnosis not present

## 2012-06-12 DIAGNOSIS — M1710 Unilateral primary osteoarthritis, unspecified knee: Secondary | ICD-10-CM | POA: Diagnosis not present

## 2012-06-12 DIAGNOSIS — I4891 Unspecified atrial fibrillation: Secondary | ICD-10-CM | POA: Diagnosis not present

## 2012-06-12 DIAGNOSIS — Z7901 Long term (current) use of anticoagulants: Secondary | ICD-10-CM | POA: Diagnosis not present

## 2012-06-26 DIAGNOSIS — I4891 Unspecified atrial fibrillation: Secondary | ICD-10-CM | POA: Diagnosis not present

## 2012-06-26 DIAGNOSIS — E785 Hyperlipidemia, unspecified: Secondary | ICD-10-CM | POA: Diagnosis not present

## 2012-06-26 DIAGNOSIS — N182 Chronic kidney disease, stage 2 (mild): Secondary | ICD-10-CM | POA: Diagnosis not present

## 2012-06-26 DIAGNOSIS — G894 Chronic pain syndrome: Secondary | ICD-10-CM | POA: Diagnosis not present

## 2012-06-26 DIAGNOSIS — E039 Hypothyroidism, unspecified: Secondary | ICD-10-CM | POA: Diagnosis not present

## 2012-06-26 DIAGNOSIS — I2510 Atherosclerotic heart disease of native coronary artery without angina pectoris: Secondary | ICD-10-CM | POA: Diagnosis not present

## 2012-06-26 DIAGNOSIS — K219 Gastro-esophageal reflux disease without esophagitis: Secondary | ICD-10-CM | POA: Diagnosis not present

## 2012-06-26 DIAGNOSIS — I10 Essential (primary) hypertension: Secondary | ICD-10-CM | POA: Diagnosis not present

## 2012-06-28 DIAGNOSIS — E669 Obesity, unspecified: Secondary | ICD-10-CM | POA: Diagnosis not present

## 2012-06-28 DIAGNOSIS — Z7901 Long term (current) use of anticoagulants: Secondary | ICD-10-CM | POA: Diagnosis not present

## 2012-06-28 DIAGNOSIS — I119 Hypertensive heart disease without heart failure: Secondary | ICD-10-CM | POA: Diagnosis not present

## 2012-06-28 DIAGNOSIS — E785 Hyperlipidemia, unspecified: Secondary | ICD-10-CM | POA: Diagnosis not present

## 2012-06-28 DIAGNOSIS — R0609 Other forms of dyspnea: Secondary | ICD-10-CM | POA: Diagnosis not present

## 2012-06-28 DIAGNOSIS — I059 Rheumatic mitral valve disease, unspecified: Secondary | ICD-10-CM | POA: Diagnosis not present

## 2012-06-28 DIAGNOSIS — I4891 Unspecified atrial fibrillation: Secondary | ICD-10-CM | POA: Diagnosis not present

## 2012-07-10 DIAGNOSIS — M5137 Other intervertebral disc degeneration, lumbosacral region: Secondary | ICD-10-CM | POA: Diagnosis not present

## 2012-07-10 DIAGNOSIS — G894 Chronic pain syndrome: Secondary | ICD-10-CM | POA: Diagnosis not present

## 2012-07-10 DIAGNOSIS — M47817 Spondylosis without myelopathy or radiculopathy, lumbosacral region: Secondary | ICD-10-CM | POA: Diagnosis not present

## 2012-07-10 DIAGNOSIS — Z79899 Other long term (current) drug therapy: Secondary | ICD-10-CM | POA: Diagnosis not present

## 2012-07-14 ENCOUNTER — Emergency Department (HOSPITAL_COMMUNITY): Payer: Medicare Other

## 2012-07-14 ENCOUNTER — Emergency Department (HOSPITAL_COMMUNITY)
Admission: EM | Admit: 2012-07-14 | Discharge: 2012-07-14 | Disposition: A | Discharge status: Home / Self Care | Payer: Medicare Other | Attending: Emergency Medicine | Admitting: Emergency Medicine

## 2012-07-14 ENCOUNTER — Encounter (HOSPITAL_COMMUNITY): Payer: Self-pay | Admitting: Emergency Medicine

## 2012-07-14 DIAGNOSIS — S0180XA Unspecified open wound of other part of head, initial encounter: Secondary | ICD-10-CM | POA: Insufficient documentation

## 2012-07-14 DIAGNOSIS — S0993XA Unspecified injury of face, initial encounter: Secondary | ICD-10-CM | POA: Diagnosis not present

## 2012-07-14 DIAGNOSIS — I2510 Atherosclerotic heart disease of native coronary artery without angina pectoris: Secondary | ICD-10-CM | POA: Insufficient documentation

## 2012-07-14 DIAGNOSIS — I10 Essential (primary) hypertension: Secondary | ICD-10-CM | POA: Insufficient documentation

## 2012-07-14 DIAGNOSIS — S0003XA Contusion of scalp, initial encounter: Secondary | ICD-10-CM | POA: Diagnosis not present

## 2012-07-14 DIAGNOSIS — W19XXXA Unspecified fall, initial encounter: Secondary | ICD-10-CM

## 2012-07-14 DIAGNOSIS — Y939 Activity, unspecified: Secondary | ICD-10-CM | POA: Insufficient documentation

## 2012-07-14 DIAGNOSIS — Z8659 Personal history of other mental and behavioral disorders: Secondary | ICD-10-CM | POA: Diagnosis not present

## 2012-07-14 DIAGNOSIS — Z23 Encounter for immunization: Secondary | ICD-10-CM | POA: Insufficient documentation

## 2012-07-14 DIAGNOSIS — W06XXXA Fall from bed, initial encounter: Secondary | ICD-10-CM | POA: Insufficient documentation

## 2012-07-14 DIAGNOSIS — S0191XA Laceration without foreign body of unspecified part of head, initial encounter: Secondary | ICD-10-CM

## 2012-07-14 DIAGNOSIS — S0190XA Unspecified open wound of unspecified part of head, initial encounter: Secondary | ICD-10-CM | POA: Diagnosis not present

## 2012-07-14 DIAGNOSIS — Z862 Personal history of diseases of the blood and blood-forming organs and certain disorders involving the immune mechanism: Secondary | ICD-10-CM | POA: Diagnosis not present

## 2012-07-14 DIAGNOSIS — Z8639 Personal history of other endocrine, nutritional and metabolic disease: Secondary | ICD-10-CM | POA: Insufficient documentation

## 2012-07-14 DIAGNOSIS — S199XXA Unspecified injury of neck, initial encounter: Secondary | ICD-10-CM | POA: Diagnosis not present

## 2012-07-14 DIAGNOSIS — Y92009 Unspecified place in unspecified non-institutional (private) residence as the place of occurrence of the external cause: Secondary | ICD-10-CM | POA: Insufficient documentation

## 2012-07-14 DIAGNOSIS — S1093XA Contusion of unspecified part of neck, initial encounter: Secondary | ICD-10-CM | POA: Insufficient documentation

## 2012-07-14 HISTORY — DX: Disorder of thyroid, unspecified: E07.9

## 2012-07-14 HISTORY — DX: Atherosclerotic heart disease of native coronary artery without angina pectoris: I25.10

## 2012-07-14 HISTORY — DX: Panic disorder (episodic paroxysmal anxiety): F41.0

## 2012-07-14 HISTORY — DX: Essential (primary) hypertension: I10

## 2012-07-14 MED ORDER — TETANUS-DIPHTH-ACELL PERTUSSIS 5-2.5-18.5 LF-MCG/0.5 IM SUSP
0.5000 mL | Freq: Once | INTRAMUSCULAR | Status: AC
  Administered 2012-07-14: 0.5 mL via INTRAMUSCULAR
  Filled 2012-07-14: qty 0.5

## 2012-07-14 NOTE — ED Notes (Signed)
Pt. Stated, i laid across the bed and it was sleeky and i slid right off of ot.  Laceration to let eye 2". No LOC. Bleeding controlled.

## 2012-07-14 NOTE — ED Provider Notes (Signed)
Medical screening examination/treatment/procedure(s) were conducted as a shared visit with non-physician practitioner(s) and myself.  I personally evaluated the patient during the encounter  Well-appearing.  Laceration repaired with Dermabond.  Head injury and infection warnings given.  She understands to return to the ER for new or worsening symptoms.  Lyanne Co, MD 07/14/12 1114

## 2012-07-14 NOTE — ED Provider Notes (Signed)
History     CSN: [id number]  Arrival date & time 07/14/12  4098   First MD Initiated Contact with Patient 07/14/12 0830      Chief Complaint  Patient presents with  . Head Laceration    (Consider location/radiation/quality/duration/timing/severity/associated sxs/prior treatment) HPI Comments: Patient presents emergency department with chief complaint of laceration. She states that she slid off of her bed earlier this morning, and hit her left eyebrow on her nightstand. She denies loss of consciousness. She endorses mild associated neck pain. She states that her pain in general is mild. Bleeding is controlled. Last tetanus shot is unknown. She states that she does take blood thinners. She denies numbness or tingling or weakness of the extremities. Denies fevers, chills, nausea, vomiting, diarrhea, constipation.  The history is provided by the patient. No language interpreter was used.    Past Medical History  Diagnosis Date  . Hypertension   . Thyroid disease   . Coronary artery disease   . Panic attacks     Past Surgical History  Procedure Laterality Date  . Joint replacement      No family history on file.  History  Substance Use Topics  . Smoking status: Not on file  . Smokeless tobacco: Not on file  . Alcohol Use: No    OB History   Grav Para Term Preterm Abortions TAB SAB Ect Mult Living                  Review of Systems  All other systems reviewed and are negative.    Allergies  Codeine  Home Medications  No current outpatient prescriptions on file.  BP 153/78  Pulse 62  Temp(Src) 98.3 F (36.8 C) (Oral)  Resp 14  SpO2 96%  Physical Exam  Nursing note and vitals reviewed. Constitutional: She is oriented to person, place, and time. She appears well-developed and well-nourished.  HENT:  Head: Normocephalic and atraumatic.  Contusion to left eyebrow and face, with associated shallow laceration, no palpable bony abnormality  Eyes: Conjunctivae  and EOM are normal. Pupils are equal, round, and reactive to light.  Neck: Normal range of motion. Neck supple.  Cardiovascular: Normal rate and regular rhythm.  Exam reveals no gallop and no friction rub.   No murmur heard. Pulmonary/Chest: Effort normal and breath sounds normal. No respiratory distress. She has no wheezes. She has no rales. She exhibits no tenderness.  Abdominal: Soft. Bowel sounds are normal. She exhibits no distension and no mass. There is no tenderness. There is no rebound and no guarding.  Musculoskeletal: Normal range of motion. She exhibits no edema and no tenderness.  Moves all extremities  Neurological: She is alert and oriented to person, place, and time.  CN 3-12 intact, Sensation and strength intact bilaterally  Skin: Skin is warm and dry.  3 cm shallow laceration to left upper eyebrow  Psychiatric: She has a normal mood and affect. Her behavior is normal. Judgment and thought content normal.    ED Course  Procedures (including critical care time)  Results for orders placed during the hospital encounter of 09/27/10  MRSA PCR SCREENING      Result Value Range   MRSA by PCR NEGATIVE  NEGATIVE  CULTURE, BLOOD (ROUTINE X 2)      Result Value Range   Specimen Description BLOOD ARM LEFT     Special Requests       Value: BOTTLES DRAWN AEROBIC AND ANAEROBIC 10CC PT ON VANCOMYCIN,CIPRO   Culture  Setup Time [id number]     Culture NO GROWTH 5 DAYS     Report Status 10/03/2010 FINAL    CULTURE, BLOOD (ROUTINE X 2)      Result Value Range   Specimen Description BLOOD HAND LEFT     Special Requests       Value: BOTTLES DRAWN AEROBIC ONLY 5CC PT ON VANCOMYCIN,CIPRO   Culture  Setup Time [id number]     Culture NO GROWTH 5 DAYS     Report Status 10/03/2010 FINAL    CLOSTRIDIUM DIFFICILE BY PCR      Result Value Range   C difficile by pcr POSITIVE (*) NEGATIVE  CLOSTRIDIUM DIFFICILE BY PCR      Result Value Range   C difficile by pcr POSITIVE (*) NEGATIVE   URINE CULTURE      Result Value Range   Specimen Description URINE, CATHETERIZED     Special Requests       Value: vancomycin, Flagyl IMMUNE:NORM UT SYMPT:POS DRAWN BY RN   Culture  Setup Time [id number]     Colony Count 50,000 COLONIES/ML     Culture YEAST     Report Status 10/06/2010 FINAL    DIFFERENTIAL      Result Value Range   Neutrophils Relative % 76  43 - 77 %   Neutro Abs 10.6 (*) 1.7 - 7.7 K/uL   Lymphocytes Relative 18  12 - 46 %   Lymphs Abs 2.5  0.7 - 4.0 K/uL   Monocytes Relative 5  3 - 12 %   Monocytes Absolute 0.7  0.1 - 1.0 K/uL   Eosinophils Relative 1  0 - 5 %   Eosinophils Absolute 0.1  0.0 - 0.7 K/uL   Basophils Relative 0  0 - 1 %   Basophils Absolute 0.1  0.0 - 0.1 K/uL  CBC      Result Value Range   WBC 14.0 (*) 4.0 - 10.5 K/uL   RBC 3.67 (*) 3.87 - 5.11 MIL/uL   Hemoglobin 10.4 (*) 12.0 - 15.0 g/dL   HCT 24.4 (*) 01.0 - 27.2 %   MCV 88.6  78.0 - 100.0 fL   MCH 28.3  26.0 - 34.0 pg   MCHC 32.0  30.0 - 36.0 g/dL   RDW 53.6 (*) 64.4 - 03.4 %   Platelets 364  150 - 400 K/uL  BASIC METABOLIC PANEL      Result Value Range   Sodium 140  135 - 145 mEq/L   Potassium 3.5  3.5 - 5.1 mEq/L   Chloride 102  96 - 112 mEq/L   CO2 28  19 - 32 mEq/L   Glucose, Bld 230 (*) 70 - 99 mg/dL   BUN 10  6 - 23 mg/dL   Creatinine, Ser 7.42  0.50 - 1.10 mg/dL   Calcium 59.5  8.4 - 63.8 mg/dL   GFR calc non Af Amer 59 (*) >60 mL/min   GFR calc Af Amer >60  >60 mL/min  PROTIME-INR      Result Value Range   Prothrombin Time 33.2 (*) 11.6 - 15.2 seconds   INR 3.19 (*) 0.00 - 1.49  CK TOTAL AND CKMB      Result Value Range   Total CK 17  7 - 177 U/L   CK, MB 2.2  0.3 - 4.0 ng/mL   Relative Index RELATIVE INDEX IS INVALID  0.0 - 2.5  TROPONIN I      Result Value Range   Troponin  I <0.30  <0.30 ng/mL  PRO B NATRIURETIC PEPTIDE      Result Value Range   Pro B Natriuretic peptide (BNP) 3354.0 (*) 0 - 450 pg/mL  CARDIAC PANEL(CRET KIN+CKTOT+MB+TROPI)      Result  Value Range   Total CK 20  7 - 177 U/L   CK, MB 2.2  0.3 - 4.0 ng/mL   Troponin I <0.30  <0.30 ng/mL   Relative Index RELATIVE INDEX IS INVALID  0.0 - 2.5  CBC      Result Value Range   WBC 8.0  4.0 - 10.5 K/uL   RBC 3.51 (*) 3.87 - 5.11 MIL/uL   Hemoglobin 9.9 (*) 12.0 - 15.0 g/dL   HCT 41.3 (*) 24.4 - 01.0 %   MCV 86.6  78.0 - 100.0 fL   MCH 28.2  26.0 - 34.0 pg   MCHC 32.6  30.0 - 36.0 g/dL   RDW 27.2  53.6 - 64.4 %   Platelets 365  150 - 400 K/uL  CARDIAC PANEL(CRET KIN+CKTOT+MB+TROPI)      Result Value Range   Total CK 17  7 - 177 U/L   CK, MB 2.4  0.3 - 4.0 ng/mL   Troponin I <0.30  <0.30 ng/mL   Relative Index RELATIVE INDEX IS INVALID  0.0 - 2.5  PRO B NATRIURETIC PEPTIDE      Result Value Range   Pro B Natriuretic peptide (BNP) 3550.0 (*) 0 - 450 pg/mL  COMPREHENSIVE METABOLIC PANEL      Result Value Range   Sodium 137  135 - 145 mEq/L   Potassium 3.0 (*) 3.5 - 5.1 mEq/L   Chloride 98  96 - 112 mEq/L   CO2 23  19 - 32 mEq/L   Glucose, Bld 272 (*) 70 - 99 mg/dL   BUN 18  6 - 23 mg/dL   Creatinine, Ser 0.34  0.50 - 1.10 mg/dL   Calcium 74.2  8.4 - 59.5 mg/dL   Total Protein 6.6  6.0 - 8.3 g/dL   Albumin 2.6 (*) 3.5 - 5.2 g/dL   AST 13  0 - 37 U/L   ALT 10  0 - 35 U/L   Alkaline Phosphatase 99  39 - 117 U/L   Total Bilirubin 0.3  0.3 - 1.2 mg/dL   GFR calc non Af Amer 50 (*) >60 mL/min   GFR calc Af Amer >60  >60 mL/min  PROTIME-INR      Result Value Range   Prothrombin Time 35.1 (*) 11.6 - 15.2 seconds   INR 3.43 (*) 0.00 - 1.49  CBC      Result Value Range   WBC 15.3 (*) 4.0 - 10.5 K/uL   RBC 3.18 (*) 3.87 - 5.11 MIL/uL   Hemoglobin 8.9 (*) 12.0 - 15.0 g/dL   HCT 63.8 (*) 75.6 - 43.3 %   MCV 86.5  78.0 - 100.0 fL   MCH 28.0  26.0 - 34.0 pg   MCHC 32.4  30.0 - 36.0 g/dL   RDW 29.5 (*) 18.8 - 41.6 %   Platelets 271  150 - 400 K/uL  PROTIME-INR      Result Value Range   Prothrombin Time 44.4 (*) 11.6 - 15.2 seconds   INR 4.63 (*) 0.00 - 1.49  BASIC  METABOLIC PANEL      Result Value Range   Sodium 139  135 - 145 mEq/L   Potassium 3.9  3.5 - 5.1 mEq/L   Chloride 103  96 - 112 mEq/L   CO2 28  19 - 32 mEq/L   Glucose, Bld 140 (*) 70 - 99 mg/dL   BUN 16  6 - 23 mg/dL   Creatinine, Ser 1.61  0.50 - 1.10 mg/dL   Calcium 09.6  8.4 - 04.5 mg/dL   GFR calc non Af Amer >60  >60 mL/min   GFR calc Af Amer >60  >60 mL/min  PRO B NATRIURETIC PEPTIDE      Result Value Range   Pro B Natriuretic peptide (BNP) 3638.0 (*) 0 - 450 pg/mL  CBC      Result Value Range   WBC 8.0  4.0 - 10.5 K/uL   RBC 3.26 (*) 3.87 - 5.11 MIL/uL   Hemoglobin 9.1 (*) 12.0 - 15.0 g/dL   HCT 40.9 (*) 81.1 - 91.4 %   MCV 87.1  78.0 - 100.0 fL   MCH 27.9  26.0 - 34.0 pg   MCHC 32.0  30.0 - 36.0 g/dL   RDW 78.2 (*) 95.6 - 21.3 %   Platelets 271  150 - 400 K/uL  PROTIME-INR      Result Value Range   Prothrombin Time 40.9 (*) 11.6 - 15.2 seconds   INR 4.17 (*) 0.00 - 1.49  PRO B NATRIURETIC PEPTIDE      Result Value Range   Pro B Natriuretic peptide (BNP) 4588.0 (*) 0 - 450 pg/mL  COMPREHENSIVE METABOLIC PANEL      Result Value Range   Sodium 139  135 - 145 mEq/L   Potassium 3.3 (*) 3.5 - 5.1 mEq/L   Chloride 101  96 - 112 mEq/L   CO2 31  19 - 32 mEq/L   Glucose, Bld 95  70 - 99 mg/dL   BUN 13  6 - 23 mg/dL   Creatinine, Ser 0.86  0.50 - 1.10 mg/dL   Calcium 57.8  8.4 - 46.9 mg/dL   Total Protein 5.7 (*) 6.0 - 8.3 g/dL   Albumin 2.4 (*) 3.5 - 5.2 g/dL   AST 16  0 - 37 U/L   ALT 13  0 - 35 U/L   Alkaline Phosphatase 81  39 - 117 U/L   Total Bilirubin 0.3  0.3 - 1.2 mg/dL   GFR calc non Af Amer >60  >60 mL/min   GFR calc Af Amer >60  >60 mL/min  CBC      Result Value Range   WBC 6.7  4.0 - 10.5 K/uL   RBC 3.59 (*) 3.87 - 5.11 MIL/uL   Hemoglobin 9.9 (*) 12.0 - 15.0 g/dL   HCT 62.9 (*) 52.8 - 41.3 %   MCV 87.2  78.0 - 100.0 fL   MCH 27.6  26.0 - 34.0 pg   MCHC 31.6  30.0 - 36.0 g/dL   RDW 24.4 (*) 01.0 - 27.2 %   Platelets 262  150 - 400 K/uL   PROTIME-INR      Result Value Range   Prothrombin Time 35.5 (*) 11.6 - 15.2 seconds   INR 3.48 (*) 0.00 - 1.49  BASIC METABOLIC PANEL      Result Value Range   Sodium 140  135 - 145 mEq/L   Potassium 3.3 (*) 3.5 - 5.1 mEq/L   Chloride 102  96 - 112 mEq/L   CO2 33 (*) 19 - 32 mEq/L   Glucose, Bld 92  70 - 99 mg/dL   BUN 11  6 - 23 mg/dL   Creatinine, Ser 5.36  0.50 - 1.10 mg/dL   Calcium 13.0  8.4 - 86.5 mg/dL   GFR calc non Af Amer >60  >60 mL/min   GFR calc Af Amer >60  >60 mL/min  PRO B NATRIURETIC PEPTIDE      Result Value Range   Pro B Natriuretic peptide (BNP) 2958.0 (*) 0 - 450 pg/mL  CBC      Result Value Range   WBC 6.6  4.0 - 10.5 K/uL   RBC 3.76 (*) 3.87 - 5.11 MIL/uL   Hemoglobin 10.4 (*) 12.0 - 15.0 g/dL   HCT 78.4 (*) 69.6 - 29.5 %   MCV 86.7  78.0 - 100.0 fL   MCH 27.7  26.0 - 34.0 pg   MCHC 31.9  30.0 - 36.0 g/dL   RDW 28.4 (*) 13.2 - 44.0 %   Platelets 243  150 - 400 K/uL  PROTIME-INR      Result Value Range   Prothrombin Time 28.5 (*) 11.6 - 15.2 seconds   INR 2.63 (*) 0.00 - 1.49  BASIC METABOLIC PANEL      Result Value Range   Sodium 139  135 - 145 mEq/L   Potassium 3.3 (*) 3.5 - 5.1 mEq/L   Chloride 100  96 - 112 mEq/L   CO2 34 (*) 19 - 32 mEq/L   Glucose, Bld 88  70 - 99 mg/dL   BUN 8  6 - 23 mg/dL   Creatinine, Ser 1.02  0.50 - 1.10 mg/dL   Calcium 72.5  8.4 - 36.6 mg/dL   GFR calc non Af Amer >60  >60 mL/min   GFR calc Af Amer >60  >60 mL/min  GLUCOSE, CAPILLARY      Result Value Range   Glucose-Capillary 140 (*) 70 - 99 mg/dL  PROTIME-INR      Result Value Range   Prothrombin Time 25.0 (*) 11.6 - 15.2 seconds   INR 2.22 (*) 0.00 - 1.49  VANCOMYCIN, TROUGH      Result Value Range   Vancomycin Tr 18.5  10.0 - 20.0 ug/mL  PROTIME-INR      Result Value Range   Prothrombin Time 27.3 (*) 11.6 - 15.2 seconds   INR 2.49 (*) 0.00 - 1.49  CBC      Result Value Range   WBC 6.8  4.0 - 10.5 K/uL   RBC 4.02  3.87 - 5.11 MIL/uL   Hemoglobin  11.2 (*) 12.0 - 15.0 g/dL   HCT 44.0 (*) 34.7 - 42.5 %   MCV 85.8  78.0 - 100.0 fL   MCH 27.9  26.0 - 34.0 pg   MCHC 32.5  30.0 - 36.0 g/dL   RDW 95.6  38.7 - 56.4 %   Platelets 223  150 - 400 K/uL  PROTIME-INR      Result Value Range   Prothrombin Time 23.7 (*) 11.6 - 15.2 seconds   INR 2.07 (*) 0.00 - 1.49  COMPREHENSIVE METABOLIC PANEL      Result Value Range   Sodium 136  135 - 145 mEq/L   Potassium 4.0  3.5 - 5.1 mEq/L   Chloride 101  96 - 112 mEq/L   CO2 29  19 - 32 mEq/L   Glucose, Bld 98  70 - 99 mg/dL   BUN 4 (*) 6 - 23 mg/dL   Creatinine, Ser 3.32  0.50 - 1.10 mg/dL   Calcium 95.1 (*) 8.4 - 10.5 mg/dL   Total Protein 6.0  6.0 - 8.3 g/dL  Albumin 2.5 (*) 3.5 - 5.2 g/dL   AST 17  0 - 37 U/L   ALT 9  0 - 35 U/L   Alkaline Phosphatase 75  39 - 117 U/L   Total Bilirubin 0.4  0.3 - 1.2 mg/dL   GFR calc non Af Amer >60  >60 mL/min   GFR calc Af Amer >60  >60 mL/min  URINALYSIS, MICROSCOPIC ONLY      Result Value Range   Color, Urine AMBER (*) YELLOW   APPearance HAZY (*) CLEAR   Specific Gravity, Urine 1.024  1.005 - 1.030   pH 6.0  5.0 - 8.0   Glucose, UA NEGATIVE  NEGATIVE mg/dL   Hgb urine dipstick LARGE (*) NEGATIVE   Bilirubin Urine NEGATIVE  NEGATIVE   Ketones, ur 15 (*) NEGATIVE mg/dL   Protein, ur 30 (*) NEGATIVE mg/dL   Urobilinogen, UA 0.2  0.0 - 1.0 mg/dL   Nitrite POSITIVE (*) NEGATIVE   Leukocytes, UA LARGE (*) NEGATIVE   WBC, UA 21-50  <3 WBC/hpf   RBC / HPF 0-2  <3 RBC/hpf   Bacteria, UA FEW (*) RARE   Squamous Epithelial / LPF FEW (*) RARE   Casts HYALINE CASTS (*) NEGATIVE   Crystals CA OXALATE CRYSTALS (*) NEGATIVE   Urine-Other MUCOUS PRESENT    CBC      Result Value Range   WBC 5.6  4.0 - 10.5 K/uL   RBC 3.72 (*) 3.87 - 5.11 MIL/uL   Hemoglobin 10.3 (*) 12.0 - 15.0 g/dL   HCT 16.1 (*) 09.6 - 04.5 %   MCV 86.3  78.0 - 100.0 fL   MCH 27.7  26.0 - 34.0 pg   MCHC 32.1  30.0 - 36.0 g/dL   RDW 40.9  81.1 - 91.4 %   Platelets 199  150 - 400  K/uL  PROTIME-INR      Result Value Range   Prothrombin Time 26.3 (*) 11.6 - 15.2 seconds   INR 2.37 (*) 0.00 - 1.49  BASIC METABOLIC PANEL      Result Value Range   Sodium 135  135 - 145 mEq/L   Potassium 3.8  3.5 - 5.1 mEq/L   Chloride 102  96 - 112 mEq/L   CO2 28  19 - 32 mEq/L   Glucose, Bld 128 (*) 70 - 99 mg/dL   BUN 5 (*) 6 - 23 mg/dL   Creatinine, Ser 7.82  0.50 - 1.10 mg/dL   Calcium 95.6 (*) 8.4 - 10.5 mg/dL   GFR calc non Af Amer >60  >60 mL/min   GFR calc Af Amer >60  >60 mL/min  PRO B NATRIURETIC PEPTIDE      Result Value Range   Pro B Natriuretic peptide (BNP) 681.3 (*) 0 - 450 pg/mL  URINALYSIS, ROUTINE W REFLEX MICROSCOPIC      Result Value Range   Color, Urine YELLOW  YELLOW   APPearance CLOUDY (*) CLEAR   Specific Gravity, Urine 1.015  1.005 - 1.030   pH 6.0  5.0 - 8.0   Glucose, UA NEGATIVE  NEGATIVE mg/dL   Hgb urine dipstick MODERATE (*) NEGATIVE   Bilirubin Urine NEGATIVE  NEGATIVE   Ketones, ur NEGATIVE  NEGATIVE mg/dL   Protein, ur NEGATIVE  NEGATIVE mg/dL   Urobilinogen, UA 0.2  0.0 - 1.0 mg/dL   Nitrite NEGATIVE  NEGATIVE   Leukocytes, UA LARGE (*) NEGATIVE  URINE MICROSCOPIC-ADD ON      Result Value Range  Squamous Epithelial / LPF MANY (*) RARE   WBC, UA 21-50  <3 WBC/hpf   RBC / HPF 0-2  <3 RBC/hpf   Bacteria, UA RARE  RARE   Urine-Other MANY YEAST    PROTIME-INR      Result Value Range   Prothrombin Time 26.5 (*) 11.6 - 15.2 seconds   INR 2.39 (*) 0.00 - 1.49  CBC      Result Value Range   WBC 5.7  4.0 - 10.5 K/uL   RBC 3.74 (*) 3.87 - 5.11 MIL/uL   Hemoglobin 10.3 (*) 12.0 - 15.0 g/dL   HCT 40.9 (*) 81.1 - 91.4 %   MCV 85.3  78.0 - 100.0 fL   MCH 27.5  26.0 - 34.0 pg   MCHC 32.3  30.0 - 36.0 g/dL   RDW 78.2  95.6 - 21.3 %   Platelets 174  150 - 400 K/uL  BASIC METABOLIC PANEL      Result Value Range   Sodium 137  135 - 145 mEq/L   Potassium 4.1  3.5 - 5.1 mEq/L   Chloride 103  96 - 112 mEq/L   CO2 28  19 - 32 mEq/L    Glucose, Bld 101 (*) 70 - 99 mg/dL   BUN 4 (*) 6 - 23 mg/dL   Creatinine, Ser <0.86 (*) 0.50 - 1.10 mg/dL   Calcium 57.8  8.4 - 46.9 mg/dL   GFR calc non Af Amer NOT CALCULATED  >60 mL/min   GFR calc Af Amer NOT CALCULATED  >60 mL/min  PROTIME-INR      Result Value Range   Prothrombin Time 28.5 (*) 11.6 - 15.2 seconds   INR 2.63 (*) 0.00 - 1.49  PROTIME-INR      Result Value Range   Prothrombin Time 28.8 (*) 11.6 - 15.2 seconds   INR 2.66 (*) 0.00 - 1.49  DIFFERENTIAL      Result Value Range   Neutrophils Relative % 68  43 - 77 %   Neutro Abs 4.8  1.7 - 7.7 K/uL   Lymphocytes Relative 21  12 - 46 %   Lymphs Abs 1.4  0.7 - 4.0 K/uL   Monocytes Relative 9  3 - 12 %   Monocytes Absolute 0.6  0.1 - 1.0 K/uL   Eosinophils Relative 2  0 - 5 %   Eosinophils Absolute 0.2  0.0 - 0.7 K/uL   Basophils Relative 0  0 - 1 %   Basophils Absolute 0.0  0.0 - 0.1 K/uL  CBC      Result Value Range   WBC 7.0  4.0 - 10.5 K/uL   RBC 3.64 (*) 3.87 - 5.11 MIL/uL   Hemoglobin 9.9 (*) 12.0 - 15.0 g/dL   HCT 62.9 (*) 52.8 - 41.3 %   MCV 84.3  78.0 - 100.0 fL   MCH 27.2  26.0 - 34.0 pg   MCHC 32.2  30.0 - 36.0 g/dL   RDW 24.4  01.0 - 27.2 %   Platelets 200  150 - 400 K/uL  PROTIME-INR      Result Value Range   Prothrombin Time 28.8 (*) 11.6 - 15.2 seconds   INR 2.66 (*) 0.00 - 1.49  COMPREHENSIVE METABOLIC PANEL      Result Value Range   Sodium 136  135 - 145 mEq/L   Potassium 3.9  3.5 - 5.1 mEq/L   Chloride 103  96 - 112 mEq/L   CO2 25  19 - 32  mEq/L   Glucose, Bld 106 (*) 70 - 99 mg/dL   BUN 6  6 - 23 mg/dL   Creatinine, Ser 4.09 (*) 0.50 - 1.10 mg/dL   Calcium 81.1  8.4 - 91.4 mg/dL   Total Protein 5.6 (*) 6.0 - 8.3 g/dL   Albumin 2.4 (*) 3.5 - 5.2 g/dL   AST 10  0 - 37 U/L   ALT 7  0 - 35 U/L   Alkaline Phosphatase 68  39 - 117 U/L   Total Bilirubin 0.3  0.3 - 1.2 mg/dL   GFR calc non Af Amer >60  >60 mL/min   GFR calc Af Amer >60  >60 mL/min  CBC      Result Value Range   WBC  6.3  4.0 - 10.5 K/uL   RBC 3.89  3.87 - 5.11 MIL/uL   Hemoglobin 10.7 (*) 12.0 - 15.0 g/dL   HCT 78.2 (*) 95.6 - 21.3 %   MCV 84.3  78.0 - 100.0 fL   MCH 27.5  26.0 - 34.0 pg   MCHC 32.6  30.0 - 36.0 g/dL   RDW 08.6  57.8 - 46.9 %   Platelets 215  150 - 400 K/uL  PROTIME-INR      Result Value Range   Prothrombin Time 27.2 (*) 11.6 - 15.2 seconds   INR 2.47 (*) 0.00 - 1.49  COMPREHENSIVE METABOLIC PANEL      Result Value Range   Sodium 137  135 - 145 mEq/L   Potassium 4.0  3.5 - 5.1 mEq/L   Chloride 102  96 - 112 mEq/L   CO2 29  19 - 32 mEq/L   Glucose, Bld 95  70 - 99 mg/dL   BUN 6  6 - 23 mg/dL   Creatinine, Ser 6.29  0.50 - 1.10 mg/dL   Calcium 52.8  8.4 - 41.3 mg/dL   Total Protein 5.9 (*) 6.0 - 8.3 g/dL   Albumin 2.7 (*) 3.5 - 5.2 g/dL   AST 11  0 - 37 U/L   ALT 8  0 - 35 U/L   Alkaline Phosphatase 71  39 - 117 U/L   Total Bilirubin 0.4  0.3 - 1.2 mg/dL   GFR calc non Af Amer >60  >60 mL/min   GFR calc Af Amer >60  >60 mL/min  PROTIME-INR      Result Value Range   Prothrombin Time 27.1 (*) 11.6 - 15.2 seconds   INR 2.46 (*) 0.00 - 1.49  CBC      Result Value Range   WBC 5.5  4.0 - 10.5 K/uL   RBC 3.68 (*) 3.87 - 5.11 MIL/uL   Hemoglobin 10.1 (*) 12.0 - 15.0 g/dL   HCT 24.4 (*) 01.0 - 27.2 %   MCV 85.3  78.0 - 100.0 fL   MCH 27.4  26.0 - 34.0 pg   MCHC 32.2  30.0 - 36.0 g/dL   RDW 53.6 (*) 64.4 - 03.4 %   Platelets 223  150 - 400 K/uL  BASIC METABOLIC PANEL      Result Value Range   Sodium 139  135 - 145 mEq/L   Potassium 3.9  3.5 - 5.1 mEq/L   Chloride 105  96 - 112 mEq/L   CO2 26  19 - 32 mEq/L   Glucose, Bld 135 (*) 70 - 99 mg/dL   BUN 6  6 - 23 mg/dL   Creatinine, Ser 7.42 (*) 0.50 - 1.10 mg/dL   Calcium  10.2  8.4 - 10.5 mg/dL   GFR calc non Af Amer >60  >60 mL/min   GFR calc Af Amer >60  >60 mL/min   Ct Head Wo Contrast  07/14/2012   *RADIOLOGY REPORT*  Clinical Data:  Fall, head laceration  CT HEAD WITHOUT CONTRAST CT CERVICAL SPINE WITHOUT CONTRAST   Technique:  Multidetector CT imaging of the head and cervical spine was performed following the standard protocol without intravenous contrast.  Multiplanar CT image reconstructions of the cervical spine were also generated.  Comparison:  CT head dated 11/13/2008  CT HEAD  Findings: No evidence of parenchymal hemorrhage or extra-axial fluid collection.  2.2 cm meningioma overlying the left frontal lobe (series 3/image 21 close previously 1.9 cm.  1.7 cm meningioma near the left CP angle (series 3/image 7), previously 1.4 cm.  No mass effect or midline shift.  No CT evidence of acute infarction.  Subcortical white matter and periventricular small vessel ischemic changes.  Global cortical atrophy.  Stable secondary prominence of the bifrontal extra-axial spaces.  The visualized paranasal sinuses are essentially clear. The mastoid air cells are unopacified.  Very mild soft tissue swelling overlying the left lateral frontal bone/zygoma.  No evidence of calvarial fracture.  IMPRESSION: Very mild soft tissue swelling overlying the left lateral frontal bone/zygoma.  No evidence of calvarial fracture.  Benign meningiomas overlying the left frontal lobe and at the left CP angle, mildly increased since 2010, but unlikely to be clinically significant.  Stable atrophy with mild small vessel ischemic changes.  CT CERVICAL SPINE  Findings: Exaggerated cervical lordosis.  No evidence of fracture or dislocation.  Vertebral body heights are maintained.  Dens appears intact.  Mild to moderate degenerative changes, most prominent at C6-7.  Visualized thyroid is heterogeneous with coarse calcifications on the right.  Visualized lung apices are notable for emphysematous changes.  IMPRESSION: No evidence of traumatic injury to the cervical spine.  Mild to moderate degenerative changes.   Original Report Authenticated By: Charline Bills, M.D.   Ct Cervical Spine Wo Contrast  07/14/2012   *RADIOLOGY REPORT*  Clinical Data:  Fall, head  laceration  CT HEAD WITHOUT CONTRAST CT CERVICAL SPINE WITHOUT CONTRAST  Technique:  Multidetector CT imaging of the head and cervical spine was performed following the standard protocol without intravenous contrast.  Multiplanar CT image reconstructions of the cervical spine were also generated.  Comparison:  CT head dated 11/13/2008  CT HEAD  Findings: No evidence of parenchymal hemorrhage or extra-axial fluid collection.  2.2 cm meningioma overlying the left frontal lobe (series 3/image 21 close previously 1.9 cm.  1.7 cm meningioma near the left CP angle (series 3/image 7), previously 1.4 cm.  No mass effect or midline shift.  No CT evidence of acute infarction.  Subcortical white matter and periventricular small vessel ischemic changes.  Global cortical atrophy.  Stable secondary prominence of the bifrontal extra-axial spaces.  The visualized paranasal sinuses are essentially clear. The mastoid air cells are unopacified.  Very mild soft tissue swelling overlying the left lateral frontal bone/zygoma.  No evidence of calvarial fracture.  IMPRESSION: Very mild soft tissue swelling overlying the left lateral frontal bone/zygoma.  No evidence of calvarial fracture.  Benign meningiomas overlying the left frontal lobe and at the left CP angle, mildly increased since 2010, but unlikely to be clinically significant.  Stable atrophy with mild small vessel ischemic changes.  CT CERVICAL SPINE  Findings: Exaggerated cervical lordosis.  No evidence of fracture or dislocation.  Vertebral  body heights are maintained.  Dens appears intact.  Mild to moderate degenerative changes, most prominent at C6-7.  Visualized thyroid is heterogeneous with coarse calcifications on the right.  Visualized lung apices are notable for emphysematous changes.  IMPRESSION: No evidence of traumatic injury to the cervical spine.  Mild to moderate degenerative changes.   Original Report Authenticated By: Charline Bills, M.D.    LACERATION  REPAIR Performed by: Roxy Horseman Authorized by: Roxy Horseman Consent: Verbal consent obtained. Risks and benefits: risks, benefits and alternatives were discussed Consent given by: patient Patient identity confirmed: provided demographic data Prepped and Draped in normal sterile fashion Wound explored  Laceration Location: left eyebrow  Laceration Length: 3 cm  No Foreign Bodies seen or palpated  Anesthesia: none  Local anesthetic:none  Anesthetic total: none  Irrigation method: syringe Amount of cleaning: standard  Skin closure: dermabond  Number of sutures: dermabond  Technique: dermabond  Patient tolerance: Patient tolerated the procedure well with no immediate complications.   1. Fall, initial encounter   2. Laceration of head, initial encounter       MDM  Patient with mechanical fall and small head laceration. As the patient is anticoagulated and 79 years old, I will order a head CT to rule out intracranial process. Will also check a cervical spine CT, as the patient is complaining of associated neck pain. Will update the patient's tetanus shot. I do not anticipate sutures being required to repair the laceration, I believe that will easily be repaired with dermabond.  11:09 AM Patient seen by and discussed with Dr. Patria Mane.  Discharge to home.  CTs are negative.  No AMS.  Lac repaired.  Tetanus updated.        Roxy Horseman, PA-C 07/14/12 1110

## 2012-07-15 DIAGNOSIS — G894 Chronic pain syndrome: Secondary | ICD-10-CM | POA: Diagnosis not present

## 2012-07-15 DIAGNOSIS — Z79899 Other long term (current) drug therapy: Secondary | ICD-10-CM | POA: Diagnosis not present

## 2012-07-15 DIAGNOSIS — M5137 Other intervertebral disc degeneration, lumbosacral region: Secondary | ICD-10-CM | POA: Diagnosis not present

## 2012-07-15 DIAGNOSIS — IMO0001 Reserved for inherently not codable concepts without codable children: Secondary | ICD-10-CM | POA: Diagnosis not present

## 2012-07-17 DIAGNOSIS — I4891 Unspecified atrial fibrillation: Secondary | ICD-10-CM | POA: Diagnosis not present

## 2012-07-17 DIAGNOSIS — Z7901 Long term (current) use of anticoagulants: Secondary | ICD-10-CM | POA: Diagnosis not present

## 2012-08-19 DIAGNOSIS — R609 Edema, unspecified: Secondary | ICD-10-CM | POA: Diagnosis not present

## 2012-08-19 DIAGNOSIS — N182 Chronic kidney disease, stage 2 (mild): Secondary | ICD-10-CM | POA: Diagnosis not present

## 2012-08-19 DIAGNOSIS — I509 Heart failure, unspecified: Secondary | ICD-10-CM | POA: Diagnosis not present

## 2012-08-19 DIAGNOSIS — G894 Chronic pain syndrome: Secondary | ICD-10-CM | POA: Diagnosis not present

## 2012-08-19 DIAGNOSIS — Z7901 Long term (current) use of anticoagulants: Secondary | ICD-10-CM | POA: Diagnosis not present

## 2012-08-19 DIAGNOSIS — Z79899 Other long term (current) drug therapy: Secondary | ICD-10-CM | POA: Diagnosis not present

## 2012-08-19 DIAGNOSIS — I4891 Unspecified atrial fibrillation: Secondary | ICD-10-CM | POA: Diagnosis not present

## 2012-08-28 DIAGNOSIS — M19019 Primary osteoarthritis, unspecified shoulder: Secondary | ICD-10-CM | POA: Diagnosis not present

## 2012-08-28 DIAGNOSIS — R609 Edema, unspecified: Secondary | ICD-10-CM | POA: Diagnosis not present

## 2012-08-28 DIAGNOSIS — Z6832 Body mass index (BMI) 32.0-32.9, adult: Secondary | ICD-10-CM | POA: Diagnosis not present

## 2012-09-09 DIAGNOSIS — I872 Venous insufficiency (chronic) (peripheral): Secondary | ICD-10-CM | POA: Diagnosis not present

## 2012-09-09 DIAGNOSIS — I509 Heart failure, unspecified: Secondary | ICD-10-CM | POA: Diagnosis not present

## 2012-09-09 DIAGNOSIS — Z6830 Body mass index (BMI) 30.0-30.9, adult: Secondary | ICD-10-CM | POA: Diagnosis not present

## 2012-09-09 DIAGNOSIS — I10 Essential (primary) hypertension: Secondary | ICD-10-CM | POA: Diagnosis not present

## 2012-09-09 DIAGNOSIS — Z7901 Long term (current) use of anticoagulants: Secondary | ICD-10-CM | POA: Diagnosis not present

## 2012-09-09 DIAGNOSIS — N182 Chronic kidney disease, stage 2 (mild): Secondary | ICD-10-CM | POA: Diagnosis not present

## 2012-09-09 DIAGNOSIS — I4891 Unspecified atrial fibrillation: Secondary | ICD-10-CM | POA: Diagnosis not present

## 2012-09-09 DIAGNOSIS — L989 Disorder of the skin and subcutaneous tissue, unspecified: Secondary | ICD-10-CM | POA: Diagnosis not present

## 2012-09-16 DIAGNOSIS — I4891 Unspecified atrial fibrillation: Secondary | ICD-10-CM | POA: Diagnosis not present

## 2012-09-16 DIAGNOSIS — IMO0001 Reserved for inherently not codable concepts without codable children: Secondary | ICD-10-CM | POA: Diagnosis not present

## 2012-09-16 DIAGNOSIS — Z7901 Long term (current) use of anticoagulants: Secondary | ICD-10-CM | POA: Diagnosis not present

## 2012-09-16 DIAGNOSIS — Z79899 Other long term (current) drug therapy: Secondary | ICD-10-CM | POA: Diagnosis not present

## 2012-09-16 DIAGNOSIS — M5137 Other intervertebral disc degeneration, lumbosacral region: Secondary | ICD-10-CM | POA: Diagnosis not present

## 2012-09-16 DIAGNOSIS — G894 Chronic pain syndrome: Secondary | ICD-10-CM | POA: Diagnosis not present

## 2012-09-16 DIAGNOSIS — I872 Venous insufficiency (chronic) (peripheral): Secondary | ICD-10-CM | POA: Diagnosis not present

## 2012-10-10 DIAGNOSIS — Z7901 Long term (current) use of anticoagulants: Secondary | ICD-10-CM | POA: Diagnosis not present

## 2012-10-10 DIAGNOSIS — I4891 Unspecified atrial fibrillation: Secondary | ICD-10-CM | POA: Diagnosis not present

## 2012-10-23 DIAGNOSIS — Z7901 Long term (current) use of anticoagulants: Secondary | ICD-10-CM | POA: Diagnosis not present

## 2012-10-23 DIAGNOSIS — IMO0001 Reserved for inherently not codable concepts without codable children: Secondary | ICD-10-CM | POA: Diagnosis not present

## 2012-10-23 DIAGNOSIS — IMO0002 Reserved for concepts with insufficient information to code with codable children: Secondary | ICD-10-CM | POA: Diagnosis not present

## 2012-10-23 DIAGNOSIS — Z79899 Other long term (current) drug therapy: Secondary | ICD-10-CM | POA: Diagnosis not present

## 2012-10-23 DIAGNOSIS — I4891 Unspecified atrial fibrillation: Secondary | ICD-10-CM | POA: Diagnosis not present

## 2012-10-23 DIAGNOSIS — G894 Chronic pain syndrome: Secondary | ICD-10-CM | POA: Diagnosis not present

## 2012-10-30 DIAGNOSIS — N182 Chronic kidney disease, stage 2 (mild): Secondary | ICD-10-CM | POA: Diagnosis not present

## 2012-10-30 DIAGNOSIS — Z1331 Encounter for screening for depression: Secondary | ICD-10-CM | POA: Diagnosis not present

## 2012-10-30 DIAGNOSIS — Z7901 Long term (current) use of anticoagulants: Secondary | ICD-10-CM | POA: Diagnosis not present

## 2012-10-30 DIAGNOSIS — I872 Venous insufficiency (chronic) (peripheral): Secondary | ICD-10-CM | POA: Diagnosis not present

## 2012-10-30 DIAGNOSIS — Z23 Encounter for immunization: Secondary | ICD-10-CM | POA: Diagnosis not present

## 2012-10-30 DIAGNOSIS — F411 Generalized anxiety disorder: Secondary | ICD-10-CM | POA: Diagnosis not present

## 2012-10-30 DIAGNOSIS — J309 Allergic rhinitis, unspecified: Secondary | ICD-10-CM | POA: Diagnosis not present

## 2012-10-30 DIAGNOSIS — I2510 Atherosclerotic heart disease of native coronary artery without angina pectoris: Secondary | ICD-10-CM | POA: Diagnosis not present

## 2012-10-30 DIAGNOSIS — E039 Hypothyroidism, unspecified: Secondary | ICD-10-CM | POA: Diagnosis not present

## 2012-10-30 DIAGNOSIS — I10 Essential (primary) hypertension: Secondary | ICD-10-CM | POA: Diagnosis not present

## 2012-11-15 DIAGNOSIS — Z7901 Long term (current) use of anticoagulants: Secondary | ICD-10-CM | POA: Diagnosis not present

## 2012-11-15 DIAGNOSIS — I4891 Unspecified atrial fibrillation: Secondary | ICD-10-CM | POA: Diagnosis not present

## 2012-11-27 DIAGNOSIS — M5137 Other intervertebral disc degeneration, lumbosacral region: Secondary | ICD-10-CM | POA: Diagnosis not present

## 2012-11-27 DIAGNOSIS — Z79899 Other long term (current) drug therapy: Secondary | ICD-10-CM | POA: Diagnosis not present

## 2012-11-27 DIAGNOSIS — G894 Chronic pain syndrome: Secondary | ICD-10-CM | POA: Diagnosis not present

## 2012-11-27 DIAGNOSIS — IMO0001 Reserved for inherently not codable concepts without codable children: Secondary | ICD-10-CM | POA: Diagnosis not present

## 2012-12-16 DIAGNOSIS — I4891 Unspecified atrial fibrillation: Secondary | ICD-10-CM | POA: Diagnosis not present

## 2012-12-16 DIAGNOSIS — Z7901 Long term (current) use of anticoagulants: Secondary | ICD-10-CM | POA: Diagnosis not present

## 2013-01-13 DIAGNOSIS — R0609 Other forms of dyspnea: Secondary | ICD-10-CM | POA: Diagnosis not present

## 2013-01-13 DIAGNOSIS — I119 Hypertensive heart disease without heart failure: Secondary | ICD-10-CM | POA: Diagnosis not present

## 2013-01-13 DIAGNOSIS — I4891 Unspecified atrial fibrillation: Secondary | ICD-10-CM | POA: Diagnosis not present

## 2013-01-13 DIAGNOSIS — E785 Hyperlipidemia, unspecified: Secondary | ICD-10-CM | POA: Diagnosis not present

## 2013-01-13 DIAGNOSIS — E669 Obesity, unspecified: Secondary | ICD-10-CM | POA: Diagnosis not present

## 2013-01-13 DIAGNOSIS — I059 Rheumatic mitral valve disease, unspecified: Secondary | ICD-10-CM | POA: Diagnosis not present

## 2013-01-13 DIAGNOSIS — Z7901 Long term (current) use of anticoagulants: Secondary | ICD-10-CM | POA: Diagnosis not present

## 2013-01-25 DIAGNOSIS — M19019 Primary osteoarthritis, unspecified shoulder: Secondary | ICD-10-CM | POA: Diagnosis not present

## 2013-01-28 ENCOUNTER — Encounter (HOSPITAL_COMMUNITY): Payer: Self-pay | Admitting: Emergency Medicine

## 2013-01-28 ENCOUNTER — Emergency Department (HOSPITAL_COMMUNITY)
Admission: EM | Admit: 2013-01-28 | Discharge: 2013-01-28 | Disposition: A | Discharge status: Home / Self Care | Payer: Medicare Other | Attending: Emergency Medicine | Admitting: Emergency Medicine

## 2013-01-28 ENCOUNTER — Emergency Department (HOSPITAL_COMMUNITY): Payer: Medicare Other

## 2013-01-28 DIAGNOSIS — E876 Hypokalemia: Secondary | ICD-10-CM

## 2013-01-28 DIAGNOSIS — M545 Low back pain, unspecified: Secondary | ICD-10-CM | POA: Diagnosis not present

## 2013-01-28 DIAGNOSIS — M7989 Other specified soft tissue disorders: Secondary | ICD-10-CM | POA: Insufficient documentation

## 2013-01-28 DIAGNOSIS — G8929 Other chronic pain: Secondary | ICD-10-CM | POA: Diagnosis not present

## 2013-01-28 DIAGNOSIS — F410 Panic disorder [episodic paroxysmal anxiety] without agoraphobia: Secondary | ICD-10-CM | POA: Insufficient documentation

## 2013-01-28 DIAGNOSIS — I2510 Atherosclerotic heart disease of native coronary artery without angina pectoris: Secondary | ICD-10-CM | POA: Insufficient documentation

## 2013-01-28 DIAGNOSIS — Z79899 Other long term (current) drug therapy: Secondary | ICD-10-CM | POA: Insufficient documentation

## 2013-01-28 DIAGNOSIS — E079 Disorder of thyroid, unspecified: Secondary | ICD-10-CM | POA: Diagnosis not present

## 2013-01-28 DIAGNOSIS — Z7901 Long term (current) use of anticoagulants: Secondary | ICD-10-CM | POA: Insufficient documentation

## 2013-01-28 DIAGNOSIS — Z7982 Long term (current) use of aspirin: Secondary | ICD-10-CM | POA: Insufficient documentation

## 2013-01-28 DIAGNOSIS — R5381 Other malaise: Secondary | ICD-10-CM | POA: Diagnosis not present

## 2013-01-28 DIAGNOSIS — R609 Edema, unspecified: Secondary | ICD-10-CM | POA: Insufficient documentation

## 2013-01-28 DIAGNOSIS — I10 Essential (primary) hypertension: Secondary | ICD-10-CM | POA: Diagnosis not present

## 2013-01-28 DIAGNOSIS — N390 Urinary tract infection, site not specified: Secondary | ICD-10-CM

## 2013-01-28 LAB — CBC WITH DIFFERENTIAL/PLATELET
Basophils Absolute: 0 10*3/uL (ref 0.0–0.1)
Eosinophils Relative: 0 % (ref 0–5)
Lymphocytes Relative: 25 % (ref 12–46)
MCV: 88.9 fL (ref 78.0–100.0)
Platelets: 273 10*3/uL (ref 150–400)
RDW: 14.4 % (ref 11.5–15.5)
WBC: 11.4 10*3/uL — ABNORMAL HIGH (ref 4.0–10.5)

## 2013-01-28 LAB — POCT I-STAT TROPONIN I: Troponin i, poc: 0 ng/mL (ref 0.00–0.08)

## 2013-01-28 LAB — URINE MICROSCOPIC-ADD ON

## 2013-01-28 LAB — URINALYSIS, ROUTINE W REFLEX MICROSCOPIC
Glucose, UA: NEGATIVE mg/dL
Specific Gravity, Urine: 1.02 (ref 1.005–1.030)
pH: 8 (ref 5.0–8.0)

## 2013-01-28 LAB — COMPREHENSIVE METABOLIC PANEL
ALT: 22 U/L (ref 0–35)
AST: 32 U/L (ref 0–37)
Albumin: 4.3 g/dL (ref 3.5–5.2)
CO2: 31 mEq/L (ref 19–32)
Calcium: 10.8 mg/dL — ABNORMAL HIGH (ref 8.4–10.5)
GFR calc non Af Amer: 76 mL/min — ABNORMAL LOW (ref >90)
Sodium: 140 mEq/L (ref 135–145)
Total Protein: 7.8 g/dL (ref 6.0–8.3)

## 2013-01-28 MED ORDER — NITROFURANTOIN MONOHYD MACRO 100 MG PO CAPS
100.0000 mg | ORAL_CAPSULE | Freq: Once | ORAL | Status: AC
  Administered 2013-01-28: 100 mg via ORAL
  Filled 2013-01-28: qty 1

## 2013-01-28 MED ORDER — HYDROCODONE-ACETAMINOPHEN 5-325 MG PO TABS
1.0000 | ORAL_TABLET | Freq: Once | ORAL | Status: AC
  Administered 2013-01-28: 1 via ORAL
  Filled 2013-01-28: qty 1

## 2013-01-28 MED ORDER — DILTIAZEM HCL ER COATED BEADS 240 MG PO CP24
240.0000 mg | ORAL_CAPSULE | Freq: Once | ORAL | Status: AC
  Administered 2013-01-28: 240 mg via ORAL
  Filled 2013-01-28: qty 1

## 2013-01-28 MED ORDER — POTASSIUM CHLORIDE ER 10 MEQ PO TBCR: EXTENDED_RELEASE_TABLET | ORAL | Status: DC

## 2013-01-28 MED ORDER — NITROFURANTOIN MONOHYD MACRO 100 MG PO CAPS: 100.0000 mg | ORAL_CAPSULE | Freq: Two times a day (BID) | ORAL | Status: DC

## 2013-01-28 MED ORDER — ALPRAZOLAM 0.25 MG PO TABS
0.5000 mg | ORAL_TABLET | Freq: Once | ORAL | Status: AC
  Administered 2013-01-28: 0.5 mg via ORAL
  Filled 2013-01-28: qty 2

## 2013-01-28 NOTE — ED Notes (Signed)
Pt reports waking up this am and not feeling well. Multiple complaints: Having generalized fatigue, frequent urination, generalized abd pain and hypertension. Pt reports not taking her fluid pills this am due to frequent urination. No acute distress noted at triage.

## 2013-01-28 NOTE — ED Notes (Signed)
Pt is requesting a "nerve pill"

## 2013-01-28 NOTE — ED Notes (Signed)
Pt co generalized weakness this AM, was hypertensive per son. Pt states she is on diuretics as needed for left leg swelling. Has not taken medication since yesterday. Per Family pt is unclear if she should take them everyday or as needed.

## 2013-01-28 NOTE — ED Provider Notes (Signed)
CSN: [id number]     Arrival date & time 01/28/13  1203 History   First MD Initiated Contact with Patient 01/28/13 1503     Chief Complaint  Patient presents with  . Hypertension  . Weakness    HPI  Patient presents with her 2 sons. Main complaint is her blood pressure was high at home. She wasn't feeling well today her son checked her blood pressure at home. 3 240/120. She has not taken her Cardizem CD since yesterday morning. She exhibits swelling in her left leg. She takes Lasix when necessary. She started taking Lasix again 3 days ago because Lotrimin swelling. This is improved. States he is still urinating every 10 or 15 minutes he states it is uncomfortable burning when she urinates. She describes the pubic discomfort. Also has low back pain. Has some chronic low back pain with a history of "disc disease". Per the son's report.  No fever. No shortness of breath. No nausea vomiting. Her appetite is "okay". Normal bowel movement this morning. No constipation or diarrhea. No blood in her urine.  Past Medical History  Diagnosis Date  . Hypertension   . Thyroid disease   . Coronary artery disease   . Panic attacks    Past Surgical History  Procedure Laterality Date  . Joint replacement     History reviewed. No pertinent family history. History  Substance Use Topics  . Smoking status: Not on file  . Smokeless tobacco: Not on file  . Alcohol Use: No   OB History   Grav Para Term Preterm Abortions TAB SAB Ect Mult Living                 Review of Systems  Constitutional: Positive for fatigue. Negative for fever, chills, diaphoresis and appetite change.  HENT: Negative for mouth sores, sore throat and trouble swallowing.   Eyes: Negative for visual disturbance.  Respiratory: Negative for cough, chest tightness, shortness of breath and wheezing.   Cardiovascular: Negative for chest pain.  Gastrointestinal: Negative for nausea, vomiting, abdominal pain, diarrhea and abdominal  distention.       Suprapubic pain   Endocrine: Positive for polyuria. Negative for polydipsia and polyphagia.  Genitourinary: Positive for frequency and flank pain. Negative for dysuria and hematuria.  Musculoskeletal: Positive for back pain. Negative for gait problem.  Skin: Negative for color change, pallor and rash.  Neurological: Positive for weakness. Negative for dizziness, syncope, light-headedness and headaches.  Hematological: Does not bruise/bleed easily.  Psychiatric/Behavioral: Negative for behavioral problems and confusion.    Allergies  Codeine  Home Medications   Current Outpatient Rx  Name  Route  Sig  Dispense  Refill  . ALPRAZolam (XANAX) 0.5 MG tablet   Oral   Take 0.5-1 mg by mouth 2 (two) times daily. Takes 0.5mg  in the morning and 1mg  at bedtime         . aspirin EC 81 MG tablet   Oral   Take 81 mg by mouth daily.         Marland Kitchen diltiazem (CARDIZEM CD) 240 MG 24 hr capsule   Oral   Take 240 mg by mouth daily.         . DULoxetine (CYMBALTA) 30 MG capsule   Oral   Take 90 mg by mouth daily.         . fentaNYL (DURAGESIC - DOSED MCG/HR) 50 MCG/HR   Transdermal   Place 50 mcg onto the skin every 3 (three) days.         Marland Kitchen  furosemide (LASIX) 80 MG tablet   Oral   Take 80 mg by mouth daily.         Marland Kitchen HYDROcodone-acetaminophen (NORCO) 10-325 MG per tablet   Oral   Take 0.5-1 tablets by mouth every 6 (six) hours as needed.         Marland Kitchen levothyroxine (SYNTHROID, LEVOTHROID) 137 MCG tablet   Oral   Take 137 mcg by mouth daily before breakfast.         . saccharomyces boulardii (FLORASTOR) 250 MG capsule   Oral   Take 250 mg by mouth 2 (two) times daily.         . tamsulosin (FLOMAX) 0.4 MG CAPS capsule   Oral   Take 0.4 mg by mouth daily.         Marland Kitchen warfarin (COUMADIN) 2.5 MG tablet   Oral   Take 2.5-3.75 mg by mouth daily. Takes 3.75mg  on Monday, Wednesday, and Friday. Takes 2.5mg  all other days         . nitrofurantoin,  macrocrystal-monohydrate, (MACROBID) 100 MG capsule   Oral   Take 1 capsule (100 mg total) by mouth 2 (two) times daily.   10 capsule   0   . potassium chloride (K-DUR) 10 MEQ tablet      Take a dose of one tablet every time you take a  Lasix dose.   30 tablet   0    BP 185/104  Pulse 86  Temp(Src) 97.5 F (36.4 C) (Oral)  Resp 15  SpO2 97% Physical Exam  Constitutional: She is oriented to person, place, and time. She appears well-developed. No distress.  And, elderly-appearing female. Awake alert and oriented.  HENT:  Head: Normocephalic.  Conjunctiva are not pale. Mucus membranes are not dried.  Eyes: Conjunctivae are normal. Pupils are equal, round, and reactive to light. No scleral icterus.  Neck: Normal range of motion. Neck supple. No JVD present. No thyromegaly present.  Cardiovascular: Normal rate, regular rhythm, S1 normal and S2 normal.  Exam reveals no gallop, no S3, no S4 and no friction rub.   No murmur heard. Pulmonary/Chest: Effort normal and breath sounds normal. No respiratory distress. She has no wheezes. She has no rales.  Breath sounds. No crackles or rales.  Abdominal: Soft. Bowel sounds are normal. She exhibits no distension. There is tenderness in the suprapubic area. There is no rigidity, no rebound and no guarding.  Musculoskeletal: Normal range of motion.       Back:  Lymphadenopathy:  1+ lower extremity edema to left  Neurological: She is alert and oriented to person, place, and time.  (. Alert and oriented.  Skin: Skin is warm and dry. No rash noted.  Psychiatric: She has a normal mood and affect. Her behavior is normal.    ED Course  Procedures (including critical care time) Labs Review Labs Reviewed  CBC WITH DIFFERENTIAL - Abnormal; Notable for the following:    WBC 11.4 (*)    RBC 5.22 (*)    Hemoglobin 16.2 (*)    HCT 46.4 (*)    Monocytes Absolute 1.2 (*)    All other components within normal limits  COMPREHENSIVE METABOLIC PANEL  - Abnormal; Notable for the following:    Potassium 3.3 (*)    Calcium 10.8 (*)    Alkaline Phosphatase 132 (*)    GFR calc non Af Amer 76 (*)    GFR calc Af Amer 88 (*)    All other components within normal limits  URINALYSIS, ROUTINE W REFLEX MICROSCOPIC - Abnormal; Notable for the following:    Hgb urine dipstick SMALL (*)    Protein, ur 30 (*)    Leukocytes, UA SMALL (*)    All other components within normal limits  URINE MICROSCOPIC-ADD ON - Abnormal; Notable for the following:    Squamous Epithelial / LPF FEW (*)    Bacteria, UA FEW (*)    All other components within normal limits  URINE CULTURE  LIPASE, BLOOD  POCT I-STAT TROPONIN I   Imaging Review Dg Chest Port 1 View  01/28/2013   CLINICAL DATA:  79 year old female weakness and hypertension. Initial encounter.  EXAM: PORTABLE CHEST - 1 VIEW  COMPARISON:  10/06/2010 and earlier.  FINDINGS: Portable AP semi upright view at at 1639 hrs. Stable lung volumes. Stable cardiomegaly and mediastinal contours. No pneumothorax, pulmonary edema, pleural effusion or confluent pulmonary opacity.  IMPRESSION: No acute cardiopulmonary abnormality.   Electronically Signed   By: Augusto Gamble M.D.   On: 01/28/2013 16:47    EKG Interpretation   None       MDM   1. UTI (lower urinary tract infection)   2. Hypokalemia     She reports urinary frequency. This may be infectious. This may be from her resuming her Lasix dose. With the weakness will be to rule out infection and electrolyte abnormalities with Lasix use. Clinically does not appear in any dresser distress hypoxemia or congestive heart failure. EKG chest x-ray labs and urinalysis are pending. Will treat her blood pressure by treating her pain and given her daily Cardizem CD and following her blood pressures here.    Roney Marion, MD 01/30/13 Marlyne Beards

## 2013-01-30 LAB — URINE CULTURE: Colony Count: >=100000

## 2013-02-01 ENCOUNTER — Telehealth (HOSPITAL_COMMUNITY): Payer: Self-pay | Admitting: Emergency Medicine

## 2013-02-01 NOTE — ED Notes (Signed)
Post ED Visit - Positive Culture Follow-up  Culture report reviewed by antimicrobial stewardship pharmacist: []  Wes Dulaney, Pharm.D., BCPS [x]  Celedonio Miyamoto, [address].D., BCPS []  Georgina Pillion, Pharm.D., BCPS []  [place], [address].D., BCPS, AAHIVP []  Estella Husk, Pharm.D., BCPS, AAHIVP  Positive urine culture Treated with Macrobid, organism sensitive to the same and no further patient follow-up is required at this time.  Kylie A Holland 02/01/2013, 12:24 PM

## 2013-02-03 DIAGNOSIS — Z6826 Body mass index (BMI) 26.0-26.9, adult: Secondary | ICD-10-CM | POA: Diagnosis not present

## 2013-02-03 DIAGNOSIS — I10 Essential (primary) hypertension: Secondary | ICD-10-CM | POA: Diagnosis not present

## 2013-02-03 DIAGNOSIS — R609 Edema, unspecified: Secondary | ICD-10-CM | POA: Diagnosis not present

## 2013-02-03 DIAGNOSIS — I872 Venous insufficiency (chronic) (peripheral): Secondary | ICD-10-CM | POA: Diagnosis not present

## 2013-02-03 DIAGNOSIS — Z7901 Long term (current) use of anticoagulants: Secondary | ICD-10-CM | POA: Diagnosis not present

## 2013-02-03 DIAGNOSIS — G894 Chronic pain syndrome: Secondary | ICD-10-CM | POA: Diagnosis not present

## 2013-02-03 DIAGNOSIS — N182 Chronic kidney disease, stage 2 (mild): Secondary | ICD-10-CM | POA: Diagnosis not present

## 2013-02-03 DIAGNOSIS — I4891 Unspecified atrial fibrillation: Secondary | ICD-10-CM | POA: Diagnosis not present

## 2013-02-10 DIAGNOSIS — M5137 Other intervertebral disc degeneration, lumbosacral region: Secondary | ICD-10-CM | POA: Diagnosis not present

## 2013-02-10 DIAGNOSIS — G894 Chronic pain syndrome: Secondary | ICD-10-CM | POA: Diagnosis not present

## 2013-02-10 DIAGNOSIS — IMO0002 Reserved for concepts with insufficient information to code with codable children: Secondary | ICD-10-CM | POA: Diagnosis not present

## 2013-02-10 DIAGNOSIS — B0229 Other postherpetic nervous system involvement: Secondary | ICD-10-CM | POA: Diagnosis not present

## 2013-03-07 DIAGNOSIS — G894 Chronic pain syndrome: Secondary | ICD-10-CM | POA: Diagnosis not present

## 2013-03-07 DIAGNOSIS — I4891 Unspecified atrial fibrillation: Secondary | ICD-10-CM | POA: Diagnosis not present

## 2013-03-07 DIAGNOSIS — J309 Allergic rhinitis, unspecified: Secondary | ICD-10-CM | POA: Diagnosis not present

## 2013-03-07 DIAGNOSIS — Z7901 Long term (current) use of anticoagulants: Secondary | ICD-10-CM | POA: Diagnosis not present

## 2013-03-07 DIAGNOSIS — Z6826 Body mass index (BMI) 26.0-26.9, adult: Secondary | ICD-10-CM | POA: Diagnosis not present

## 2013-03-07 DIAGNOSIS — E039 Hypothyroidism, unspecified: Secondary | ICD-10-CM | POA: Diagnosis not present

## 2013-03-07 DIAGNOSIS — I10 Essential (primary) hypertension: Secondary | ICD-10-CM | POA: Diagnosis not present

## 2013-03-07 DIAGNOSIS — I872 Venous insufficiency (chronic) (peripheral): Secondary | ICD-10-CM | POA: Diagnosis not present

## 2013-03-10 DIAGNOSIS — IMO0001 Reserved for inherently not codable concepts without codable children: Secondary | ICD-10-CM | POA: Diagnosis not present

## 2013-03-10 DIAGNOSIS — B0229 Other postherpetic nervous system involvement: Secondary | ICD-10-CM | POA: Diagnosis not present

## 2013-03-10 DIAGNOSIS — G894 Chronic pain syndrome: Secondary | ICD-10-CM | POA: Diagnosis not present

## 2013-03-10 DIAGNOSIS — Z79899 Other long term (current) drug therapy: Secondary | ICD-10-CM | POA: Diagnosis not present

## 2013-03-21 DIAGNOSIS — Z7901 Long term (current) use of anticoagulants: Secondary | ICD-10-CM | POA: Diagnosis not present

## 2013-03-21 DIAGNOSIS — B070 Plantar wart: Secondary | ICD-10-CM | POA: Diagnosis not present

## 2013-03-21 DIAGNOSIS — I872 Venous insufficiency (chronic) (peripheral): Secondary | ICD-10-CM | POA: Diagnosis not present

## 2013-03-21 DIAGNOSIS — I4891 Unspecified atrial fibrillation: Secondary | ICD-10-CM | POA: Diagnosis not present

## 2013-04-11 DIAGNOSIS — I4891 Unspecified atrial fibrillation: Secondary | ICD-10-CM | POA: Diagnosis not present

## 2013-04-11 DIAGNOSIS — Z7901 Long term (current) use of anticoagulants: Secondary | ICD-10-CM | POA: Diagnosis not present

## 2013-04-11 DIAGNOSIS — B070 Plantar wart: Secondary | ICD-10-CM | POA: Diagnosis not present

## 2013-04-14 DIAGNOSIS — I83009 Varicose veins of unspecified lower extremity with ulcer of unspecified site: Secondary | ICD-10-CM | POA: Diagnosis not present

## 2013-04-14 DIAGNOSIS — L97909 Non-pressure chronic ulcer of unspecified part of unspecified lower leg with unspecified severity: Secondary | ICD-10-CM | POA: Diagnosis not present

## 2013-04-14 DIAGNOSIS — S91109A Unspecified open wound of unspecified toe(s) without damage to nail, initial encounter: Secondary | ICD-10-CM | POA: Diagnosis not present

## 2013-04-14 DIAGNOSIS — M79609 Pain in unspecified limb: Secondary | ICD-10-CM | POA: Diagnosis not present

## 2013-04-14 DIAGNOSIS — M2040 Other hammer toe(s) (acquired), unspecified foot: Secondary | ICD-10-CM | POA: Diagnosis not present

## 2013-04-14 DIAGNOSIS — L84 Corns and callosities: Secondary | ICD-10-CM | POA: Diagnosis not present

## 2013-04-16 DIAGNOSIS — I872 Venous insufficiency (chronic) (peripheral): Secondary | ICD-10-CM | POA: Diagnosis not present

## 2013-04-16 DIAGNOSIS — L97209 Non-pressure chronic ulcer of unspecified calf with unspecified severity: Secondary | ICD-10-CM | POA: Diagnosis not present

## 2013-04-18 DIAGNOSIS — L97209 Non-pressure chronic ulcer of unspecified calf with unspecified severity: Secondary | ICD-10-CM | POA: Diagnosis not present

## 2013-04-18 DIAGNOSIS — I872 Venous insufficiency (chronic) (peripheral): Secondary | ICD-10-CM | POA: Diagnosis not present

## 2013-04-21 DIAGNOSIS — I872 Venous insufficiency (chronic) (peripheral): Secondary | ICD-10-CM | POA: Diagnosis not present

## 2013-04-21 DIAGNOSIS — L97209 Non-pressure chronic ulcer of unspecified calf with unspecified severity: Secondary | ICD-10-CM | POA: Diagnosis not present

## 2013-04-23 DIAGNOSIS — I872 Venous insufficiency (chronic) (peripheral): Secondary | ICD-10-CM | POA: Diagnosis not present

## 2013-04-23 DIAGNOSIS — L97209 Non-pressure chronic ulcer of unspecified calf with unspecified severity: Secondary | ICD-10-CM | POA: Diagnosis not present

## 2013-04-25 DIAGNOSIS — L97209 Non-pressure chronic ulcer of unspecified calf with unspecified severity: Secondary | ICD-10-CM | POA: Diagnosis not present

## 2013-04-25 DIAGNOSIS — I872 Venous insufficiency (chronic) (peripheral): Secondary | ICD-10-CM | POA: Diagnosis not present

## 2013-04-28 DIAGNOSIS — L97209 Non-pressure chronic ulcer of unspecified calf with unspecified severity: Secondary | ICD-10-CM | POA: Diagnosis not present

## 2013-04-28 DIAGNOSIS — M79609 Pain in unspecified limb: Secondary | ICD-10-CM | POA: Diagnosis not present

## 2013-04-28 DIAGNOSIS — M2040 Other hammer toe(s) (acquired), unspecified foot: Secondary | ICD-10-CM | POA: Diagnosis not present

## 2013-04-28 DIAGNOSIS — M2060 Acquired deformities of toe(s), unspecified, unspecified foot: Secondary | ICD-10-CM | POA: Diagnosis not present

## 2013-04-28 DIAGNOSIS — L84 Corns and callosities: Secondary | ICD-10-CM | POA: Diagnosis not present

## 2013-04-28 DIAGNOSIS — I872 Venous insufficiency (chronic) (peripheral): Secondary | ICD-10-CM | POA: Diagnosis not present

## 2013-04-30 DIAGNOSIS — I872 Venous insufficiency (chronic) (peripheral): Secondary | ICD-10-CM | POA: Diagnosis not present

## 2013-04-30 DIAGNOSIS — L97209 Non-pressure chronic ulcer of unspecified calf with unspecified severity: Secondary | ICD-10-CM | POA: Diagnosis not present

## 2013-05-02 DIAGNOSIS — Z981 Arthrodesis status: Secondary | ICD-10-CM | POA: Diagnosis not present

## 2013-05-02 DIAGNOSIS — M25569 Pain in unspecified knee: Secondary | ICD-10-CM | POA: Diagnosis not present

## 2013-05-02 DIAGNOSIS — M949 Disorder of cartilage, unspecified: Secondary | ICD-10-CM | POA: Diagnosis not present

## 2013-05-02 DIAGNOSIS — M899 Disorder of bone, unspecified: Secondary | ICD-10-CM | POA: Diagnosis not present

## 2013-05-05 DIAGNOSIS — M5137 Other intervertebral disc degeneration, lumbosacral region: Secondary | ICD-10-CM | POA: Diagnosis not present

## 2013-05-05 DIAGNOSIS — G894 Chronic pain syndrome: Secondary | ICD-10-CM | POA: Diagnosis not present

## 2013-05-05 DIAGNOSIS — IMO0001 Reserved for inherently not codable concepts without codable children: Secondary | ICD-10-CM | POA: Diagnosis not present

## 2013-05-05 DIAGNOSIS — IMO0002 Reserved for concepts with insufficient information to code with codable children: Secondary | ICD-10-CM | POA: Diagnosis not present

## 2013-05-06 DIAGNOSIS — L97209 Non-pressure chronic ulcer of unspecified calf with unspecified severity: Secondary | ICD-10-CM | POA: Diagnosis not present

## 2013-05-06 DIAGNOSIS — I872 Venous insufficiency (chronic) (peripheral): Secondary | ICD-10-CM | POA: Diagnosis not present

## 2013-05-12 DIAGNOSIS — M259 Joint disorder, unspecified: Secondary | ICD-10-CM | POA: Diagnosis not present

## 2013-05-12 DIAGNOSIS — M19019 Primary osteoarthritis, unspecified shoulder: Secondary | ICD-10-CM | POA: Diagnosis not present

## 2013-05-13 DIAGNOSIS — Z7901 Long term (current) use of anticoagulants: Secondary | ICD-10-CM | POA: Diagnosis not present

## 2013-05-13 DIAGNOSIS — I4891 Unspecified atrial fibrillation: Secondary | ICD-10-CM | POA: Diagnosis not present

## 2013-05-21 DIAGNOSIS — Z5189 Encounter for other specified aftercare: Secondary | ICD-10-CM | POA: Diagnosis not present

## 2013-05-21 DIAGNOSIS — M25569 Pain in unspecified knee: Secondary | ICD-10-CM | POA: Diagnosis not present

## 2013-06-17 DIAGNOSIS — J45909 Unspecified asthma, uncomplicated: Secondary | ICD-10-CM | POA: Diagnosis not present

## 2013-06-17 DIAGNOSIS — I10 Essential (primary) hypertension: Secondary | ICD-10-CM | POA: Diagnosis not present

## 2013-06-17 DIAGNOSIS — R350 Frequency of micturition: Secondary | ICD-10-CM | POA: Diagnosis not present

## 2013-06-17 DIAGNOSIS — D1739 Benign lipomatous neoplasm of skin and subcutaneous tissue of other sites: Secondary | ICD-10-CM | POA: Diagnosis not present

## 2013-06-17 DIAGNOSIS — Z7901 Long term (current) use of anticoagulants: Secondary | ICD-10-CM | POA: Diagnosis not present

## 2013-06-17 DIAGNOSIS — R809 Proteinuria, unspecified: Secondary | ICD-10-CM | POA: Diagnosis not present

## 2013-06-17 DIAGNOSIS — I509 Heart failure, unspecified: Secondary | ICD-10-CM | POA: Diagnosis not present

## 2013-06-17 DIAGNOSIS — N390 Urinary tract infection, site not specified: Secondary | ICD-10-CM | POA: Diagnosis not present

## 2013-06-17 DIAGNOSIS — I4891 Unspecified atrial fibrillation: Secondary | ICD-10-CM | POA: Diagnosis not present

## 2013-06-30 DIAGNOSIS — B0229 Other postherpetic nervous system involvement: Secondary | ICD-10-CM | POA: Diagnosis not present

## 2013-06-30 DIAGNOSIS — G894 Chronic pain syndrome: Secondary | ICD-10-CM | POA: Diagnosis not present

## 2013-06-30 DIAGNOSIS — M47817 Spondylosis without myelopathy or radiculopathy, lumbosacral region: Secondary | ICD-10-CM | POA: Diagnosis not present

## 2013-06-30 DIAGNOSIS — M5137 Other intervertebral disc degeneration, lumbosacral region: Secondary | ICD-10-CM | POA: Diagnosis not present

## 2013-07-08 DIAGNOSIS — J45909 Unspecified asthma, uncomplicated: Secondary | ICD-10-CM | POA: Diagnosis not present

## 2013-07-08 DIAGNOSIS — D1739 Benign lipomatous neoplasm of skin and subcutaneous tissue of other sites: Secondary | ICD-10-CM | POA: Diagnosis not present

## 2013-07-08 DIAGNOSIS — I10 Essential (primary) hypertension: Secondary | ICD-10-CM | POA: Diagnosis not present

## 2013-07-08 DIAGNOSIS — Z6828 Body mass index (BMI) 28.0-28.9, adult: Secondary | ICD-10-CM | POA: Diagnosis not present

## 2013-07-08 DIAGNOSIS — I509 Heart failure, unspecified: Secondary | ICD-10-CM | POA: Diagnosis not present

## 2013-07-08 DIAGNOSIS — I4891 Unspecified atrial fibrillation: Secondary | ICD-10-CM | POA: Diagnosis not present

## 2013-07-08 DIAGNOSIS — Z7901 Long term (current) use of anticoagulants: Secondary | ICD-10-CM | POA: Diagnosis not present

## 2013-07-14 DIAGNOSIS — I4891 Unspecified atrial fibrillation: Secondary | ICD-10-CM | POA: Diagnosis not present

## 2013-07-14 DIAGNOSIS — I119 Hypertensive heart disease without heart failure: Secondary | ICD-10-CM | POA: Diagnosis not present

## 2013-07-14 DIAGNOSIS — E785 Hyperlipidemia, unspecified: Secondary | ICD-10-CM | POA: Diagnosis not present

## 2013-07-14 DIAGNOSIS — I059 Rheumatic mitral valve disease, unspecified: Secondary | ICD-10-CM | POA: Diagnosis not present

## 2013-07-14 DIAGNOSIS — E669 Obesity, unspecified: Secondary | ICD-10-CM | POA: Diagnosis not present

## 2013-07-14 DIAGNOSIS — R0609 Other forms of dyspnea: Secondary | ICD-10-CM | POA: Diagnosis not present

## 2013-07-14 DIAGNOSIS — Z7901 Long term (current) use of anticoagulants: Secondary | ICD-10-CM | POA: Diagnosis not present

## 2013-07-24 ENCOUNTER — Encounter (HOSPITAL_COMMUNITY): Payer: Self-pay | Admitting: Emergency Medicine

## 2013-07-24 ENCOUNTER — Emergency Department (HOSPITAL_COMMUNITY)
Admission: EM | Admit: 2013-07-24 | Discharge: 2013-07-24 | Disposition: A | Discharge status: Home / Self Care | Payer: Medicare Other | Attending: Emergency Medicine | Admitting: Emergency Medicine

## 2013-07-24 DIAGNOSIS — E039 Hypothyroidism, unspecified: Secondary | ICD-10-CM | POA: Diagnosis not present

## 2013-07-24 DIAGNOSIS — Z79899 Other long term (current) drug therapy: Secondary | ICD-10-CM | POA: Diagnosis not present

## 2013-07-24 DIAGNOSIS — Z7901 Long term (current) use of anticoagulants: Secondary | ICD-10-CM | POA: Insufficient documentation

## 2013-07-24 DIAGNOSIS — G2581 Restless legs syndrome: Secondary | ICD-10-CM | POA: Diagnosis not present

## 2013-07-24 DIAGNOSIS — L02419 Cutaneous abscess of limb, unspecified: Secondary | ICD-10-CM | POA: Insufficient documentation

## 2013-07-24 DIAGNOSIS — Z87891 Personal history of nicotine dependence: Secondary | ICD-10-CM | POA: Insufficient documentation

## 2013-07-24 DIAGNOSIS — Z7982 Long term (current) use of aspirin: Secondary | ICD-10-CM | POA: Diagnosis not present

## 2013-07-24 DIAGNOSIS — F410 Panic disorder [episodic paroxysmal anxiety] without agoraphobia: Secondary | ICD-10-CM | POA: Insufficient documentation

## 2013-07-24 DIAGNOSIS — L03119 Cellulitis of unspecified part of limb: Secondary | ICD-10-CM | POA: Diagnosis not present

## 2013-07-24 DIAGNOSIS — M791 Myalgia, unspecified site: Secondary | ICD-10-CM

## 2013-07-24 DIAGNOSIS — I10 Essential (primary) hypertension: Secondary | ICD-10-CM | POA: Insufficient documentation

## 2013-07-24 DIAGNOSIS — IMO0001 Reserved for inherently not codable concepts without codable children: Secondary | ICD-10-CM | POA: Diagnosis not present

## 2013-07-24 LAB — COMPREHENSIVE METABOLIC PANEL
ALBUMIN: 3.5 g/dL (ref 3.5–5.2)
ALT: 9 U/L (ref 0–35)
AST: 19 U/L (ref 0–37)
Alkaline Phosphatase: 150 U/L — ABNORMAL HIGH (ref 39–117)
BILIRUBIN TOTAL: 0.7 mg/dL (ref 0.3–1.2)
BUN: 13 mg/dL (ref 6–23)
CALCIUM: 10.2 mg/dL (ref 8.4–10.5)
CHLORIDE: 97 meq/L (ref 96–112)
CO2: 28 meq/L (ref 19–32)
Creatinine, Ser: 0.81 mg/dL (ref 0.50–1.10)
GFR calc Af Amer: 75 mL/min — ABNORMAL LOW (ref >90)
GFR calc non Af Amer: 65 mL/min — ABNORMAL LOW (ref >90)
Glucose, Bld: 100 mg/dL — ABNORMAL HIGH (ref 70–99)
Potassium: 3.4 mEq/L — ABNORMAL LOW (ref 3.7–5.3)
Sodium: 141 mEq/L (ref 137–147)
Total Protein: 6.9 g/dL (ref 6.0–8.3)

## 2013-07-24 LAB — CBC WITH DIFFERENTIAL/PLATELET
BASOS ABS: 0 10*3/uL (ref 0.0–0.1)
Basophils Relative: 0 % (ref 0–1)
Eosinophils Absolute: 0.2 10*3/uL (ref 0.0–0.7)
Eosinophils Relative: 3 % (ref 0–5)
HCT: 36.6 % (ref 36.0–46.0)
Hemoglobin: 12.1 g/dL (ref 12.0–15.0)
LYMPHS PCT: 32 % (ref 12–46)
Lymphs Abs: 1.8 10*3/uL (ref 0.7–4.0)
MCH: 28.9 pg (ref 26.0–34.0)
MCHC: 33.1 g/dL (ref 30.0–36.0)
MCV: 87.4 fL (ref 78.0–100.0)
Monocytes Absolute: 0.6 10*3/uL (ref 0.1–1.0)
Monocytes Relative: 11 % (ref 3–12)
Neutro Abs: 3.1 10*3/uL (ref 1.7–7.7)
Neutrophils Relative %: 54 % (ref 43–77)
PLATELETS: 205 10*3/uL (ref 150–400)
RBC: 4.19 MIL/uL (ref 3.87–5.11)
RDW: 14.2 % (ref 11.5–15.5)
WBC: 5.7 10*3/uL (ref 4.0–10.5)

## 2013-07-24 LAB — URINE MICROSCOPIC-ADD ON

## 2013-07-24 LAB — URINALYSIS, ROUTINE W REFLEX MICROSCOPIC
BILIRUBIN URINE: NEGATIVE
GLUCOSE, UA: NEGATIVE mg/dL
Ketones, ur: NEGATIVE mg/dL
Leukocytes, UA: NEGATIVE
Nitrite: NEGATIVE
Protein, ur: NEGATIVE mg/dL
SPECIFIC GRAVITY, URINE: 1.009 (ref 1.005–1.030)
Urobilinogen, UA: 0.2 mg/dL (ref 0.0–1.0)
pH: 8 (ref 5.0–8.0)

## 2013-07-24 MED ORDER — TRAMADOL HCL 50 MG PO TABS: 50.0000 mg | ORAL_TABLET | Freq: Four times a day (QID) | ORAL | Status: DC | PRN

## 2013-07-24 MED ORDER — CEPHALEXIN 500 MG PO TABS: 500.0000 mg | ORAL_TABLET | Freq: Four times a day (QID) | ORAL | Status: DC

## 2013-07-24 MED ORDER — LORAZEPAM 1 MG PO TABS: 1.0000 mg | ORAL_TABLET | Freq: Three times a day (TID) | ORAL | Status: DC | PRN

## 2013-07-24 NOTE — ED Notes (Signed)
Patient here with generalized body pain and restlessness in legs. Endorses no fevers, no new medications, no GI symptoms reported.

## 2013-07-24 NOTE — ED Provider Notes (Signed)
CSN: [id number]     Arrival date & time 07/24/13  0019 History   First MD Initiated Contact with Patient 07/24/13 0410     Chief Complaint  Patient presents with  . Generalized Body Aches     (Consider location/radiation/quality/duration/timing/severity/associated sxs/prior Treatment) The history is provided by the patient.   80 year old female complains of aching in her legs and restless legs for the last 2-3 days. She states she's had chills and sweats but does not know she's had a fever. She's been unable to sleep because of the aching in the restlessness. Denies cough or dyspnea. There's been no vomiting or diarrhea. She denies dysuria. She has chronic leg swelling and has noted a rash on her lower legs. She's not taken any medication for her symptoms.  Past Medical History  Diagnosis Date  . Hypertension   . Thyroid disease   . Coronary artery disease   . Panic attacks   . Vitamin D deficiency   . Hypothyroidism   . RLS (restless legs syndrome)    Past Surgical History  Procedure Laterality Date  . Joint replacement     History reviewed. No pertinent family history. History  Substance Use Topics  . Smoking status: Former Smoker    Quit date: 02/13/1966  . Smokeless tobacco: Not on file  . Alcohol Use: No   OB History   Grav Para Term Preterm Abortions TAB SAB Ect Mult Living                 Review of Systems  All other systems reviewed and are negative.     Allergies  Codeine  Home Medications   Prior to Admission medications   Medication Sig Start Date End Date Taking? Authorizing Provider  ALPRAZolam Duanne Moron) 0.5 MG tablet Take 0.5-1 mg by mouth 2 (two) times daily. Takes 0.5mg  in the morning and 1mg  at bedtime    Historical Provider, MD  aspirin EC 81 MG tablet Take 81 mg by mouth daily.    Historical Provider, MD  diltiazem (CARDIZEM CD) 240 MG 24 hr capsule Take 240 mg by mouth daily.    Historical Provider, MD  DULoxetine (CYMBALTA) 30 MG capsule Take  90 mg by mouth daily.    Historical Provider, MD  fentaNYL (DURAGESIC - DOSED MCG/HR) 50 MCG/HR Place 50 mcg onto the skin every 3 (three) days.    Historical Provider, MD  furosemide (LASIX) 80 MG tablet Take 80 mg by mouth daily.    Historical Provider, MD  HYDROcodone-acetaminophen (NORCO) 10-325 MG per tablet Take 0.5-1 tablets by mouth every 6 (six) hours as needed.    Historical Provider, MD  levothyroxine (SYNTHROID, LEVOTHROID) 137 MCG tablet Take 137 mcg by mouth daily before breakfast.    Historical Provider, MD  nitrofurantoin, macrocrystal-monohydrate, (MACROBID) 100 MG capsule Take 1 capsule (100 mg total) by mouth 2 (two) times daily. 01/28/13   Tanna Furry, MD  potassium chloride (K-DUR) 10 MEQ tablet Take a dose of one tablet every time you take a  Lasix dose. 01/28/13   Tanna Furry, MD  saccharomyces boulardii (FLORASTOR) 250 MG capsule Take 250 mg by mouth 2 (two) times daily.    Historical Provider, MD  tamsulosin (FLOMAX) 0.4 MG CAPS capsule Take 0.4 mg by mouth daily.    Historical Provider, MD  warfarin (COUMADIN) 2.5 MG tablet Take 2.5-3.75 mg by mouth daily. Takes 3.75mg  on Monday, Wednesday, and Friday. Takes 2.5mg  all other days    Historical Provider, MD  BP 117/89  Pulse 90  Temp(Src) 98.3 F (36.8 C) (Oral)  Resp 18  Ht 5\' 5"  (1.651 m)  Wt 160 lb (72.576 kg)  BMI 26.63 kg/m2  SpO2 100% Physical Exam  Nursing note and vitals reviewed.  80 year old female, resting comfortably and in no acute distress. Vital signs are normal. Oxygen saturation is 100%, which is normal. Head is normocephalic and atraumatic. PERRLA, EOMI. Oropharynx is clear. Neck is nontender and supple without adenopathy or JVD. Back is nontender and there is no CVA tenderness. Lungs are clear without rales, wheezes, or rhonchi. Chest is nontender. Heart has regular rate and rhythm without murmur. Abdomen is soft, flat, nontender without masses or hepatosplenomegaly and peristalsis is  normoactive. Extremities have 2+ edema. Venous stasis changes are present bilaterally. There is moderate erythema of the right lower leg with warmth consistent with cellulitis. No lymphangitic streaks are seen.. Skin is warm and dry without other rash. Neurologic: Mental status is normal, cranial nerves are intact, there are no motor or sensory deficits.  ED Course  Procedures (including critical care time) Labs Review Results for orders placed during the hospital encounter of 07/24/13  CBC WITH DIFFERENTIAL      Result Value Ref Range   WBC 5.7  4.0 - 10.5 K/uL   RBC 4.19  3.87 - 5.11 MIL/uL   Hemoglobin 12.1  12.0 - 15.0 g/dL   HCT 36.6  36.0 - 46.0 %   MCV 87.4  78.0 - 100.0 fL   MCH 28.9  26.0 - 34.0 pg   MCHC 33.1  30.0 - 36.0 g/dL   RDW 14.2  11.5 - 15.5 %   Platelets 205  150 - 400 K/uL   Neutrophils Relative % 54  43 - 77 %   Neutro Abs 3.1  1.7 - 7.7 K/uL   Lymphocytes Relative 32  12 - 46 %   Lymphs Abs 1.8  0.7 - 4.0 K/uL   Monocytes Relative 11  3 - 12 %   Monocytes Absolute 0.6  0.1 - 1.0 K/uL   Eosinophils Relative 3  0 - 5 %   Eosinophils Absolute 0.2  0.0 - 0.7 K/uL   Basophils Relative 0  0 - 1 %   Basophils Absolute 0.0  0.0 - 0.1 K/uL  COMPREHENSIVE METABOLIC PANEL      Result Value Ref Range   Sodium 141  137 - 147 mEq/L   Potassium 3.4 (*) 3.7 - 5.3 mEq/L   Chloride 97  96 - 112 mEq/L   CO2 28  19 - 32 mEq/L   Glucose, Bld 100 (*) 70 - 99 mg/dL   BUN 13  6 - 23 mg/dL   Creatinine, Ser 0.81  0.50 - 1.10 mg/dL   Calcium 10.2  8.4 - 10.5 mg/dL   Total Protein 6.9  6.0 - 8.3 g/dL   Albumin 3.5  3.5 - 5.2 g/dL   AST 19  0 - 37 U/L   ALT 9  0 - 35 U/L   Alkaline Phosphatase 150 (*) 39 - 117 U/L   Total Bilirubin 0.7  0.3 - 1.2 mg/dL   GFR calc non Af Amer 65 (*) >90 mL/min   GFR calc Af Amer 75 (*) >90 mL/min  URINALYSIS, ROUTINE W REFLEX MICROSCOPIC      Result Value Ref Range   Color, Urine YELLOW  YELLOW   APPearance CLEAR  CLEAR   Specific  Gravity, Urine 1.009  1.005 - 1.030  pH 8.0  5.0 - 8.0   Glucose, UA NEGATIVE  NEGATIVE mg/dL   Hgb urine dipstick TRACE (*) NEGATIVE   Bilirubin Urine NEGATIVE  NEGATIVE   Ketones, ur NEGATIVE  NEGATIVE mg/dL   Protein, ur NEGATIVE  NEGATIVE mg/dL   Urobilinogen, UA 0.2  0.0 - 1.0 mg/dL   Nitrite NEGATIVE  NEGATIVE   Leukocytes, UA NEGATIVE  NEGATIVE  URINE MICROSCOPIC-ADD ON      Result Value Ref Range   Squamous Epithelial / LPF RARE  RARE   RBC / HPF 0-2  <3 RBC/hpf   Bacteria, UA RARE  RARE     MDM   Final diagnoses:  Cellulitis, leg  Myalgia  Restless leg    Leg pain with him clinical findings suggestive of cellulitis. Screening labs have been sent and urinalysis will be checked to rule out UTI.  Laboratory workup is unremarkable and urinalysis is negative. She is discharged with prescription for cephalexin. She's given tramadol for pain and lorazepam for anxiety and jitteriness. She is to followup with PCP in 4 days, but returned to the ED sooner if symptoms are worsening.  Delora Fuel, MD 81/27/51 [id number]

## 2013-07-24 NOTE — ED Notes (Signed)
Pt requesting something for her nerves.

## 2013-07-24 NOTE — Discharge Instructions (Signed)
Return if symptoms are getting worse.  Cellulitis Cellulitis is an infection of the skin and the tissue beneath it. The infected area is usually red and tender. Cellulitis occurs most often in the arms and lower legs.  CAUSES  Cellulitis is caused by bacteria that enter the skin through cracks or cuts in the skin. The most common types of bacteria that cause cellulitis are Staphylococcus and Streptococcus. SYMPTOMS   Redness and warmth.  Swelling.  Tenderness or pain.  Fever. DIAGNOSIS  Your caregiver can usually determine what is wrong based on a physical exam. Blood tests may also be done. TREATMENT  Treatment usually involves taking an antibiotic medicine. HOME CARE INSTRUCTIONS   Take your antibiotics as directed. Finish them even if you start to feel better.  Keep the infected arm or leg elevated to reduce swelling.  Apply a warm cloth to the affected area up to 4 times per day to relieve pain.  Only take over-the-counter or prescription medicines for pain, discomfort, or fever as directed by your caregiver.  Keep all follow-up appointments as directed by your caregiver. SEEK MEDICAL CARE IF:   You notice red streaks coming from the infected area.  Your red area gets larger or turns dark in color.  Your bone or joint underneath the infected area becomes painful after the skin has healed.  Your infection returns in the same area or another area.  You notice a swollen bump in the infected area.  You develop new symptoms. SEEK IMMEDIATE MEDICAL CARE IF:   You have a fever.  You feel very sleepy.  You develop vomiting or diarrhea.  You have a general ill feeling (malaise) with muscle aches and pains. MAKE SURE YOU:   Understand these instructions.  Will watch your condition.  Will get help right away if you are not doing well or get worse. Document Released: 11/09/2004 Document Revised: 08/01/2011 Document Reviewed: 04/17/2011 [facility] Patient Information  2014 [place].  Cephalexin tablets or capsules What is this medicine? CEPHALEXIN (sef a LEX in) is a cephalosporin antibiotic. It is used to treat certain kinds of bacterial infections It will not work for colds, flu, or other viral infections. This medicine may be used for other purposes; ask your health care provider or pharmacist if you have questions. COMMON BRAND NAME(S): Biocef, Keflex, Keftab What should I tell my health care provider before I take this medicine? They need to know if you have any of these conditions: -kidney disease -stomach or intestine problems, especially colitis -an unusual or allergic reaction to cephalexin, other cephalosporins, penicillins, other antibiotics, medicines, foods, dyes or preservatives -pregnant or trying to get pregnant -breast-feeding How should I use this medicine? Take this medicine by mouth with a full glass of water. Follow the directions on the prescription label. This medicine can be taken with or without food. Take your medicine at regular intervals. Do not take your medicine more often than directed. Take all of your medicine as directed even if you think you are better. Do not skip doses or stop your medicine early. Talk to your pediatrician regarding the use of this medicine in children. While this drug may be prescribed for selected conditions, precautions do apply. Overdosage: If you think you have taken too much of this medicine contact a poison control center or emergency room at once. NOTE: This medicine is only for you. Do not share this medicine with others. What if I miss a dose? If you miss a dose, take it  as soon as you can. If it is almost time for your next dose, take only that dose. Do not take double or extra doses. There should be at least 4 to 6 hours between doses. What may interact with this medicine? -probenecid -some other antibiotics This list may not describe all possible interactions. Give your health care  provider a list of all the medicines, herbs, non-prescription drugs, or dietary supplements you use. Also tell them if you smoke, drink alcohol, or use illegal drugs. Some items may interact with your medicine. What should I watch for while using this medicine? Tell your doctor or health care professional if your symptoms do not begin to improve in a few days. Do not treat diarrhea with over the counter products. Contact your doctor if you have diarrhea that lasts more than 2 days or if it is severe and watery. If you have diabetes, you may get a false-positive result for sugar in your urine. Check with your doctor or health care professional. What side effects may I notice from receiving this medicine? Side effects that you should report to your doctor or health care professional as soon as possible: -allergic reactions like skin rash, itching or hives, swelling of the face, lips, or tongue -breathing problems -pain or trouble passing urine -redness, blistering, peeling or loosening of the skin, including inside the mouth -severe or watery diarrhea -unusually weak or tired -yellowing of the eyes, skin Side effects that usually do not require medical attention (report to your doctor or health care professional if they continue or are bothersome): -gas or heartburn -genital or anal irritation -headache -joint or muscle pain -nausea, vomiting This list may not describe all possible side effects. Call your doctor for medical advice about side effects. You may report side effects to FDA at 1-800-FDA-1088. Where should I keep my medicine? Keep out of the reach of children. Store at room temperature between 59 and 86 degrees F (15 and 30 degrees C). Throw away any unused medicine after the expiration date. NOTE: This sheet is a summary. It may not cover all possible information. If you have questions about this medicine, talk to your doctor, pharmacist, or health care provider.  2014,  Elsevier/Gold Standard. (2007-05-06 17:09:13)  Tramadol tablets What is this medicine? TRAMADOL (TRA ma dole) is a pain reliever. It is used to treat moderate to severe pain in adults. This medicine may be used for other purposes; ask your health care provider or pharmacist if you have questions. COMMON BRAND NAME(S): Ultram What should I tell my health care provider before I take this medicine? They need to know if you have any of these conditions: -brain tumor -depression -drug abuse or addiction -head injury -if you frequently drink alcohol containing drinks -kidney disease or trouble passing urine -liver disease -lung disease, asthma, or breathing problems -seizures or epilepsy -suicidal thoughts, plans, or attempt; a previous suicide attempt by you or a family member -an unusual or allergic reaction to tramadol, codeine, other medicines, foods, dyes, or preservatives -pregnant or trying to get pregnant -breast-feeding How should I use this medicine? Take this medicine by mouth with a full glass of water. Follow the directions on the prescription label. If the medicine upsets your stomach, take it with food or milk. Do not take more medicine than you are told to take. Talk to your pediatrician regarding the use of this medicine in children. Special care may be needed. Overdosage: If you think you have taken too much of  this medicine contact a poison control center or emergency room at once. NOTE: This medicine is only for you. Do not share this medicine with others. What if I miss a dose? If you miss a dose, take it as soon as you can. If it is almost time for your next dose, take only that dose. Do not take double or extra doses. What may interact with this medicine? Do not take this medicine with any of the following medications: -MAOIs like Carbex, Eldepryl, Marplan, Nardil, and Parnate This medicine may also interact with the following medications: -alcohol or medicines that  contain alcohol -antihistamines -benzodiazepines -bupropion -carbamazepine or oxcarbazepine -clozapine -cyclobenzaprine -digoxin -furazolidone -linezolid -medicines for depression, anxiety, or psychotic disturbances -medicines for migraine headache like almotriptan, eletriptan, frovatriptan, naratriptan, rizatriptan, sumatriptan, zolmitriptan -medicines for pain like pentazocine, buprenorphine, butorphanol, meperidine, nalbuphine, and propoxyphene -medicines for sleep -muscle relaxants -naltrexone -phenobarbital -phenothiazines like perphenazine, thioridazine, chlorpromazine, mesoridazine, fluphenazine, prochlorperazine, promazine, and trifluoperazine -procarbazine -warfarin This list may not describe all possible interactions. Give your health care provider a list of all the medicines, herbs, non-prescription drugs, or dietary supplements you use. Also tell them if you smoke, drink alcohol, or use illegal drugs. Some items may interact with your medicine. What should I watch for while using this medicine? Tell your doctor or health care professional if your pain does not go away, if it gets worse, or if you have new or a different type of pain. You may develop tolerance to the medicine. Tolerance means that you will need a higher dose of the medicine for pain relief. Tolerance is normal and is expected if you take this medicine for a long time. Do not suddenly stop taking your medicine because you may develop a severe reaction. Your body becomes used to the medicine. This does NOT mean you are addicted. Addiction is a behavior related to getting and using a drug for a non-medical reason. If you have pain, you have a medical reason to take pain medicine. Your doctor will tell you how much medicine to take. If your doctor wants you to stop the medicine, the dose will be slowly lowered over time to avoid any side effects. You may get drowsy or dizzy. Do not drive, use machinery, or do anything  that needs mental alertness until you know how this medicine affects you. Do not stand or sit up quickly, especially if you are an older patient. This reduces the risk of dizzy or fainting spells. Alcohol can increase or decrease the effects of this medicine. Avoid alcoholic drinks. You may have constipation. Try to have a bowel movement at least every 2 to 3 days. If you do not have a bowel movement for 3 days, call your doctor or health care professional. Your mouth may get dry. Chewing sugarless gum or sucking hard candy, and drinking plenty of water may help. Contact your doctor if the problem does not go away or is severe. What side effects may I notice from receiving this medicine? Side effects that you should report to your doctor or health care professional as soon as possible: -allergic reactions like skin rash, itching or hives, swelling of the face, lips, or tongue -breathing difficulties, wheezing -confusion -itching -light headedness or fainting spells -redness, blistering, peeling or loosening of the skin, including inside the mouth -seizures Side effects that usually do not require medical attention (report to your doctor or health care professional if they continue or are bothersome): -constipation -dizziness -drowsiness -headache -nausea, vomiting This  list may not describe all possible side effects. Call your doctor for medical advice about side effects. You may report side effects to FDA at 1-800-FDA-1088. Where should I keep my medicine? Keep out of the reach of children. Store at room temperature between 15 and 30 degrees C (59 and 86 degrees F). Keep container tightly closed. Throw away any unused medicine after the expiration date. NOTE: This sheet is a summary. It may not cover all possible information. If you have questions about this medicine, talk to your doctor, pharmacist, or health care provider.  2014, Elsevier/Gold Standard. (2009-10-13 11:55:44)  Lorazepam  tablets What is this medicine? LORAZEPAM (lor A ze pam) is a benzodiazepine. It is used to treat anxiety. This medicine may be used for other purposes; ask your health care provider or pharmacist if you have questions. COMMON BRAND NAME(S): Ativan What should I tell my health care provider before I take this medicine? They need to know if you have any of these conditions: -alcohol or drug abuse problem -bipolar disorder, depression, psychosis or other mental health condition -glaucoma -kidney or liver disease -lung disease or breathing difficulties -myasthenia gravis -Parkinson's disease -seizures or a history of seizures -suicidal thoughts -an unusual or allergic reaction to lorazepam, other benzodiazepines, foods, dyes, or preservatives -pregnant or trying to get pregnant -breast-feeding How should I use this medicine? Take this medicine by mouth with a glass of water. Follow the directions on the prescription label. If it upsets your stomach, take it with food or milk. Take your medicine at regular intervals. Do not take it more often than directed. Do not stop taking except on the advice of your doctor or health care professional. Talk to your pediatrician regarding the use of this medicine in children. Special care may be needed. Overdosage: If you think you have taken too much of this medicine contact a poison control center or emergency room at once. NOTE: This medicine is only for you. Do not share this medicine with others. What if I miss a dose? If you miss a dose, take it as soon as you can. If it is almost time for your next dose, take only that dose. Do not take double or extra doses. What may interact with this medicine? -barbiturate medicines for inducing sleep or treating seizures, like phenobarbital -clozapine -medicines for depression, mental problems or psychiatric disturbances -medicines for sleep -phenytoin -probenecid -theophylline -valproic acid This list may  not describe all possible interactions. Give your health care provider a list of all the medicines, herbs, non-prescription drugs, or dietary supplements you use. Also tell them if you smoke, drink alcohol, or use illegal drugs. Some items may interact with your medicine. What should I watch for while using this medicine? Visit your doctor or health care professional for regular checks on your progress. Your body may become dependent on this medicine, ask your doctor or health care professional if you still need to take it. However, if you have been taking this medicine regularly for some time, do not suddenly stop taking it. You must gradually reduce the dose or you may get severe side effects. Ask your doctor or health care professional for advice before increasing or decreasing the dose. Even after you stop taking this medicine it can still affect your body for several days. You may get drowsy or dizzy. Do not drive, use machinery, or do anything that needs mental alertness until you know how this medicine affects you. To reduce the risk of dizzy and  fainting spells, do not stand or sit up quickly, especially if you are an older patient. Alcohol may increase dizziness and drowsiness. Avoid alcoholic drinks. Do not treat yourself for coughs, colds or allergies without asking your doctor or health care professional for advice. Some ingredients can increase possible side effects. What side effects may I notice from receiving this medicine? Side effects that you should report to your doctor or health care professional as soon as possible: -changes in vision -confusion -depression -mood changes, excitability or aggressive behavior -movement difficulty, staggering or jerky movements -muscle cramps -restlessness -weakness or tiredness Side effects that usually do not require medical attention (report to your doctor or health care professional if they continue or are bothersome): -constipation or  diarrhea -difficulty sleeping, nightmares -dizziness, drowsiness -headache -nausea, vomiting This list may not describe all possible side effects. Call your doctor for medical advice about side effects. You may report side effects to FDA at 1-800-FDA-1088. Where should I keep my medicine? Keep out of the reach of children. This medicine can be abused. Keep your medicine in a safe place to protect it from theft. Do not share this medicine with anyone. Selling or giving away this medicine is dangerous and against the law. Store at room temperature between 20 and 25 degrees C (68 and 77 degrees F). Protect from light. Keep container tightly closed. Throw away any unused medicine after the expiration date. NOTE: This sheet is a summary. It may not cover all possible information. If you have questions about this medicine, talk to your doctor, pharmacist, or health care provider.  2014, Elsevier/Gold Standard. (2007-08-02 14:58:20)

## 2013-07-29 DIAGNOSIS — I872 Venous insufficiency (chronic) (peripheral): Secondary | ICD-10-CM | POA: Diagnosis not present

## 2013-07-29 DIAGNOSIS — L02419 Cutaneous abscess of limb, unspecified: Secondary | ICD-10-CM | POA: Diagnosis not present

## 2013-07-29 DIAGNOSIS — I10 Essential (primary) hypertension: Secondary | ICD-10-CM | POA: Diagnosis not present

## 2013-07-29 DIAGNOSIS — L03119 Cellulitis of unspecified part of limb: Secondary | ICD-10-CM | POA: Diagnosis not present

## 2013-07-29 DIAGNOSIS — Z7901 Long term (current) use of anticoagulants: Secondary | ICD-10-CM | POA: Diagnosis not present

## 2013-07-29 DIAGNOSIS — I4891 Unspecified atrial fibrillation: Secondary | ICD-10-CM | POA: Diagnosis not present

## 2013-07-31 DIAGNOSIS — I4891 Unspecified atrial fibrillation: Secondary | ICD-10-CM | POA: Diagnosis not present

## 2013-07-31 DIAGNOSIS — Z7901 Long term (current) use of anticoagulants: Secondary | ICD-10-CM | POA: Diagnosis not present

## 2013-07-31 DIAGNOSIS — L02419 Cutaneous abscess of limb, unspecified: Secondary | ICD-10-CM | POA: Diagnosis not present

## 2013-08-11 DIAGNOSIS — L03119 Cellulitis of unspecified part of limb: Secondary | ICD-10-CM | POA: Diagnosis not present

## 2013-08-11 DIAGNOSIS — Z7901 Long term (current) use of anticoagulants: Secondary | ICD-10-CM | POA: Diagnosis not present

## 2013-08-11 DIAGNOSIS — L02419 Cutaneous abscess of limb, unspecified: Secondary | ICD-10-CM | POA: Diagnosis not present

## 2013-08-11 DIAGNOSIS — I4891 Unspecified atrial fibrillation: Secondary | ICD-10-CM | POA: Diagnosis not present

## 2013-08-13 DIAGNOSIS — M19019 Primary osteoarthritis, unspecified shoulder: Secondary | ICD-10-CM | POA: Diagnosis not present

## 2013-08-20 DIAGNOSIS — L02419 Cutaneous abscess of limb, unspecified: Secondary | ICD-10-CM | POA: Diagnosis not present

## 2013-08-22 DIAGNOSIS — L03119 Cellulitis of unspecified part of limb: Secondary | ICD-10-CM | POA: Diagnosis not present

## 2013-08-22 DIAGNOSIS — G894 Chronic pain syndrome: Secondary | ICD-10-CM | POA: Diagnosis not present

## 2013-08-22 DIAGNOSIS — I4891 Unspecified atrial fibrillation: Secondary | ICD-10-CM | POA: Diagnosis not present

## 2013-08-22 DIAGNOSIS — Z7901 Long term (current) use of anticoagulants: Secondary | ICD-10-CM | POA: Diagnosis not present

## 2013-08-22 DIAGNOSIS — L02419 Cutaneous abscess of limb, unspecified: Secondary | ICD-10-CM | POA: Diagnosis not present

## 2013-08-28 DIAGNOSIS — Z7901 Long term (current) use of anticoagulants: Secondary | ICD-10-CM | POA: Diagnosis not present

## 2013-08-28 DIAGNOSIS — L02419 Cutaneous abscess of limb, unspecified: Secondary | ICD-10-CM | POA: Diagnosis not present

## 2013-08-28 DIAGNOSIS — L03119 Cellulitis of unspecified part of limb: Secondary | ICD-10-CM | POA: Diagnosis not present

## 2013-08-28 DIAGNOSIS — I4891 Unspecified atrial fibrillation: Secondary | ICD-10-CM | POA: Diagnosis not present

## 2013-09-02 DIAGNOSIS — I872 Venous insufficiency (chronic) (peripheral): Secondary | ICD-10-CM | POA: Diagnosis not present

## 2013-09-02 DIAGNOSIS — G894 Chronic pain syndrome: Secondary | ICD-10-CM | POA: Diagnosis not present

## 2013-09-02 DIAGNOSIS — Z7901 Long term (current) use of anticoagulants: Secondary | ICD-10-CM | POA: Diagnosis not present

## 2013-09-02 DIAGNOSIS — N390 Urinary tract infection, site not specified: Secondary | ICD-10-CM | POA: Diagnosis not present

## 2013-09-02 DIAGNOSIS — I4891 Unspecified atrial fibrillation: Secondary | ICD-10-CM | POA: Diagnosis not present

## 2013-09-02 DIAGNOSIS — I10 Essential (primary) hypertension: Secondary | ICD-10-CM | POA: Diagnosis not present

## 2013-09-02 DIAGNOSIS — R809 Proteinuria, unspecified: Secondary | ICD-10-CM | POA: Diagnosis not present

## 2013-09-02 DIAGNOSIS — E785 Hyperlipidemia, unspecified: Secondary | ICD-10-CM | POA: Diagnosis not present

## 2013-09-02 DIAGNOSIS — N182 Chronic kidney disease, stage 2 (mild): Secondary | ICD-10-CM | POA: Diagnosis not present

## 2013-09-02 DIAGNOSIS — E039 Hypothyroidism, unspecified: Secondary | ICD-10-CM | POA: Diagnosis not present

## 2013-09-02 DIAGNOSIS — Z1331 Encounter for screening for depression: Secondary | ICD-10-CM | POA: Diagnosis not present

## 2013-09-18 DIAGNOSIS — Z7901 Long term (current) use of anticoagulants: Secondary | ICD-10-CM | POA: Diagnosis not present

## 2013-09-18 DIAGNOSIS — I4891 Unspecified atrial fibrillation: Secondary | ICD-10-CM | POA: Diagnosis not present

## 2013-09-30 DIAGNOSIS — IMO0002 Reserved for concepts with insufficient information to code with codable children: Secondary | ICD-10-CM | POA: Diagnosis not present

## 2013-09-30 DIAGNOSIS — G894 Chronic pain syndrome: Secondary | ICD-10-CM | POA: Diagnosis not present

## 2013-09-30 DIAGNOSIS — M5137 Other intervertebral disc degeneration, lumbosacral region: Secondary | ICD-10-CM | POA: Diagnosis not present

## 2013-09-30 DIAGNOSIS — M19019 Primary osteoarthritis, unspecified shoulder: Secondary | ICD-10-CM | POA: Diagnosis not present

## 2013-09-30 DIAGNOSIS — M1710 Unilateral primary osteoarthritis, unspecified knee: Secondary | ICD-10-CM | POA: Diagnosis not present

## 2013-10-28 DIAGNOSIS — I872 Venous insufficiency (chronic) (peripheral): Secondary | ICD-10-CM | POA: Diagnosis not present

## 2013-10-28 DIAGNOSIS — Z7901 Long term (current) use of anticoagulants: Secondary | ICD-10-CM | POA: Diagnosis not present

## 2013-10-28 DIAGNOSIS — I4891 Unspecified atrial fibrillation: Secondary | ICD-10-CM | POA: Diagnosis not present

## 2013-10-28 DIAGNOSIS — L03119 Cellulitis of unspecified part of limb: Secondary | ICD-10-CM | POA: Diagnosis not present

## 2013-10-28 DIAGNOSIS — Z6828 Body mass index (BMI) 28.0-28.9, adult: Secondary | ICD-10-CM | POA: Diagnosis not present

## 2013-10-28 DIAGNOSIS — L02419 Cutaneous abscess of limb, unspecified: Secondary | ICD-10-CM | POA: Diagnosis not present

## 2013-10-28 DIAGNOSIS — I10 Essential (primary) hypertension: Secondary | ICD-10-CM | POA: Diagnosis not present

## 2013-11-04 DIAGNOSIS — Z6828 Body mass index (BMI) 28.0-28.9, adult: Secondary | ICD-10-CM | POA: Diagnosis not present

## 2013-11-04 DIAGNOSIS — I10 Essential (primary) hypertension: Secondary | ICD-10-CM | POA: Diagnosis not present

## 2013-11-04 DIAGNOSIS — Z23 Encounter for immunization: Secondary | ICD-10-CM | POA: Diagnosis not present

## 2013-11-04 DIAGNOSIS — I872 Venous insufficiency (chronic) (peripheral): Secondary | ICD-10-CM | POA: Diagnosis not present

## 2013-11-04 DIAGNOSIS — Z7901 Long term (current) use of anticoagulants: Secondary | ICD-10-CM | POA: Diagnosis not present

## 2013-11-04 DIAGNOSIS — L03119 Cellulitis of unspecified part of limb: Secondary | ICD-10-CM | POA: Diagnosis not present

## 2013-11-04 DIAGNOSIS — L02419 Cutaneous abscess of limb, unspecified: Secondary | ICD-10-CM | POA: Diagnosis not present

## 2013-11-04 DIAGNOSIS — I4891 Unspecified atrial fibrillation: Secondary | ICD-10-CM | POA: Diagnosis not present

## 2013-11-04 DIAGNOSIS — I509 Heart failure, unspecified: Secondary | ICD-10-CM | POA: Diagnosis not present

## 2013-11-14 DIAGNOSIS — M19011 Primary osteoarthritis, right shoulder: Secondary | ICD-10-CM | POA: Diagnosis not present

## 2013-12-02 DIAGNOSIS — Z7901 Long term (current) use of anticoagulants: Secondary | ICD-10-CM | POA: Diagnosis not present

## 2013-12-02 DIAGNOSIS — I480 Paroxysmal atrial fibrillation: Secondary | ICD-10-CM | POA: Diagnosis not present

## 2013-12-02 DIAGNOSIS — L03116 Cellulitis of left lower limb: Secondary | ICD-10-CM | POA: Diagnosis not present

## 2013-12-15 DIAGNOSIS — I872 Venous insufficiency (chronic) (peripheral): Secondary | ICD-10-CM | POA: Diagnosis not present

## 2013-12-15 DIAGNOSIS — Z7901 Long term (current) use of anticoagulants: Secondary | ICD-10-CM | POA: Diagnosis not present

## 2013-12-15 DIAGNOSIS — I480 Paroxysmal atrial fibrillation: Secondary | ICD-10-CM | POA: Diagnosis not present

## 2013-12-15 DIAGNOSIS — L03116 Cellulitis of left lower limb: Secondary | ICD-10-CM | POA: Diagnosis not present

## 2013-12-23 DIAGNOSIS — M179 Osteoarthritis of knee, unspecified: Secondary | ICD-10-CM | POA: Diagnosis not present

## 2013-12-23 DIAGNOSIS — M169 Osteoarthritis of hip, unspecified: Secondary | ICD-10-CM | POA: Diagnosis not present

## 2013-12-23 DIAGNOSIS — Z79891 Long term (current) use of opiate analgesic: Secondary | ICD-10-CM | POA: Diagnosis not present

## 2013-12-23 DIAGNOSIS — Z79899 Other long term (current) drug therapy: Secondary | ICD-10-CM | POA: Diagnosis not present

## 2013-12-23 DIAGNOSIS — G5790 Unspecified mononeuropathy of unspecified lower limb: Secondary | ICD-10-CM | POA: Diagnosis not present

## 2013-12-23 DIAGNOSIS — M791 Myalgia: Secondary | ICD-10-CM | POA: Diagnosis not present

## 2013-12-23 DIAGNOSIS — G894 Chronic pain syndrome: Secondary | ICD-10-CM | POA: Diagnosis not present

## 2014-01-13 DIAGNOSIS — Z7901 Long term (current) use of anticoagulants: Secondary | ICD-10-CM | POA: Diagnosis not present

## 2014-01-13 DIAGNOSIS — I340 Nonrheumatic mitral (valve) insufficiency: Secondary | ICD-10-CM | POA: Diagnosis not present

## 2014-01-13 DIAGNOSIS — I482 Chronic atrial fibrillation: Secondary | ICD-10-CM | POA: Diagnosis not present

## 2014-01-13 DIAGNOSIS — I119 Hypertensive heart disease without heart failure: Secondary | ICD-10-CM | POA: Diagnosis not present

## 2014-01-14 DIAGNOSIS — Z7901 Long term (current) use of anticoagulants: Secondary | ICD-10-CM | POA: Diagnosis not present

## 2014-01-14 DIAGNOSIS — I480 Paroxysmal atrial fibrillation: Secondary | ICD-10-CM | POA: Diagnosis not present

## 2014-01-28 DIAGNOSIS — I480 Paroxysmal atrial fibrillation: Secondary | ICD-10-CM | POA: Diagnosis not present

## 2014-01-28 DIAGNOSIS — Z7901 Long term (current) use of anticoagulants: Secondary | ICD-10-CM | POA: Diagnosis not present

## 2014-02-24 DIAGNOSIS — C4441 Basal cell carcinoma of skin of scalp and neck: Secondary | ICD-10-CM | POA: Diagnosis not present

## 2014-02-24 DIAGNOSIS — D485 Neoplasm of uncertain behavior of skin: Secondary | ICD-10-CM | POA: Diagnosis not present

## 2014-03-05 DIAGNOSIS — Z7901 Long term (current) use of anticoagulants: Secondary | ICD-10-CM | POA: Diagnosis not present

## 2014-03-05 DIAGNOSIS — I480 Paroxysmal atrial fibrillation: Secondary | ICD-10-CM | POA: Diagnosis not present

## 2014-03-05 DIAGNOSIS — C44319 Basal cell carcinoma of skin of other parts of face: Secondary | ICD-10-CM | POA: Diagnosis not present

## 2014-03-05 DIAGNOSIS — D485 Neoplasm of uncertain behavior of skin: Secondary | ICD-10-CM | POA: Diagnosis not present

## 2014-03-17 DIAGNOSIS — L853 Xerosis cutis: Secondary | ICD-10-CM | POA: Diagnosis not present

## 2014-03-24 DIAGNOSIS — M545 Low back pain: Secondary | ICD-10-CM | POA: Diagnosis not present

## 2014-03-24 DIAGNOSIS — Z79899 Other long term (current) drug therapy: Secondary | ICD-10-CM | POA: Diagnosis not present

## 2014-03-24 DIAGNOSIS — M79606 Pain in leg, unspecified: Secondary | ICD-10-CM | POA: Diagnosis not present

## 2014-03-24 DIAGNOSIS — G894 Chronic pain syndrome: Secondary | ICD-10-CM | POA: Diagnosis not present

## 2014-03-31 DIAGNOSIS — I480 Paroxysmal atrial fibrillation: Secondary | ICD-10-CM | POA: Diagnosis not present

## 2014-03-31 DIAGNOSIS — Z7901 Long term (current) use of anticoagulants: Secondary | ICD-10-CM | POA: Diagnosis not present

## 2014-04-15 DIAGNOSIS — I480 Paroxysmal atrial fibrillation: Secondary | ICD-10-CM | POA: Diagnosis not present

## 2014-04-15 DIAGNOSIS — Z7901 Long term (current) use of anticoagulants: Secondary | ICD-10-CM | POA: Diagnosis not present

## 2014-05-09 DIAGNOSIS — M19012 Primary osteoarthritis, left shoulder: Secondary | ICD-10-CM | POA: Diagnosis not present

## 2014-05-09 DIAGNOSIS — M19011 Primary osteoarthritis, right shoulder: Secondary | ICD-10-CM | POA: Diagnosis not present

## 2014-05-20 DIAGNOSIS — Z7901 Long term (current) use of anticoagulants: Secondary | ICD-10-CM | POA: Diagnosis not present

## 2014-05-20 DIAGNOSIS — I480 Paroxysmal atrial fibrillation: Secondary | ICD-10-CM | POA: Diagnosis not present

## 2014-06-03 DIAGNOSIS — I872 Venous insufficiency (chronic) (peripheral): Secondary | ICD-10-CM | POA: Diagnosis not present

## 2014-06-03 DIAGNOSIS — R600 Localized edema: Secondary | ICD-10-CM | POA: Diagnosis not present

## 2014-06-03 DIAGNOSIS — L0889 Other specified local infections of the skin and subcutaneous tissue: Secondary | ICD-10-CM | POA: Diagnosis not present

## 2014-06-03 DIAGNOSIS — Z6827 Body mass index (BMI) 27.0-27.9, adult: Secondary | ICD-10-CM | POA: Diagnosis not present

## 2014-06-03 DIAGNOSIS — N182 Chronic kidney disease, stage 2 (mild): Secondary | ICD-10-CM | POA: Diagnosis not present

## 2014-06-15 DIAGNOSIS — M79603 Pain in arm, unspecified: Secondary | ICD-10-CM | POA: Diagnosis not present

## 2014-06-15 DIAGNOSIS — Z79899 Other long term (current) drug therapy: Secondary | ICD-10-CM | POA: Diagnosis not present

## 2014-06-15 DIAGNOSIS — M792 Neuralgia and neuritis, unspecified: Secondary | ICD-10-CM | POA: Diagnosis not present

## 2014-06-15 DIAGNOSIS — G894 Chronic pain syndrome: Secondary | ICD-10-CM | POA: Diagnosis not present

## 2014-06-15 DIAGNOSIS — M79606 Pain in leg, unspecified: Secondary | ICD-10-CM | POA: Diagnosis not present

## 2014-06-16 DIAGNOSIS — L570 Actinic keratosis: Secondary | ICD-10-CM | POA: Diagnosis not present

## 2014-06-18 DIAGNOSIS — I480 Paroxysmal atrial fibrillation: Secondary | ICD-10-CM | POA: Diagnosis not present

## 2014-06-18 DIAGNOSIS — Z7901 Long term (current) use of anticoagulants: Secondary | ICD-10-CM | POA: Diagnosis not present

## 2014-06-23 DIAGNOSIS — D485 Neoplasm of uncertain behavior of skin: Secondary | ICD-10-CM | POA: Diagnosis not present

## 2014-06-23 DIAGNOSIS — D044 Carcinoma in situ of skin of scalp and neck: Secondary | ICD-10-CM | POA: Diagnosis not present

## 2014-06-29 DIAGNOSIS — I480 Paroxysmal atrial fibrillation: Secondary | ICD-10-CM | POA: Diagnosis not present

## 2014-06-29 DIAGNOSIS — Z7901 Long term (current) use of anticoagulants: Secondary | ICD-10-CM | POA: Diagnosis not present

## 2014-07-02 DIAGNOSIS — I871 Compression of vein: Secondary | ICD-10-CM | POA: Diagnosis not present

## 2014-07-10 DIAGNOSIS — R197 Diarrhea, unspecified: Secondary | ICD-10-CM | POA: Diagnosis not present

## 2014-07-10 DIAGNOSIS — I8310 Varicose veins of unspecified lower extremity with inflammation: Secondary | ICD-10-CM | POA: Diagnosis not present

## 2014-07-10 DIAGNOSIS — Z6827 Body mass index (BMI) 27.0-27.9, adult: Secondary | ICD-10-CM | POA: Diagnosis not present

## 2014-07-10 DIAGNOSIS — I890 Lymphedema, not elsewhere classified: Secondary | ICD-10-CM | POA: Diagnosis not present

## 2014-07-10 DIAGNOSIS — L03116 Cellulitis of left lower limb: Secondary | ICD-10-CM | POA: Diagnosis not present

## 2014-07-15 DIAGNOSIS — I8310 Varicose veins of unspecified lower extremity with inflammation: Secondary | ICD-10-CM | POA: Diagnosis not present

## 2014-07-15 DIAGNOSIS — R1030 Lower abdominal pain, unspecified: Secondary | ICD-10-CM | POA: Diagnosis not present

## 2014-07-15 DIAGNOSIS — Z6827 Body mass index (BMI) 27.0-27.9, adult: Secondary | ICD-10-CM | POA: Diagnosis not present

## 2014-07-15 DIAGNOSIS — I890 Lymphedema, not elsewhere classified: Secondary | ICD-10-CM | POA: Diagnosis not present

## 2014-07-15 DIAGNOSIS — R197 Diarrhea, unspecified: Secondary | ICD-10-CM | POA: Diagnosis not present

## 2014-07-15 DIAGNOSIS — K5900 Constipation, unspecified: Secondary | ICD-10-CM | POA: Diagnosis not present

## 2014-07-20 DIAGNOSIS — E785 Hyperlipidemia, unspecified: Secondary | ICD-10-CM | POA: Diagnosis not present

## 2014-07-20 DIAGNOSIS — E039 Hypothyroidism, unspecified: Secondary | ICD-10-CM | POA: Diagnosis not present

## 2014-07-20 DIAGNOSIS — I482 Chronic atrial fibrillation: Secondary | ICD-10-CM | POA: Diagnosis not present

## 2014-07-20 DIAGNOSIS — J45998 Other asthma: Secondary | ICD-10-CM | POA: Diagnosis not present

## 2014-07-20 DIAGNOSIS — R197 Diarrhea, unspecified: Secondary | ICD-10-CM | POA: Diagnosis not present

## 2014-07-20 DIAGNOSIS — I340 Nonrheumatic mitral (valve) insufficiency: Secondary | ICD-10-CM | POA: Diagnosis not present

## 2014-07-20 DIAGNOSIS — I119 Hypertensive heart disease without heart failure: Secondary | ICD-10-CM | POA: Diagnosis not present

## 2014-07-20 DIAGNOSIS — Z7901 Long term (current) use of anticoagulants: Secondary | ICD-10-CM | POA: Diagnosis not present

## 2014-07-20 DIAGNOSIS — I480 Paroxysmal atrial fibrillation: Secondary | ICD-10-CM | POA: Diagnosis not present

## 2014-07-22 DIAGNOSIS — K5900 Constipation, unspecified: Secondary | ICD-10-CM | POA: Diagnosis not present

## 2014-07-22 DIAGNOSIS — I8310 Varicose veins of unspecified lower extremity with inflammation: Secondary | ICD-10-CM | POA: Diagnosis not present

## 2014-07-22 DIAGNOSIS — I890 Lymphedema, not elsewhere classified: Secondary | ICD-10-CM | POA: Diagnosis not present

## 2014-07-22 DIAGNOSIS — K589 Irritable bowel syndrome without diarrhea: Secondary | ICD-10-CM | POA: Diagnosis not present

## 2014-07-28 DIAGNOSIS — I8310 Varicose veins of unspecified lower extremity with inflammation: Secondary | ICD-10-CM | POA: Diagnosis not present

## 2014-07-28 DIAGNOSIS — I890 Lymphedema, not elsewhere classified: Secondary | ICD-10-CM | POA: Diagnosis not present

## 2014-07-29 DIAGNOSIS — Z7901 Long term (current) use of anticoagulants: Secondary | ICD-10-CM | POA: Diagnosis not present

## 2014-07-29 DIAGNOSIS — K5900 Constipation, unspecified: Secondary | ICD-10-CM | POA: Diagnosis not present

## 2014-07-29 DIAGNOSIS — I480 Paroxysmal atrial fibrillation: Secondary | ICD-10-CM | POA: Diagnosis not present

## 2014-07-29 DIAGNOSIS — I87319 Chronic venous hypertension (idiopathic) with ulcer of unspecified lower extremity: Secondary | ICD-10-CM | POA: Diagnosis not present

## 2014-08-04 DIAGNOSIS — I8310 Varicose veins of unspecified lower extremity with inflammation: Secondary | ICD-10-CM | POA: Diagnosis not present

## 2014-08-04 DIAGNOSIS — I890 Lymphedema, not elsewhere classified: Secondary | ICD-10-CM | POA: Diagnosis not present

## 2014-08-10 DIAGNOSIS — I8310 Varicose veins of unspecified lower extremity with inflammation: Secondary | ICD-10-CM | POA: Diagnosis not present

## 2014-08-10 DIAGNOSIS — I890 Lymphedema, not elsewhere classified: Secondary | ICD-10-CM | POA: Diagnosis not present

## 2014-08-10 DIAGNOSIS — M19011 Primary osteoarthritis, right shoulder: Secondary | ICD-10-CM | POA: Diagnosis not present

## 2014-08-10 DIAGNOSIS — M19012 Primary osteoarthritis, left shoulder: Secondary | ICD-10-CM | POA: Diagnosis not present

## 2014-08-18 DIAGNOSIS — I890 Lymphedema, not elsewhere classified: Secondary | ICD-10-CM | POA: Diagnosis not present

## 2014-08-18 DIAGNOSIS — I8310 Varicose veins of unspecified lower extremity with inflammation: Secondary | ICD-10-CM | POA: Diagnosis not present

## 2014-08-26 DIAGNOSIS — Z7901 Long term (current) use of anticoagulants: Secondary | ICD-10-CM | POA: Diagnosis not present

## 2014-08-26 DIAGNOSIS — I8310 Varicose veins of unspecified lower extremity with inflammation: Secondary | ICD-10-CM | POA: Diagnosis not present

## 2014-08-26 DIAGNOSIS — I480 Paroxysmal atrial fibrillation: Secondary | ICD-10-CM | POA: Diagnosis not present

## 2014-08-26 DIAGNOSIS — I890 Lymphedema, not elsewhere classified: Secondary | ICD-10-CM | POA: Diagnosis not present

## 2014-08-26 DIAGNOSIS — Z6827 Body mass index (BMI) 27.0-27.9, adult: Secondary | ICD-10-CM | POA: Diagnosis not present

## 2014-08-26 DIAGNOSIS — K5900 Constipation, unspecified: Secondary | ICD-10-CM | POA: Diagnosis not present

## 2014-08-26 DIAGNOSIS — M1990 Unspecified osteoarthritis, unspecified site: Secondary | ICD-10-CM | POA: Diagnosis not present

## 2014-09-01 DIAGNOSIS — I8310 Varicose veins of unspecified lower extremity with inflammation: Secondary | ICD-10-CM | POA: Diagnosis not present

## 2014-09-01 DIAGNOSIS — I890 Lymphedema, not elsewhere classified: Secondary | ICD-10-CM | POA: Diagnosis not present

## 2014-09-07 DIAGNOSIS — Z79899 Other long term (current) drug therapy: Secondary | ICD-10-CM | POA: Diagnosis not present

## 2014-09-07 DIAGNOSIS — G894 Chronic pain syndrome: Secondary | ICD-10-CM | POA: Diagnosis not present

## 2014-09-07 DIAGNOSIS — G629 Polyneuropathy, unspecified: Secondary | ICD-10-CM | POA: Diagnosis not present

## 2014-09-07 DIAGNOSIS — M1990 Unspecified osteoarthritis, unspecified site: Secondary | ICD-10-CM | POA: Diagnosis not present

## 2014-09-07 DIAGNOSIS — M25519 Pain in unspecified shoulder: Secondary | ICD-10-CM | POA: Diagnosis not present

## 2014-09-07 DIAGNOSIS — M79603 Pain in arm, unspecified: Secondary | ICD-10-CM | POA: Diagnosis not present

## 2014-09-25 DIAGNOSIS — I480 Paroxysmal atrial fibrillation: Secondary | ICD-10-CM | POA: Diagnosis not present

## 2014-09-25 DIAGNOSIS — Z1389 Encounter for screening for other disorder: Secondary | ICD-10-CM | POA: Diagnosis not present

## 2014-09-25 DIAGNOSIS — R8299 Other abnormal findings in urine: Secondary | ICD-10-CM | POA: Diagnosis not present

## 2014-09-25 DIAGNOSIS — I8310 Varicose veins of unspecified lower extremity with inflammation: Secondary | ICD-10-CM | POA: Diagnosis not present

## 2014-09-25 DIAGNOSIS — E785 Hyperlipidemia, unspecified: Secondary | ICD-10-CM | POA: Diagnosis not present

## 2014-09-25 DIAGNOSIS — N390 Urinary tract infection, site not specified: Secondary | ICD-10-CM | POA: Diagnosis not present

## 2014-09-25 DIAGNOSIS — M1990 Unspecified osteoarthritis, unspecified site: Secondary | ICD-10-CM | POA: Diagnosis not present

## 2014-09-25 DIAGNOSIS — E039 Hypothyroidism, unspecified: Secondary | ICD-10-CM | POA: Diagnosis not present

## 2014-09-25 DIAGNOSIS — R197 Diarrhea, unspecified: Secondary | ICD-10-CM | POA: Diagnosis not present

## 2014-09-25 DIAGNOSIS — I10 Essential (primary) hypertension: Secondary | ICD-10-CM | POA: Diagnosis not present

## 2014-09-25 DIAGNOSIS — G894 Chronic pain syndrome: Secondary | ICD-10-CM | POA: Diagnosis not present

## 2014-09-25 DIAGNOSIS — Z23 Encounter for immunization: Secondary | ICD-10-CM | POA: Diagnosis not present

## 2014-10-01 DIAGNOSIS — I871 Compression of vein: Secondary | ICD-10-CM | POA: Diagnosis not present

## 2014-10-01 DIAGNOSIS — D044 Carcinoma in situ of skin of scalp and neck: Secondary | ICD-10-CM | POA: Diagnosis not present

## 2014-10-08 DIAGNOSIS — Z7901 Long term (current) use of anticoagulants: Secondary | ICD-10-CM | POA: Diagnosis not present

## 2014-10-08 DIAGNOSIS — L570 Actinic keratosis: Secondary | ICD-10-CM | POA: Diagnosis not present

## 2014-10-08 DIAGNOSIS — D485 Neoplasm of uncertain behavior of skin: Secondary | ICD-10-CM | POA: Diagnosis not present

## 2014-10-08 DIAGNOSIS — E039 Hypothyroidism, unspecified: Secondary | ICD-10-CM | POA: Diagnosis not present

## 2014-10-22 DIAGNOSIS — D044 Carcinoma in situ of skin of scalp and neck: Secondary | ICD-10-CM | POA: Diagnosis not present

## 2014-10-22 DIAGNOSIS — B070 Plantar wart: Secondary | ICD-10-CM | POA: Diagnosis not present

## 2014-10-22 DIAGNOSIS — I871 Compression of vein: Secondary | ICD-10-CM | POA: Diagnosis not present

## 2014-10-22 DIAGNOSIS — L570 Actinic keratosis: Secondary | ICD-10-CM | POA: Diagnosis not present

## 2014-11-05 DIAGNOSIS — L570 Actinic keratosis: Secondary | ICD-10-CM | POA: Diagnosis not present

## 2014-11-05 DIAGNOSIS — D044 Carcinoma in situ of skin of scalp and neck: Secondary | ICD-10-CM | POA: Diagnosis not present

## 2014-11-09 DIAGNOSIS — M19011 Primary osteoarthritis, right shoulder: Secondary | ICD-10-CM | POA: Diagnosis not present

## 2014-11-09 DIAGNOSIS — M19012 Primary osteoarthritis, left shoulder: Secondary | ICD-10-CM | POA: Diagnosis not present

## 2014-11-19 DIAGNOSIS — B070 Plantar wart: Secondary | ICD-10-CM | POA: Diagnosis not present

## 2014-11-19 DIAGNOSIS — L570 Actinic keratosis: Secondary | ICD-10-CM | POA: Diagnosis not present

## 2015-01-01 DIAGNOSIS — I8310 Varicose veins of unspecified lower extremity with inflammation: Secondary | ICD-10-CM | POA: Diagnosis not present

## 2015-01-01 DIAGNOSIS — Z23 Encounter for immunization: Secondary | ICD-10-CM | POA: Diagnosis not present

## 2015-01-01 DIAGNOSIS — I480 Paroxysmal atrial fibrillation: Secondary | ICD-10-CM | POA: Diagnosis not present

## 2015-01-01 DIAGNOSIS — Z7901 Long term (current) use of anticoagulants: Secondary | ICD-10-CM | POA: Diagnosis not present

## 2015-01-01 DIAGNOSIS — G894 Chronic pain syndrome: Secondary | ICD-10-CM | POA: Diagnosis not present

## 2015-01-01 DIAGNOSIS — Z6830 Body mass index (BMI) 30.0-30.9, adult: Secondary | ICD-10-CM | POA: Diagnosis not present

## 2015-01-01 DIAGNOSIS — R197 Diarrhea, unspecified: Secondary | ICD-10-CM | POA: Diagnosis not present

## 2015-01-01 DIAGNOSIS — J309 Allergic rhinitis, unspecified: Secondary | ICD-10-CM | POA: Diagnosis not present

## 2015-01-18 DIAGNOSIS — I482 Chronic atrial fibrillation: Secondary | ICD-10-CM | POA: Diagnosis not present

## 2015-01-18 DIAGNOSIS — E785 Hyperlipidemia, unspecified: Secondary | ICD-10-CM | POA: Diagnosis not present

## 2015-01-18 DIAGNOSIS — Z7901 Long term (current) use of anticoagulants: Secondary | ICD-10-CM | POA: Diagnosis not present

## 2015-01-18 DIAGNOSIS — I340 Nonrheumatic mitral (valve) insufficiency: Secondary | ICD-10-CM | POA: Diagnosis not present

## 2015-01-18 DIAGNOSIS — E039 Hypothyroidism, unspecified: Secondary | ICD-10-CM | POA: Diagnosis not present

## 2015-01-18 DIAGNOSIS — I119 Hypertensive heart disease without heart failure: Secondary | ICD-10-CM | POA: Diagnosis not present

## 2015-01-18 DIAGNOSIS — J45998 Other asthma: Secondary | ICD-10-CM | POA: Diagnosis not present

## 2015-02-01 DIAGNOSIS — I480 Paroxysmal atrial fibrillation: Secondary | ICD-10-CM | POA: Diagnosis not present

## 2015-02-01 DIAGNOSIS — G894 Chronic pain syndrome: Secondary | ICD-10-CM | POA: Diagnosis not present

## 2015-02-01 DIAGNOSIS — Z7901 Long term (current) use of anticoagulants: Secondary | ICD-10-CM | POA: Diagnosis not present

## 2015-02-03 DIAGNOSIS — M19011 Primary osteoarthritis, right shoulder: Secondary | ICD-10-CM | POA: Diagnosis not present

## 2015-02-03 DIAGNOSIS — M19012 Primary osteoarthritis, left shoulder: Secondary | ICD-10-CM | POA: Diagnosis not present

## 2015-02-25 DIAGNOSIS — I480 Paroxysmal atrial fibrillation: Secondary | ICD-10-CM | POA: Diagnosis not present

## 2015-02-25 DIAGNOSIS — Z7901 Long term (current) use of anticoagulants: Secondary | ICD-10-CM | POA: Diagnosis not present

## 2015-03-01 DIAGNOSIS — M1711 Unilateral primary osteoarthritis, right knee: Secondary | ICD-10-CM | POA: Diagnosis not present

## 2015-03-11 DIAGNOSIS — Z7901 Long term (current) use of anticoagulants: Secondary | ICD-10-CM | POA: Diagnosis not present

## 2015-03-11 DIAGNOSIS — I480 Paroxysmal atrial fibrillation: Secondary | ICD-10-CM | POA: Diagnosis not present

## 2015-04-05 DIAGNOSIS — I509 Heart failure, unspecified: Secondary | ICD-10-CM | POA: Diagnosis not present

## 2015-04-05 DIAGNOSIS — Z9889 Other specified postprocedural states: Secondary | ICD-10-CM | POA: Diagnosis not present

## 2015-04-05 DIAGNOSIS — I480 Paroxysmal atrial fibrillation: Secondary | ICD-10-CM | POA: Diagnosis not present

## 2015-04-05 DIAGNOSIS — M1990 Unspecified osteoarthritis, unspecified site: Secondary | ICD-10-CM | POA: Diagnosis not present

## 2015-04-05 DIAGNOSIS — G894 Chronic pain syndrome: Secondary | ICD-10-CM | POA: Diagnosis not present

## 2015-04-05 DIAGNOSIS — I87319 Chronic venous hypertension (idiopathic) with ulcer of unspecified lower extremity: Secondary | ICD-10-CM | POA: Diagnosis not present

## 2015-04-05 DIAGNOSIS — I10 Essential (primary) hypertension: Secondary | ICD-10-CM | POA: Diagnosis not present

## 2015-04-05 DIAGNOSIS — Z7901 Long term (current) use of anticoagulants: Secondary | ICD-10-CM | POA: Diagnosis not present

## 2015-04-12 DIAGNOSIS — I5032 Chronic diastolic (congestive) heart failure: Secondary | ICD-10-CM | POA: Diagnosis not present

## 2015-04-12 DIAGNOSIS — L97222 Non-pressure chronic ulcer of left calf with fat layer exposed: Secondary | ICD-10-CM | POA: Diagnosis not present

## 2015-04-12 DIAGNOSIS — I2510 Atherosclerotic heart disease of native coronary artery without angina pectoris: Secondary | ICD-10-CM | POA: Diagnosis not present

## 2015-04-12 DIAGNOSIS — I87319 Chronic venous hypertension (idiopathic) with ulcer of unspecified lower extremity: Secondary | ICD-10-CM | POA: Diagnosis not present

## 2015-04-14 ENCOUNTER — Emergency Department (HOSPITAL_COMMUNITY)
Admission: EM | Admit: 2015-04-14 | Discharge: 2015-04-14 | Disposition: A | Discharge status: Home / Self Care | Payer: Medicare Other | Attending: Emergency Medicine | Admitting: Emergency Medicine

## 2015-04-14 ENCOUNTER — Emergency Department (HOSPITAL_COMMUNITY): Payer: Medicare Other

## 2015-04-14 ENCOUNTER — Encounter (HOSPITAL_COMMUNITY): Payer: Self-pay | Admitting: Emergency Medicine

## 2015-04-14 DIAGNOSIS — S40012A Contusion of left shoulder, initial encounter: Secondary | ICD-10-CM | POA: Insufficient documentation

## 2015-04-14 DIAGNOSIS — S0093XA Contusion of unspecified part of head, initial encounter: Secondary | ICD-10-CM | POA: Diagnosis not present

## 2015-04-14 DIAGNOSIS — S4992XA Unspecified injury of left shoulder and upper arm, initial encounter: Secondary | ICD-10-CM | POA: Diagnosis not present

## 2015-04-14 DIAGNOSIS — S0083XA Contusion of other part of head, initial encounter: Secondary | ICD-10-CM | POA: Diagnosis not present

## 2015-04-14 DIAGNOSIS — I2510 Atherosclerotic heart disease of native coronary artery without angina pectoris: Secondary | ICD-10-CM | POA: Diagnosis not present

## 2015-04-14 DIAGNOSIS — M25512 Pain in left shoulder: Secondary | ICD-10-CM | POA: Diagnosis not present

## 2015-04-14 DIAGNOSIS — Z7901 Long term (current) use of anticoagulants: Secondary | ICD-10-CM | POA: Insufficient documentation

## 2015-04-14 DIAGNOSIS — S41109A Unspecified open wound of unspecified upper arm, initial encounter: Secondary | ICD-10-CM | POA: Diagnosis not present

## 2015-04-14 DIAGNOSIS — W1839XA Other fall on same level, initial encounter: Secondary | ICD-10-CM | POA: Diagnosis not present

## 2015-04-14 DIAGNOSIS — W19XXXA Unspecified fall, initial encounter: Secondary | ICD-10-CM

## 2015-04-14 DIAGNOSIS — E039 Hypothyroidism, unspecified: Secondary | ICD-10-CM | POA: Diagnosis not present

## 2015-04-14 DIAGNOSIS — M25522 Pain in left elbow: Secondary | ICD-10-CM | POA: Diagnosis not present

## 2015-04-14 DIAGNOSIS — Y999 Unspecified external cause status: Secondary | ICD-10-CM | POA: Diagnosis not present

## 2015-04-14 DIAGNOSIS — Z79899 Other long term (current) drug therapy: Secondary | ICD-10-CM | POA: Diagnosis not present

## 2015-04-14 DIAGNOSIS — Z87891 Personal history of nicotine dependence: Secondary | ICD-10-CM | POA: Diagnosis not present

## 2015-04-14 DIAGNOSIS — S40022A Contusion of left upper arm, initial encounter: Secondary | ICD-10-CM

## 2015-04-14 DIAGNOSIS — I87319 Chronic venous hypertension (idiopathic) with ulcer of unspecified lower extremity: Secondary | ICD-10-CM | POA: Diagnosis not present

## 2015-04-14 DIAGNOSIS — Y92012 Bathroom of single-family (private) house as the place of occurrence of the external cause: Secondary | ICD-10-CM | POA: Insufficient documentation

## 2015-04-14 DIAGNOSIS — S5002XA Contusion of left elbow, initial encounter: Secondary | ICD-10-CM | POA: Insufficient documentation

## 2015-04-14 DIAGNOSIS — Y939 Activity, unspecified: Secondary | ICD-10-CM | POA: Insufficient documentation

## 2015-04-14 DIAGNOSIS — I10 Essential (primary) hypertension: Secondary | ICD-10-CM | POA: Diagnosis not present

## 2015-04-14 DIAGNOSIS — Z792 Long term (current) use of antibiotics: Secondary | ICD-10-CM | POA: Insufficient documentation

## 2015-04-14 DIAGNOSIS — L97222 Non-pressure chronic ulcer of left calf with fat layer exposed: Secondary | ICD-10-CM | POA: Diagnosis not present

## 2015-04-14 DIAGNOSIS — S0990XA Unspecified injury of head, initial encounter: Secondary | ICD-10-CM | POA: Diagnosis present

## 2015-04-14 DIAGNOSIS — S199XXA Unspecified injury of neck, initial encounter: Secondary | ICD-10-CM | POA: Diagnosis not present

## 2015-04-14 LAB — PROTIME-INR
INR: 1.53 — ABNORMAL HIGH (ref 0.00–1.49)
Prothrombin Time: 18.5 seconds — ABNORMAL HIGH (ref 11.6–15.2)

## 2015-04-14 LAB — CBC WITH DIFFERENTIAL/PLATELET
BASOS ABS: 0 10*3/uL (ref 0.0–0.1)
BASOS PCT: 0 %
Eosinophils Absolute: 0.1 10*3/uL (ref 0.0–0.7)
Eosinophils Relative: 1 %
HEMATOCRIT: 36.9 % (ref 36.0–46.0)
HEMOGLOBIN: 11.7 g/dL — AB (ref 12.0–15.0)
Lymphocytes Relative: 14 %
Lymphs Abs: 1.2 10*3/uL (ref 0.7–4.0)
MCH: 26.7 pg (ref 26.0–34.0)
MCHC: 31.7 g/dL (ref 30.0–36.0)
MCV: 84.2 fL (ref 78.0–100.0)
Monocytes Absolute: 0.8 10*3/uL (ref 0.1–1.0)
Monocytes Relative: 9 %
NEUTROS ABS: 6 10*3/uL (ref 1.7–7.7)
NEUTROS PCT: 75 %
Platelets: 193 10*3/uL (ref 150–400)
RBC: 4.38 MIL/uL (ref 3.87–5.11)
RDW: 16.3 % — ABNORMAL HIGH (ref 11.5–15.5)
WBC: 8 10*3/uL (ref 4.0–10.5)

## 2015-04-14 LAB — BASIC METABOLIC PANEL
ANION GAP: 10 (ref 5–15)
BUN: 23 mg/dL — ABNORMAL HIGH (ref 6–20)
CHLORIDE: 98 mmol/L — AB (ref 101–111)
CO2: 31 mmol/L (ref 22–32)
CREATININE: 0.85 mg/dL (ref 0.44–1.00)
Calcium: 9.4 mg/dL (ref 8.9–10.3)
GFR calc non Af Amer: >60 mL/min (ref >60)
Glucose, Bld: 104 mg/dL — ABNORMAL HIGH (ref 65–99)
Potassium: 3.7 mmol/L (ref 3.5–5.1)
Sodium: 139 mmol/L (ref 135–145)

## 2015-04-14 MED ORDER — BACITRACIN ZINC 500 UNIT/GM EX OINT
TOPICAL_OINTMENT | CUTANEOUS | Status: AC
  Administered 2015-04-14: 3
  Filled 2015-04-14: qty 2.7

## 2015-04-14 NOTE — ED Notes (Signed)
Pt states she stumbled and fell on left shoulder. Pt has skin tear to left forearm.

## 2015-04-14 NOTE — ED Provider Notes (Signed)
Care accepted from Dr. Dina Rich. Patient's labs and x-rays pending. X-rays returned with no acute abdomen on these. I discussed this with the patient and her son. Plan to ambulate patient and will discharge her if she is able to ambulate. She states she is already gotten up to bedside commode. Her head CT was negative, however she is on blood thinners. I discussed return precautions specifically regarding bleeding intracranially with her son. They voice understanding.  Pattricia Boss, MD 04/14/15 [phone]

## 2015-04-14 NOTE — ED Provider Notes (Signed)
CSN: [id number]     Arrival date & time 04/14/15  [id number] History   First MD Initiated Contact with Patient 04/14/15 [phone]     Chief Complaint  Patient presents with  . Fall     (Consider location/radiation/quality/duration/timing/severity/associated sxs/prior Treatment) HPI  This is an 82 year old with a history of hypertension, coronary artery disease who presents following a fall. Patient states that she stood up to go to bathroom with her walker this morning. She lost her balance and fell backward. She denies syncope. She states she did hit the right side of her head. She denies loss of consciousness. She also hit her left shoulder and elbow.  Denies any recent illnesses or fevers. Is on chronic pain medication and a fentanyl patch. She took a tramadol prior to arrival.  Current pain score is 4 out of 10.  Past Medical History  Diagnosis Date  . Hypertension   . Thyroid disease   . Coronary artery disease   . Panic attacks   . Vitamin D deficiency   . Hypothyroidism   . RLS (restless legs syndrome)    Past Surgical History  Procedure Laterality Date  . Joint replacement     History reviewed. No pertinent family history. Social History  Substance Use Topics  . Smoking status: Former Smoker    Quit date: 02/13/1966  . Smokeless tobacco: None  . Alcohol Use: No   OB History    No data available     Review of Systems  Respiratory: Negative for chest tightness and shortness of breath.   Cardiovascular: Negative for chest pain.  Gastrointestinal: Negative for abdominal pain.  Musculoskeletal: Negative for back pain.       Left shoulder and elbow pain  Skin: Positive for wound.  Neurological: Negative for syncope.  Psychiatric/Behavioral: Negative for confusion.  All other systems reviewed and are negative.     Allergies  Codeine  Home Medications   Prior to Admission medications   Medication Sig Start Date End Date Taking? Authorizing Provider  ALPRAZolam Duanne Moron)  1 MG tablet Take 1-2 mg by mouth See admin instructions. Take 1 tablet in the morning and 2 tablets at bedtime.   Yes Historical Provider, MD  aspirin EC 81 MG tablet Take 81 mg by mouth daily.   Yes Historical Provider, MD  diltiazem (CARDIZEM CD) 240 MG 24 hr capsule Take 240 mg by mouth daily.   Yes Historical Provider, MD  DULoxetine (CYMBALTA) 30 MG capsule Take 30 mg by mouth 3 (three) times daily.   Yes Historical Provider, MD  fentaNYL (DURAGESIC - DOSED MCG/HR) 50 MCG/HR Place 50 mcg onto the skin every 3 (three) days.   Yes Historical Provider, MD  furosemide (LASIX) 80 MG tablet Take 80 mg by mouth daily.   Yes Historical Provider, MD  HYDROcodone-acetaminophen (NORCO) 10-325 MG per tablet Take 0.5-1 tablets by mouth every 6 (six) hours as needed.   Yes Historical Provider, MD  levothyroxine (SYNTHROID, LEVOTHROID) 137 MCG tablet Take 137 mcg by mouth daily before breakfast.   Yes Historical Provider, MD  saccharomyces boulardii (FLORASTOR) 250 MG capsule Take 250 mg by mouth 2 (two) times daily.   Yes Historical Provider, MD  saccharomyces boulardii (FLORASTOR) 250 MG capsule Take 250 mg by mouth 2 (two) times daily.   Yes Historical Provider, MD  tamsulosin (FLOMAX) 0.4 MG CAPS capsule Take 0.4 mg by mouth daily.   Yes Historical Provider, MD  warfarin (COUMADIN) 2.5 MG tablet Take 2.5-3.75 mg by mouth  See admin instructions. Take 1.5 tablets (3.75mg )  On Monday and Friday. Take 1 tablet (2.5mg ) all other days.   Yes Historical Provider, MD  Cephalexin 500 MG tablet Take 1 tablet (500 mg total) by mouth 4 (four) times daily. [id number]   Delora Fuel, MD  LORazepam (ATIVAN) 1 MG tablet Take 1 tablet (1 mg total) by mouth 3 (three) times daily as needed for anxiety. [id number]   Delora Fuel, MD  potassium chloride SA (K-DUR,KLOR-CON) 20 MEQ tablet Take 20 mEq by mouth daily.    Historical Provider, MD  traMADol (ULTRAM) 50 MG tablet Take 1 tablet (50 mg total) by mouth every 6 (six) hours as  needed. [id number]   Delora Fuel, MD   BP 123XX123 mmHg  Pulse 96  Temp(Src) 98.2 F (36.8 C) (Oral)  Resp 16  Ht 5\' 4"  (1.626 m)  Wt 163 lb (73.936 kg)  BMI 27.97 kg/m2  SpO2 100% Physical Exam  Constitutional: She is oriented to person, place, and time.  Elderly, no acute distress  HENT:  Head: Normocephalic and atraumatic.  No obvious traumatic injury, mucous membranes moist  Eyes: EOM are normal. Pupils are equal, round, and reactive to light.  Neck: Normal range of motion. Neck supple.  Tenderness palpation over the right upper cervical spine and occiput  Cardiovascular: Normal rate, regular rhythm and normal heart sounds.   No murmur heard. Pulmonary/Chest: Effort normal and breath sounds normal. No respiratory distress. She has no wheezes.  Abdominal: Soft. Bowel sounds are normal. There is no tenderness. There is no rebound.  Musculoskeletal:  Significant kyphosis noted, there is limited range of motion of the left shoulder without obvious deformity, swelling and skin tear noted over the left elbow  Neurological: She is alert and oriented to person, place, and time.  Skin: Skin is warm and dry.  Skin tear over the left elbow, no significant bleeding, to V-shaped avulsion tears, superficial approximately 4 and 3 cm respectively  Psychiatric: She has a normal mood and affect.  Nursing note and vitals reviewed.   ED Course  Procedures (including critical care time) Labs Review Labs Reviewed  CBC WITH DIFFERENTIAL/PLATELET  BASIC METABOLIC PANEL  PROTIME-INR    Imaging Review No results found. I have personally reviewed and evaluated these images and lab results as part of my medical decision-making.   EKG Interpretation None      MDM   Final diagnoses:  None    She presents following a fall. She reports losing her balance. Denies syncope, shortness breath, chest pain. Reports left shoulder and elbow pain as well as hitting her head. Imaging ordered. Basic  labwork ordered including PT/INR. She is in acute distress. Skin tears were dressed at the bedside.  Patient signed out to Dr. Jeanell Sparrow, pending imaging. If negative, patient can be ambulated and discharged home.    Merryl Hacker, MD 04/14/15 [phone]

## 2015-04-14 NOTE — Discharge Instructions (Signed)
Please follow up with your doctor.  Return to ED if worsening headache, confusion, or weakness on one side.

## 2015-04-14 NOTE — ED Notes (Signed)
Pt ambulated in hallway with walker with stand-by assist and minimal difficulty. Son at bedside and reports pt is walking per her normal.

## 2015-04-15 DIAGNOSIS — I480 Paroxysmal atrial fibrillation: Secondary | ICD-10-CM | POA: Diagnosis not present

## 2015-04-15 DIAGNOSIS — I10 Essential (primary) hypertension: Secondary | ICD-10-CM | POA: Diagnosis not present

## 2015-04-15 DIAGNOSIS — Z6829 Body mass index (BMI) 29.0-29.9, adult: Secondary | ICD-10-CM | POA: Diagnosis not present

## 2015-04-15 DIAGNOSIS — L03116 Cellulitis of left lower limb: Secondary | ICD-10-CM | POA: Diagnosis not present

## 2015-04-15 DIAGNOSIS — I8310 Varicose veins of unspecified lower extremity with inflammation: Secondary | ICD-10-CM | POA: Diagnosis not present

## 2015-04-19 DIAGNOSIS — I87319 Chronic venous hypertension (idiopathic) with ulcer of unspecified lower extremity: Secondary | ICD-10-CM | POA: Diagnosis not present

## 2015-04-19 DIAGNOSIS — L97222 Non-pressure chronic ulcer of left calf with fat layer exposed: Secondary | ICD-10-CM | POA: Diagnosis not present

## 2015-04-21 DIAGNOSIS — L97222 Non-pressure chronic ulcer of left calf with fat layer exposed: Secondary | ICD-10-CM | POA: Diagnosis not present

## 2015-04-21 DIAGNOSIS — I87319 Chronic venous hypertension (idiopathic) with ulcer of unspecified lower extremity: Secondary | ICD-10-CM | POA: Diagnosis not present

## 2015-04-21 DIAGNOSIS — M79672 Pain in left foot: Secondary | ICD-10-CM | POA: Diagnosis not present

## 2015-04-22 DIAGNOSIS — I87319 Chronic venous hypertension (idiopathic) with ulcer of unspecified lower extremity: Secondary | ICD-10-CM | POA: Diagnosis not present

## 2015-04-22 DIAGNOSIS — L97222 Non-pressure chronic ulcer of left calf with fat layer exposed: Secondary | ICD-10-CM | POA: Diagnosis not present

## 2015-04-26 DIAGNOSIS — L97222 Non-pressure chronic ulcer of left calf with fat layer exposed: Secondary | ICD-10-CM | POA: Diagnosis not present

## 2015-04-26 DIAGNOSIS — I87319 Chronic venous hypertension (idiopathic) with ulcer of unspecified lower extremity: Secondary | ICD-10-CM | POA: Diagnosis not present

## 2015-04-28 DIAGNOSIS — L97222 Non-pressure chronic ulcer of left calf with fat layer exposed: Secondary | ICD-10-CM | POA: Diagnosis not present

## 2015-04-28 DIAGNOSIS — I87319 Chronic venous hypertension (idiopathic) with ulcer of unspecified lower extremity: Secondary | ICD-10-CM | POA: Diagnosis not present

## 2015-04-30 DIAGNOSIS — L97222 Non-pressure chronic ulcer of left calf with fat layer exposed: Secondary | ICD-10-CM | POA: Diagnosis not present

## 2015-04-30 DIAGNOSIS — I87319 Chronic venous hypertension (idiopathic) with ulcer of unspecified lower extremity: Secondary | ICD-10-CM | POA: Diagnosis not present

## 2015-05-05 DIAGNOSIS — I87319 Chronic venous hypertension (idiopathic) with ulcer of unspecified lower extremity: Secondary | ICD-10-CM | POA: Diagnosis not present

## 2015-05-05 DIAGNOSIS — L97222 Non-pressure chronic ulcer of left calf with fat layer exposed: Secondary | ICD-10-CM | POA: Diagnosis not present

## 2015-05-06 DIAGNOSIS — L97222 Non-pressure chronic ulcer of left calf with fat layer exposed: Secondary | ICD-10-CM | POA: Diagnosis not present

## 2015-05-06 DIAGNOSIS — I87319 Chronic venous hypertension (idiopathic) with ulcer of unspecified lower extremity: Secondary | ICD-10-CM | POA: Diagnosis not present

## 2015-05-10 DIAGNOSIS — Z6829 Body mass index (BMI) 29.0-29.9, adult: Secondary | ICD-10-CM | POA: Diagnosis not present

## 2015-05-10 DIAGNOSIS — I10 Essential (primary) hypertension: Secondary | ICD-10-CM | POA: Diagnosis not present

## 2015-05-10 DIAGNOSIS — I480 Paroxysmal atrial fibrillation: Secondary | ICD-10-CM | POA: Diagnosis not present

## 2015-05-10 DIAGNOSIS — I872 Venous insufficiency (chronic) (peripheral): Secondary | ICD-10-CM | POA: Diagnosis not present

## 2015-05-10 DIAGNOSIS — Z7901 Long term (current) use of anticoagulants: Secondary | ICD-10-CM | POA: Diagnosis not present

## 2015-05-10 DIAGNOSIS — L03116 Cellulitis of left lower limb: Secondary | ICD-10-CM | POA: Diagnosis not present

## 2015-05-11 DIAGNOSIS — L97222 Non-pressure chronic ulcer of left calf with fat layer exposed: Secondary | ICD-10-CM | POA: Diagnosis not present

## 2015-05-11 DIAGNOSIS — I87319 Chronic venous hypertension (idiopathic) with ulcer of unspecified lower extremity: Secondary | ICD-10-CM | POA: Diagnosis not present

## 2015-05-13 DIAGNOSIS — L97222 Non-pressure chronic ulcer of left calf with fat layer exposed: Secondary | ICD-10-CM | POA: Diagnosis not present

## 2015-05-13 DIAGNOSIS — I87319 Chronic venous hypertension (idiopathic) with ulcer of unspecified lower extremity: Secondary | ICD-10-CM | POA: Diagnosis not present

## 2015-05-17 DIAGNOSIS — I87319 Chronic venous hypertension (idiopathic) with ulcer of unspecified lower extremity: Secondary | ICD-10-CM | POA: Diagnosis not present

## 2015-05-17 DIAGNOSIS — L97222 Non-pressure chronic ulcer of left calf with fat layer exposed: Secondary | ICD-10-CM | POA: Diagnosis not present

## 2015-05-21 DIAGNOSIS — M79672 Pain in left foot: Secondary | ICD-10-CM | POA: Diagnosis not present

## 2015-05-21 DIAGNOSIS — M67911 Unspecified disorder of synovium and tendon, right shoulder: Secondary | ICD-10-CM | POA: Diagnosis not present

## 2015-05-24 DIAGNOSIS — L97222 Non-pressure chronic ulcer of left calf with fat layer exposed: Secondary | ICD-10-CM | POA: Diagnosis not present

## 2015-05-24 DIAGNOSIS — I87319 Chronic venous hypertension (idiopathic) with ulcer of unspecified lower extremity: Secondary | ICD-10-CM | POA: Diagnosis not present

## 2015-05-27 DIAGNOSIS — I87319 Chronic venous hypertension (idiopathic) with ulcer of unspecified lower extremity: Secondary | ICD-10-CM | POA: Diagnosis not present

## 2015-05-27 DIAGNOSIS — L97222 Non-pressure chronic ulcer of left calf with fat layer exposed: Secondary | ICD-10-CM | POA: Diagnosis not present

## 2015-05-31 DIAGNOSIS — M19011 Primary osteoarthritis, right shoulder: Secondary | ICD-10-CM | POA: Diagnosis not present

## 2015-06-01 DIAGNOSIS — I87319 Chronic venous hypertension (idiopathic) with ulcer of unspecified lower extremity: Secondary | ICD-10-CM | POA: Diagnosis not present

## 2015-06-01 DIAGNOSIS — L97222 Non-pressure chronic ulcer of left calf with fat layer exposed: Secondary | ICD-10-CM | POA: Diagnosis not present

## 2015-06-03 DIAGNOSIS — I87319 Chronic venous hypertension (idiopathic) with ulcer of unspecified lower extremity: Secondary | ICD-10-CM | POA: Diagnosis not present

## 2015-06-03 DIAGNOSIS — L97222 Non-pressure chronic ulcer of left calf with fat layer exposed: Secondary | ICD-10-CM | POA: Diagnosis not present

## 2015-06-07 DIAGNOSIS — L97222 Non-pressure chronic ulcer of left calf with fat layer exposed: Secondary | ICD-10-CM | POA: Diagnosis not present

## 2015-06-07 DIAGNOSIS — I87319 Chronic venous hypertension (idiopathic) with ulcer of unspecified lower extremity: Secondary | ICD-10-CM | POA: Diagnosis not present

## 2015-06-09 DIAGNOSIS — I87319 Chronic venous hypertension (idiopathic) with ulcer of unspecified lower extremity: Secondary | ICD-10-CM | POA: Diagnosis not present

## 2015-06-09 DIAGNOSIS — L97222 Non-pressure chronic ulcer of left calf with fat layer exposed: Secondary | ICD-10-CM | POA: Diagnosis not present

## 2015-06-11 DIAGNOSIS — L97222 Non-pressure chronic ulcer of left calf with fat layer exposed: Secondary | ICD-10-CM | POA: Diagnosis not present

## 2015-06-11 DIAGNOSIS — I87319 Chronic venous hypertension (idiopathic) with ulcer of unspecified lower extremity: Secondary | ICD-10-CM | POA: Diagnosis not present

## 2015-06-11 DIAGNOSIS — I5032 Chronic diastolic (congestive) heart failure: Secondary | ICD-10-CM | POA: Diagnosis not present

## 2015-06-11 DIAGNOSIS — I2510 Atherosclerotic heart disease of native coronary artery without angina pectoris: Secondary | ICD-10-CM | POA: Diagnosis not present

## 2015-06-15 DIAGNOSIS — I8310 Varicose veins of unspecified lower extremity with inflammation: Secondary | ICD-10-CM | POA: Diagnosis not present

## 2015-06-15 DIAGNOSIS — Z7901 Long term (current) use of anticoagulants: Secondary | ICD-10-CM | POA: Diagnosis not present

## 2015-06-15 DIAGNOSIS — I480 Paroxysmal atrial fibrillation: Secondary | ICD-10-CM | POA: Diagnosis not present

## 2015-06-15 DIAGNOSIS — Z6828 Body mass index (BMI) 28.0-28.9, adult: Secondary | ICD-10-CM | POA: Diagnosis not present

## 2015-06-15 DIAGNOSIS — L03116 Cellulitis of left lower limb: Secondary | ICD-10-CM | POA: Diagnosis not present

## 2015-06-16 DIAGNOSIS — L97222 Non-pressure chronic ulcer of left calf with fat layer exposed: Secondary | ICD-10-CM | POA: Diagnosis not present

## 2015-06-16 DIAGNOSIS — I87319 Chronic venous hypertension (idiopathic) with ulcer of unspecified lower extremity: Secondary | ICD-10-CM | POA: Diagnosis not present

## 2015-06-18 DIAGNOSIS — L97222 Non-pressure chronic ulcer of left calf with fat layer exposed: Secondary | ICD-10-CM | POA: Diagnosis not present

## 2015-06-18 DIAGNOSIS — I87319 Chronic venous hypertension (idiopathic) with ulcer of unspecified lower extremity: Secondary | ICD-10-CM | POA: Diagnosis not present

## 2015-06-22 DIAGNOSIS — I87319 Chronic venous hypertension (idiopathic) with ulcer of unspecified lower extremity: Secondary | ICD-10-CM | POA: Diagnosis not present

## 2015-06-22 DIAGNOSIS — L97222 Non-pressure chronic ulcer of left calf with fat layer exposed: Secondary | ICD-10-CM | POA: Diagnosis not present

## 2015-06-28 DIAGNOSIS — L97222 Non-pressure chronic ulcer of left calf with fat layer exposed: Secondary | ICD-10-CM | POA: Diagnosis not present

## 2015-06-28 DIAGNOSIS — I87319 Chronic venous hypertension (idiopathic) with ulcer of unspecified lower extremity: Secondary | ICD-10-CM | POA: Diagnosis not present

## 2015-07-02 DIAGNOSIS — I87319 Chronic venous hypertension (idiopathic) with ulcer of unspecified lower extremity: Secondary | ICD-10-CM | POA: Diagnosis not present

## 2015-07-02 DIAGNOSIS — L97222 Non-pressure chronic ulcer of left calf with fat layer exposed: Secondary | ICD-10-CM | POA: Diagnosis not present

## 2015-07-05 DIAGNOSIS — I87319 Chronic venous hypertension (idiopathic) with ulcer of unspecified lower extremity: Secondary | ICD-10-CM | POA: Diagnosis not present

## 2015-07-05 DIAGNOSIS — L97222 Non-pressure chronic ulcer of left calf with fat layer exposed: Secondary | ICD-10-CM | POA: Diagnosis not present

## 2015-07-07 DIAGNOSIS — I87319 Chronic venous hypertension (idiopathic) with ulcer of unspecified lower extremity: Secondary | ICD-10-CM | POA: Diagnosis not present

## 2015-07-07 DIAGNOSIS — Z7901 Long term (current) use of anticoagulants: Secondary | ICD-10-CM | POA: Diagnosis not present

## 2015-07-07 DIAGNOSIS — I8310 Varicose veins of unspecified lower extremity with inflammation: Secondary | ICD-10-CM | POA: Diagnosis not present

## 2015-07-07 DIAGNOSIS — I480 Paroxysmal atrial fibrillation: Secondary | ICD-10-CM | POA: Diagnosis not present

## 2015-07-07 DIAGNOSIS — Z6828 Body mass index (BMI) 28.0-28.9, adult: Secondary | ICD-10-CM | POA: Diagnosis not present

## 2015-07-08 DIAGNOSIS — I87319 Chronic venous hypertension (idiopathic) with ulcer of unspecified lower extremity: Secondary | ICD-10-CM | POA: Diagnosis not present

## 2015-07-08 DIAGNOSIS — L97222 Non-pressure chronic ulcer of left calf with fat layer exposed: Secondary | ICD-10-CM | POA: Diagnosis not present

## 2015-07-13 DIAGNOSIS — I87319 Chronic venous hypertension (idiopathic) with ulcer of unspecified lower extremity: Secondary | ICD-10-CM | POA: Diagnosis not present

## 2015-07-13 DIAGNOSIS — L97222 Non-pressure chronic ulcer of left calf with fat layer exposed: Secondary | ICD-10-CM | POA: Diagnosis not present

## 2015-07-15 DIAGNOSIS — L97222 Non-pressure chronic ulcer of left calf with fat layer exposed: Secondary | ICD-10-CM | POA: Diagnosis not present

## 2015-07-15 DIAGNOSIS — I87319 Chronic venous hypertension (idiopathic) with ulcer of unspecified lower extremity: Secondary | ICD-10-CM | POA: Diagnosis not present

## 2015-07-20 DIAGNOSIS — I87319 Chronic venous hypertension (idiopathic) with ulcer of unspecified lower extremity: Secondary | ICD-10-CM | POA: Diagnosis not present

## 2015-07-20 DIAGNOSIS — L97222 Non-pressure chronic ulcer of left calf with fat layer exposed: Secondary | ICD-10-CM | POA: Diagnosis not present

## 2015-07-23 DIAGNOSIS — L97222 Non-pressure chronic ulcer of left calf with fat layer exposed: Secondary | ICD-10-CM | POA: Diagnosis not present

## 2015-07-23 DIAGNOSIS — I87319 Chronic venous hypertension (idiopathic) with ulcer of unspecified lower extremity: Secondary | ICD-10-CM | POA: Diagnosis not present

## 2015-07-26 DIAGNOSIS — I119 Hypertensive heart disease without heart failure: Secondary | ICD-10-CM | POA: Diagnosis not present

## 2015-07-26 DIAGNOSIS — Z7901 Long term (current) use of anticoagulants: Secondary | ICD-10-CM | POA: Diagnosis not present

## 2015-07-26 DIAGNOSIS — E039 Hypothyroidism, unspecified: Secondary | ICD-10-CM | POA: Diagnosis not present

## 2015-07-26 DIAGNOSIS — I340 Nonrheumatic mitral (valve) insufficiency: Secondary | ICD-10-CM | POA: Diagnosis not present

## 2015-07-26 DIAGNOSIS — I482 Chronic atrial fibrillation: Secondary | ICD-10-CM | POA: Diagnosis not present

## 2015-07-26 DIAGNOSIS — J45998 Other asthma: Secondary | ICD-10-CM | POA: Diagnosis not present

## 2015-07-26 DIAGNOSIS — E785 Hyperlipidemia, unspecified: Secondary | ICD-10-CM | POA: Diagnosis not present

## 2015-07-28 DIAGNOSIS — L97222 Non-pressure chronic ulcer of left calf with fat layer exposed: Secondary | ICD-10-CM | POA: Diagnosis not present

## 2015-07-28 DIAGNOSIS — I87319 Chronic venous hypertension (idiopathic) with ulcer of unspecified lower extremity: Secondary | ICD-10-CM | POA: Diagnosis not present

## 2015-07-30 DIAGNOSIS — L97222 Non-pressure chronic ulcer of left calf with fat layer exposed: Secondary | ICD-10-CM | POA: Diagnosis not present

## 2015-07-30 DIAGNOSIS — I87319 Chronic venous hypertension (idiopathic) with ulcer of unspecified lower extremity: Secondary | ICD-10-CM | POA: Diagnosis not present

## 2015-08-03 DIAGNOSIS — L97222 Non-pressure chronic ulcer of left calf with fat layer exposed: Secondary | ICD-10-CM | POA: Diagnosis not present

## 2015-08-03 DIAGNOSIS — I87319 Chronic venous hypertension (idiopathic) with ulcer of unspecified lower extremity: Secondary | ICD-10-CM | POA: Diagnosis not present

## 2015-08-05 DIAGNOSIS — I87319 Chronic venous hypertension (idiopathic) with ulcer of unspecified lower extremity: Secondary | ICD-10-CM | POA: Diagnosis not present

## 2015-08-05 DIAGNOSIS — L97222 Non-pressure chronic ulcer of left calf with fat layer exposed: Secondary | ICD-10-CM | POA: Diagnosis not present

## 2015-08-10 DIAGNOSIS — I87311 Chronic venous hypertension (idiopathic) with ulcer of right lower extremity: Secondary | ICD-10-CM | POA: Diagnosis not present

## 2015-08-10 DIAGNOSIS — L97222 Non-pressure chronic ulcer of left calf with fat layer exposed: Secondary | ICD-10-CM | POA: Diagnosis not present

## 2015-08-10 DIAGNOSIS — I2510 Atherosclerotic heart disease of native coronary artery without angina pectoris: Secondary | ICD-10-CM | POA: Diagnosis not present

## 2015-08-10 DIAGNOSIS — I5032 Chronic diastolic (congestive) heart failure: Secondary | ICD-10-CM | POA: Diagnosis not present

## 2015-08-12 DIAGNOSIS — I87311 Chronic venous hypertension (idiopathic) with ulcer of right lower extremity: Secondary | ICD-10-CM | POA: Diagnosis not present

## 2015-08-12 DIAGNOSIS — L97222 Non-pressure chronic ulcer of left calf with fat layer exposed: Secondary | ICD-10-CM | POA: Diagnosis not present

## 2015-08-13 ENCOUNTER — Encounter (HOSPITAL_BASED_OUTPATIENT_CLINIC_OR_DEPARTMENT_OTHER): Payer: Federal, State, Local not specified - PPO | Attending: Internal Medicine

## 2015-08-16 DIAGNOSIS — E038 Other specified hypothyroidism: Secondary | ICD-10-CM | POA: Diagnosis not present

## 2015-08-16 DIAGNOSIS — I480 Paroxysmal atrial fibrillation: Secondary | ICD-10-CM | POA: Diagnosis not present

## 2015-08-16 DIAGNOSIS — Z7901 Long term (current) use of anticoagulants: Secondary | ICD-10-CM | POA: Diagnosis not present

## 2015-08-16 DIAGNOSIS — I2510 Atherosclerotic heart disease of native coronary artery without angina pectoris: Secondary | ICD-10-CM | POA: Diagnosis not present

## 2015-08-16 DIAGNOSIS — I10 Essential (primary) hypertension: Secondary | ICD-10-CM | POA: Diagnosis not present

## 2015-08-16 DIAGNOSIS — G894 Chronic pain syndrome: Secondary | ICD-10-CM | POA: Diagnosis not present

## 2015-08-16 DIAGNOSIS — I87311 Chronic venous hypertension (idiopathic) with ulcer of right lower extremity: Secondary | ICD-10-CM | POA: Diagnosis not present

## 2015-08-16 DIAGNOSIS — I872 Venous insufficiency (chronic) (peripheral): Secondary | ICD-10-CM | POA: Diagnosis not present

## 2015-08-16 DIAGNOSIS — L97222 Non-pressure chronic ulcer of left calf with fat layer exposed: Secondary | ICD-10-CM | POA: Diagnosis not present

## 2015-08-20 DIAGNOSIS — I87311 Chronic venous hypertension (idiopathic) with ulcer of right lower extremity: Secondary | ICD-10-CM | POA: Diagnosis not present

## 2015-08-20 DIAGNOSIS — L97222 Non-pressure chronic ulcer of left calf with fat layer exposed: Secondary | ICD-10-CM | POA: Diagnosis not present

## 2015-08-23 DIAGNOSIS — L97222 Non-pressure chronic ulcer of left calf with fat layer exposed: Secondary | ICD-10-CM | POA: Diagnosis not present

## 2015-08-23 DIAGNOSIS — I87311 Chronic venous hypertension (idiopathic) with ulcer of right lower extremity: Secondary | ICD-10-CM | POA: Diagnosis not present

## 2015-08-26 DIAGNOSIS — I87311 Chronic venous hypertension (idiopathic) with ulcer of right lower extremity: Secondary | ICD-10-CM | POA: Diagnosis not present

## 2015-08-26 DIAGNOSIS — L97222 Non-pressure chronic ulcer of left calf with fat layer exposed: Secondary | ICD-10-CM | POA: Diagnosis not present

## 2015-08-30 DIAGNOSIS — M19011 Primary osteoarthritis, right shoulder: Secondary | ICD-10-CM | POA: Diagnosis not present

## 2015-08-31 DIAGNOSIS — L97222 Non-pressure chronic ulcer of left calf with fat layer exposed: Secondary | ICD-10-CM | POA: Diagnosis not present

## 2015-08-31 DIAGNOSIS — I87311 Chronic venous hypertension (idiopathic) with ulcer of right lower extremity: Secondary | ICD-10-CM | POA: Diagnosis not present

## 2015-09-02 DIAGNOSIS — L97222 Non-pressure chronic ulcer of left calf with fat layer exposed: Secondary | ICD-10-CM | POA: Diagnosis not present

## 2015-09-02 DIAGNOSIS — I87311 Chronic venous hypertension (idiopathic) with ulcer of right lower extremity: Secondary | ICD-10-CM | POA: Diagnosis not present

## 2015-09-07 DIAGNOSIS — L97222 Non-pressure chronic ulcer of left calf with fat layer exposed: Secondary | ICD-10-CM | POA: Diagnosis not present

## 2015-09-07 DIAGNOSIS — I87311 Chronic venous hypertension (idiopathic) with ulcer of right lower extremity: Secondary | ICD-10-CM | POA: Diagnosis not present

## 2015-09-17 DIAGNOSIS — I480 Paroxysmal atrial fibrillation: Secondary | ICD-10-CM | POA: Diagnosis not present

## 2015-09-17 DIAGNOSIS — Z7901 Long term (current) use of anticoagulants: Secondary | ICD-10-CM | POA: Diagnosis not present

## 2015-09-20 DIAGNOSIS — I480 Paroxysmal atrial fibrillation: Secondary | ICD-10-CM | POA: Diagnosis not present

## 2015-09-20 DIAGNOSIS — Z7901 Long term (current) use of anticoagulants: Secondary | ICD-10-CM | POA: Diagnosis not present

## 2015-10-04 DIAGNOSIS — Z7901 Long term (current) use of anticoagulants: Secondary | ICD-10-CM | POA: Diagnosis not present

## 2015-10-04 DIAGNOSIS — I480 Paroxysmal atrial fibrillation: Secondary | ICD-10-CM | POA: Diagnosis not present

## 2015-10-04 DIAGNOSIS — Z23 Encounter for immunization: Secondary | ICD-10-CM | POA: Diagnosis not present

## 2015-10-07 ENCOUNTER — Encounter (HOSPITAL_BASED_OUTPATIENT_CLINIC_OR_DEPARTMENT_OTHER): Payer: Medicare Other | Attending: Internal Medicine

## 2015-10-07 DIAGNOSIS — M720 Palmar fascial fibromatosis [Dupuytren]: Secondary | ICD-10-CM | POA: Insufficient documentation

## 2015-10-07 DIAGNOSIS — I10 Essential (primary) hypertension: Secondary | ICD-10-CM | POA: Insufficient documentation

## 2015-10-07 DIAGNOSIS — M7501 Adhesive capsulitis of right shoulder: Secondary | ICD-10-CM | POA: Diagnosis not present

## 2015-10-07 DIAGNOSIS — I87331 Chronic venous hypertension (idiopathic) with ulcer and inflammation of right lower extremity: Secondary | ICD-10-CM | POA: Diagnosis not present

## 2015-10-07 DIAGNOSIS — I890 Lymphedema, not elsewhere classified: Secondary | ICD-10-CM | POA: Diagnosis not present

## 2015-10-07 DIAGNOSIS — Z96652 Presence of left artificial knee joint: Secondary | ICD-10-CM | POA: Insufficient documentation

## 2015-10-07 DIAGNOSIS — L97811 Non-pressure chronic ulcer of other part of right lower leg limited to breakdown of skin: Secondary | ICD-10-CM | POA: Insufficient documentation

## 2015-10-07 DIAGNOSIS — S81801A Unspecified open wound, right lower leg, initial encounter: Secondary | ICD-10-CM | POA: Diagnosis not present

## 2015-10-07 DIAGNOSIS — I2510 Atherosclerotic heart disease of native coronary artery without angina pectoris: Secondary | ICD-10-CM | POA: Insufficient documentation

## 2015-10-07 DIAGNOSIS — M7502 Adhesive capsulitis of left shoulder: Secondary | ICD-10-CM | POA: Insufficient documentation

## 2015-10-14 DIAGNOSIS — M7501 Adhesive capsulitis of right shoulder: Secondary | ICD-10-CM | POA: Diagnosis not present

## 2015-10-14 DIAGNOSIS — I890 Lymphedema, not elsewhere classified: Secondary | ICD-10-CM | POA: Diagnosis not present

## 2015-10-14 DIAGNOSIS — L97811 Non-pressure chronic ulcer of other part of right lower leg limited to breakdown of skin: Secondary | ICD-10-CM | POA: Diagnosis not present

## 2015-10-14 DIAGNOSIS — I87331 Chronic venous hypertension (idiopathic) with ulcer and inflammation of right lower extremity: Secondary | ICD-10-CM | POA: Diagnosis not present

## 2015-10-14 DIAGNOSIS — L97211 Non-pressure chronic ulcer of right calf limited to breakdown of skin: Secondary | ICD-10-CM | POA: Diagnosis not present

## 2015-10-14 DIAGNOSIS — I87333 Chronic venous hypertension (idiopathic) with ulcer and inflammation of bilateral lower extremity: Secondary | ICD-10-CM | POA: Diagnosis not present

## 2015-10-14 DIAGNOSIS — M7502 Adhesive capsulitis of left shoulder: Secondary | ICD-10-CM | POA: Diagnosis not present

## 2015-10-14 DIAGNOSIS — M720 Palmar fascial fibromatosis [Dupuytren]: Secondary | ICD-10-CM | POA: Diagnosis not present

## 2015-10-21 ENCOUNTER — Encounter (HOSPITAL_BASED_OUTPATIENT_CLINIC_OR_DEPARTMENT_OTHER): Payer: Medicare Other | Attending: Internal Medicine

## 2015-10-21 DIAGNOSIS — I87331 Chronic venous hypertension (idiopathic) with ulcer and inflammation of right lower extremity: Secondary | ICD-10-CM | POA: Insufficient documentation

## 2015-10-21 DIAGNOSIS — I87333 Chronic venous hypertension (idiopathic) with ulcer and inflammation of bilateral lower extremity: Secondary | ICD-10-CM | POA: Diagnosis not present

## 2015-10-21 DIAGNOSIS — L97811 Non-pressure chronic ulcer of other part of right lower leg limited to breakdown of skin: Secondary | ICD-10-CM | POA: Insufficient documentation

## 2015-10-21 DIAGNOSIS — I890 Lymphedema, not elsewhere classified: Secondary | ICD-10-CM | POA: Insufficient documentation

## 2015-10-21 DIAGNOSIS — L97211 Non-pressure chronic ulcer of right calf limited to breakdown of skin: Secondary | ICD-10-CM | POA: Diagnosis not present

## 2015-10-28 DIAGNOSIS — I87333 Chronic venous hypertension (idiopathic) with ulcer and inflammation of bilateral lower extremity: Secondary | ICD-10-CM | POA: Diagnosis not present

## 2015-10-28 DIAGNOSIS — I890 Lymphedema, not elsewhere classified: Secondary | ICD-10-CM | POA: Diagnosis not present

## 2015-10-28 DIAGNOSIS — I87331 Chronic venous hypertension (idiopathic) with ulcer and inflammation of right lower extremity: Secondary | ICD-10-CM | POA: Diagnosis not present

## 2015-10-28 DIAGNOSIS — L97211 Non-pressure chronic ulcer of right calf limited to breakdown of skin: Secondary | ICD-10-CM | POA: Diagnosis not present

## 2015-11-01 DIAGNOSIS — Z7901 Long term (current) use of anticoagulants: Secondary | ICD-10-CM | POA: Diagnosis not present

## 2015-11-01 DIAGNOSIS — R300 Dysuria: Secondary | ICD-10-CM | POA: Diagnosis not present

## 2015-11-01 DIAGNOSIS — I480 Paroxysmal atrial fibrillation: Secondary | ICD-10-CM | POA: Diagnosis not present

## 2015-11-01 DIAGNOSIS — N390 Urinary tract infection, site not specified: Secondary | ICD-10-CM | POA: Diagnosis not present

## 2015-11-04 DIAGNOSIS — S81801A Unspecified open wound, right lower leg, initial encounter: Secondary | ICD-10-CM | POA: Diagnosis not present

## 2015-11-04 DIAGNOSIS — I87331 Chronic venous hypertension (idiopathic) with ulcer and inflammation of right lower extremity: Secondary | ICD-10-CM | POA: Diagnosis not present

## 2015-11-11 DIAGNOSIS — R600 Localized edema: Secondary | ICD-10-CM | POA: Diagnosis not present

## 2015-11-11 DIAGNOSIS — S81801A Unspecified open wound, right lower leg, initial encounter: Secondary | ICD-10-CM | POA: Diagnosis not present

## 2015-11-11 DIAGNOSIS — I87331 Chronic venous hypertension (idiopathic) with ulcer and inflammation of right lower extremity: Secondary | ICD-10-CM | POA: Diagnosis not present

## 2015-11-18 ENCOUNTER — Encounter (HOSPITAL_BASED_OUTPATIENT_CLINIC_OR_DEPARTMENT_OTHER): Payer: Medicare Other | Attending: Internal Medicine

## 2015-11-18 DIAGNOSIS — Z872 Personal history of diseases of the skin and subcutaneous tissue: Secondary | ICD-10-CM | POA: Diagnosis not present

## 2015-11-18 DIAGNOSIS — Z09 Encounter for follow-up examination after completed treatment for conditions other than malignant neoplasm: Secondary | ICD-10-CM | POA: Diagnosis not present

## 2015-11-18 DIAGNOSIS — I890 Lymphedema, not elsewhere classified: Secondary | ICD-10-CM | POA: Diagnosis not present

## 2015-11-18 DIAGNOSIS — I87333 Chronic venous hypertension (idiopathic) with ulcer and inflammation of bilateral lower extremity: Secondary | ICD-10-CM | POA: Diagnosis not present

## 2015-11-21 ENCOUNTER — Encounter (HOSPITAL_COMMUNITY): Payer: Self-pay

## 2015-11-21 ENCOUNTER — Observation Stay (HOSPITAL_COMMUNITY)
Admission: EM | Admit: 2015-11-21 | Discharge: 2015-11-23 | Disposition: A | Discharge status: Home / Self Care | Payer: Medicare Other | Attending: Internal Medicine | Admitting: Internal Medicine

## 2015-11-21 DIAGNOSIS — R531 Weakness: Secondary | ICD-10-CM

## 2015-11-21 DIAGNOSIS — E860 Dehydration: Secondary | ICD-10-CM

## 2015-11-21 DIAGNOSIS — R600 Localized edema: Secondary | ICD-10-CM | POA: Insufficient documentation

## 2015-11-21 DIAGNOSIS — I10 Essential (primary) hypertension: Secondary | ICD-10-CM | POA: Diagnosis not present

## 2015-11-21 DIAGNOSIS — Z9660 Presence of unspecified orthopedic joint implant: Secondary | ICD-10-CM | POA: Diagnosis not present

## 2015-11-21 DIAGNOSIS — K5900 Constipation, unspecified: Secondary | ICD-10-CM

## 2015-11-21 DIAGNOSIS — E876 Hypokalemia: Secondary | ICD-10-CM

## 2015-11-21 DIAGNOSIS — Z7901 Long term (current) use of anticoagulants: Secondary | ICD-10-CM | POA: Insufficient documentation

## 2015-11-21 DIAGNOSIS — Z87891 Personal history of nicotine dependence: Secondary | ICD-10-CM | POA: Diagnosis not present

## 2015-11-21 DIAGNOSIS — I2510 Atherosclerotic heart disease of native coronary artery without angina pectoris: Secondary | ICD-10-CM | POA: Diagnosis not present

## 2015-11-21 DIAGNOSIS — Z79899 Other long term (current) drug therapy: Secondary | ICD-10-CM | POA: Diagnosis not present

## 2015-11-21 DIAGNOSIS — F410 Panic disorder [episodic paroxysmal anxiety] without agoraphobia: Secondary | ICD-10-CM

## 2015-11-21 DIAGNOSIS — N179 Acute kidney failure, unspecified: Secondary | ICD-10-CM | POA: Diagnosis not present

## 2015-11-21 DIAGNOSIS — I708 Atherosclerosis of other arteries: Secondary | ICD-10-CM | POA: Insufficient documentation

## 2015-11-21 DIAGNOSIS — E039 Hypothyroidism, unspecified: Secondary | ICD-10-CM | POA: Insufficient documentation

## 2015-11-21 DIAGNOSIS — R197 Diarrhea, unspecified: Secondary | ICD-10-CM | POA: Diagnosis not present

## 2015-11-21 DIAGNOSIS — G2581 Restless legs syndrome: Secondary | ICD-10-CM | POA: Insufficient documentation

## 2015-11-21 DIAGNOSIS — Z7982 Long term (current) use of aspirin: Secondary | ICD-10-CM | POA: Insufficient documentation

## 2015-11-21 DIAGNOSIS — K529 Noninfective gastroenteritis and colitis, unspecified: Secondary | ICD-10-CM | POA: Diagnosis not present

## 2015-11-21 LAB — COMPREHENSIVE METABOLIC PANEL
ALK PHOS: 175 U/L — AB (ref 38–126)
ALT: 12 U/L — ABNORMAL LOW (ref 14–54)
ANION GAP: 10 (ref 5–15)
AST: 23 U/L (ref 15–41)
Albumin: 3.7 g/dL (ref 3.5–5.0)
BILIRUBIN TOTAL: 1.2 mg/dL (ref 0.3–1.2)
BUN: 20 mg/dL (ref 6–20)
CALCIUM: 10 mg/dL (ref 8.9–10.3)
CO2: 31 mmol/L (ref 22–32)
CREATININE: 1.2 mg/dL — AB (ref 0.44–1.00)
Chloride: 96 mmol/L — ABNORMAL LOW (ref 101–111)
GFR calc non Af Amer: 40 mL/min — ABNORMAL LOW (ref >60)
GFR, EST AFRICAN AMERICAN: 46 mL/min — AB (ref >60)
Glucose, Bld: 122 mg/dL — ABNORMAL HIGH (ref 65–99)
Potassium: 2.9 mmol/L — ABNORMAL LOW (ref 3.5–5.1)
Sodium: 137 mmol/L (ref 135–145)
TOTAL PROTEIN: 6.3 g/dL — AB (ref 6.5–8.1)

## 2015-11-21 LAB — CBC
HCT: 38.1 % (ref 36.0–46.0)
HEMOGLOBIN: 12.3 g/dL (ref 12.0–15.0)
MCH: 27.5 pg (ref 26.0–34.0)
MCHC: 32.3 g/dL (ref 30.0–36.0)
MCV: 85 fL (ref 78.0–100.0)
PLATELETS: 208 10*3/uL (ref 150–400)
RBC: 4.48 MIL/uL (ref 3.87–5.11)
RDW: 16.2 % — ABNORMAL HIGH (ref 11.5–15.5)
WBC: 6.1 10*3/uL (ref 4.0–10.5)

## 2015-11-21 LAB — PROTIME-INR
INR: 1.84
PROTHROMBIN TIME: 21.5 s — AB (ref 11.4–15.2)

## 2015-11-21 LAB — LIPASE, BLOOD: Lipase: 23 U/L (ref 11–51)

## 2015-11-21 LAB — MAGNESIUM: MAGNESIUM: 1.4 mg/dL — AB (ref 1.7–2.4)

## 2015-11-21 MED ORDER — ONDANSETRON HCL 4 MG PO TABS: 4.0000 mg | ORAL_TABLET | Freq: Four times a day (QID) | ORAL | Status: DC | PRN

## 2015-11-21 MED ORDER — POTASSIUM CHLORIDE 10 MEQ/100ML IV SOLN
10.0000 meq | INTRAVENOUS | Status: AC
  Administered 2015-11-21 (×2): 10 meq via INTRAVENOUS
  Filled 2015-11-21: qty 100

## 2015-11-21 MED ORDER — SODIUM CHLORIDE 0.9% FLUSH: 3.0000 mL | Freq: Two times a day (BID) | INTRAVENOUS | Status: DC

## 2015-11-21 MED ORDER — BISACODYL 10 MG RE SUPP: 10.0000 mg | Freq: Every day | RECTAL | Status: DC | PRN

## 2015-11-21 MED ORDER — MAGNESIUM CITRATE PO SOLN: 1.0000 | Freq: Once | ORAL | Status: DC | PRN

## 2015-11-21 MED ORDER — SODIUM CHLORIDE 0.9 % IV SOLN
INTRAVENOUS | Status: DC
  Administered 2015-11-22: 02:00:00 via INTRAVENOUS

## 2015-11-21 MED ORDER — SODIUM CHLORIDE 0.9 % IV SOLN
INTRAVENOUS | Status: AC
  Administered 2015-11-21: 17:00:00 via INTRAVENOUS

## 2015-11-21 MED ORDER — SACCHAROMYCES BOULARDII 250 MG PO CAPS
250.0000 mg | ORAL_CAPSULE | Freq: Two times a day (BID) | ORAL | Status: DC
  Administered 2015-11-21 – 2015-11-23 (×4): 250 mg via ORAL
  Filled 2015-11-21 (×4): qty 1

## 2015-11-21 MED ORDER — ONDANSETRON HCL 4 MG/2ML IJ SOLN: 4.0000 mg | Freq: Four times a day (QID) | INTRAMUSCULAR | Status: DC | PRN

## 2015-11-21 MED ORDER — SODIUM CHLORIDE 0.9 % IV BOLUS (SEPSIS)
1000.0000 mL | Freq: Once | INTRAVENOUS | Status: AC
Start: 2015-11-21 — End: 2015-11-21
  Administered 2015-11-21: 1000 mL via INTRAVENOUS

## 2015-11-21 MED ORDER — ALPRAZOLAM 0.25 MG PO TABS
1.0000 mg | ORAL_TABLET | ORAL | Status: DC
  Administered 2015-11-22: 1 mg via ORAL
  Filled 2015-11-21: qty 4

## 2015-11-21 MED ORDER — LORAZEPAM 1 MG PO TABS
1.0000 mg | ORAL_TABLET | Freq: Three times a day (TID) | ORAL | Status: DC | PRN
  Administered 2015-11-21 – 2015-11-23 (×2): 1 mg via ORAL
  Filled 2015-11-21 (×2): qty 1

## 2015-11-21 MED ORDER — DULOXETINE HCL 30 MG PO CPEP
30.0000 mg | ORAL_CAPSULE | Freq: Three times a day (TID) | ORAL | Status: DC
  Administered 2015-11-21 – 2015-11-23 (×6): 30 mg via ORAL
  Filled 2015-11-21 (×7): qty 1

## 2015-11-21 MED ORDER — POTASSIUM CHLORIDE 10 MEQ/100ML IV SOLN
10.0000 meq | INTRAVENOUS | Status: AC
  Administered 2015-11-21: 10 meq via INTRAVENOUS
  Filled 2015-11-21 (×2): qty 100

## 2015-11-21 MED ORDER — ASPIRIN EC 81 MG PO TBEC
81.0000 mg | DELAYED_RELEASE_TABLET | Freq: Every day | ORAL | Status: DC
  Administered 2015-11-21 – 2015-11-23 (×3): 81 mg via ORAL
  Filled 2015-11-21 (×3): qty 1

## 2015-11-21 MED ORDER — FUROSEMIDE 80 MG PO TABS
80.0000 mg | ORAL_TABLET | Freq: Every day | ORAL | Status: DC
  Administered 2015-11-22 – 2015-11-23 (×2): 80 mg via ORAL
  Filled 2015-11-21 (×3): qty 1

## 2015-11-21 MED ORDER — ACETAMINOPHEN 325 MG PO TABS
650.0000 mg | ORAL_TABLET | Freq: Four times a day (QID) | ORAL | Status: DC | PRN
  Administered 2015-11-22: 650 mg via ORAL
  Filled 2015-11-21: qty 2

## 2015-11-21 MED ORDER — HEPARIN SODIUM (PORCINE) 5000 UNIT/ML IJ SOLN
5000.0000 [IU] | Freq: Three times a day (TID) | INTRAMUSCULAR | Status: DC
  Administered 2015-11-21 – 2015-11-23 (×6): 5000 [IU] via SUBCUTANEOUS
  Filled 2015-11-21 (×6): qty 1

## 2015-11-21 MED ORDER — TAMSULOSIN HCL 0.4 MG PO CAPS
0.4000 mg | ORAL_CAPSULE | Freq: Every day | ORAL | Status: DC
  Administered 2015-11-21 – 2015-11-23 (×3): 0.4 mg via ORAL
  Filled 2015-11-21 (×3): qty 1

## 2015-11-21 MED ORDER — LEVOTHYROXINE SODIUM 137 MCG PO TABS
137.0000 ug | ORAL_TABLET | Freq: Every day | ORAL | Status: DC
  Administered 2015-11-22 – 2015-11-23 (×2): 137 ug via ORAL
  Filled 2015-11-21 (×2): qty 1

## 2015-11-21 MED ORDER — ACETAMINOPHEN 650 MG RE SUPP: 650.0000 mg | Freq: Four times a day (QID) | RECTAL | Status: DC | PRN

## 2015-11-21 MED ORDER — KETOROLAC TROMETHAMINE 15 MG/ML IJ SOLN
15.0000 mg | Freq: Four times a day (QID) | INTRAMUSCULAR | Status: DC | PRN
  Administered 2015-11-21 – 2015-11-22 (×2): 15 mg via INTRAVENOUS
  Filled 2015-11-21 (×2): qty 1

## 2015-11-21 MED ORDER — TRAZODONE HCL 50 MG PO TABS
25.0000 mg | ORAL_TABLET | Freq: Every evening | ORAL | Status: DC | PRN
  Administered 2015-11-22: 25 mg via ORAL
  Filled 2015-11-21: qty 1

## 2015-11-21 MED ORDER — SENNOSIDES-DOCUSATE SODIUM 8.6-50 MG PO TABS: 1.0000 | ORAL_TABLET | Freq: Every evening | ORAL | Status: DC | PRN

## 2015-11-21 MED ORDER — DILTIAZEM HCL ER COATED BEADS 240 MG PO CP24
240.0000 mg | ORAL_CAPSULE | Freq: Every day | ORAL | Status: DC
  Administered 2015-11-21 – 2015-11-23 (×3): 240 mg via ORAL
  Filled 2015-11-21 (×3): qty 1

## 2015-11-21 NOTE — ED Triage Notes (Signed)
Patient complains of increased weakness and diarrhea for greater than the past week. Complains of generalized abdominal pain with same. No nausea, no vomiting

## 2015-11-21 NOTE — ED Notes (Signed)
2nd bag of Potassium infusing

## 2015-11-21 NOTE — ED Provider Notes (Signed)
[facility] DEPT Provider Note   CSN: [id number] Arrival date & time: 11/21/15  1332     History   Chief Complaint Chief Complaint  Patient presents with  . Diarrhea  . Weakness    HPI Leah [name] is a 82 y.o. female.  Patient  w hx htn cad, c/o generalized weakness in the past week, with intermittent multiple loose/watery bms. Very poor appetite w poor po intake in past week. No known ill contacts. Nausea. No vomiting. Denies abd pain. States had been on abx in past month for uti, not currently. Denies fever or chills. No dysuria or gu c/o. Feels generally weak/lightheaded when stands.    The history is provided by the patient and a relative.  Diarrhea   Pertinent negatives include no abdominal pain, no vomiting and no headaches.  Weakness  Pertinent negatives include no shortness of breath, no chest pain, no vomiting, no confusion and no headaches.    Past Medical History:  Diagnosis Date  . Coronary artery disease   . Hypertension   . Hypothyroidism   . Panic attacks   . RLS (restless legs syndrome)   . Thyroid disease   . Vitamin D deficiency     There are no active problems to display for this patient.   Past Surgical History:  Procedure Laterality Date  . JOINT REPLACEMENT      OB History    No data available       Home Medications    Prior to Admission medications   Medication Sig Start Date End Date Taking? Authorizing Provider  aspirin EC 81 MG tablet Take 81 mg by mouth daily.   Yes Historical Provider, MD  ALPRAZolam Duanne Moron) 1 MG tablet Take 1-2 mg by mouth See admin instructions. Take 1 tablet in the morning and 2 tablets at bedtime.    Historical Provider, MD  Cephalexin 500 MG tablet Take 1 tablet (500 mg total) by mouth 4 (four) times daily. Q000111Q   Delora Fuel, MD  diltiazem (CARDIZEM CD) 240 MG 24 hr capsule Take 240 mg by mouth daily.    Historical Provider, MD  DULoxetine (CYMBALTA) 30 MG capsule Take 30 mg by mouth 3 (three)  times daily.    Historical Provider, MD  fentaNYL (DURAGESIC - DOSED MCG/HR) 50 MCG/HR Place 50 mcg onto the skin every 3 (three) days.    Historical Provider, MD  furosemide (LASIX) 80 MG tablet Take 80 mg by mouth daily.    Historical Provider, MD  HYDROcodone-acetaminophen (NORCO) 10-325 MG per tablet Take 0.5-1 tablets by mouth every 6 (six) hours as needed.    Historical Provider, MD  levothyroxine (SYNTHROID, LEVOTHROID) 137 MCG tablet Take 137 mcg by mouth daily before breakfast.    Historical Provider, MD  LORazepam (ATIVAN) 1 MG tablet Take 1 tablet (1 mg total) by mouth 3 (three) times daily as needed for anxiety. Q000111Q   Delora Fuel, MD  potassium chloride SA (K-DUR,KLOR-CON) 20 MEQ tablet Take 20 mEq by mouth daily.    Historical Provider, MD  saccharomyces boulardii (FLORASTOR) 250 MG capsule Take 250 mg by mouth 2 (two) times daily.    Historical Provider, MD  saccharomyces boulardii (FLORASTOR) 250 MG capsule Take 250 mg by mouth 2 (two) times daily.    Historical Provider, MD  tamsulosin (FLOMAX) 0.4 MG CAPS capsule Take 0.4 mg by mouth daily.    Historical Provider, MD  traMADol (ULTRAM) 50 MG tablet Take 1 tablet (50 mg total) by mouth every  6 (six) hours as needed. Q000111Q   Delora Fuel, MD  warfarin (COUMADIN) 2.5 MG tablet Take 2.5-3.75 mg by mouth See admin instructions. Take 1.5 tablets (3.75mg )  On Monday and Friday. Take 1 tablet (2.5mg ) all other days.    Historical Provider, MD    Family History No family history on file.  Social History Social History  Substance Use Topics  . Smoking status: Former Smoker    Quit date: 02/13/1966  . Smokeless tobacco: Never Used  . Alcohol use No     Allergies   Codeine   Review of Systems Review of Systems  Constitutional: Negative for fever.  HENT: Negative for sore throat.   Eyes: Negative for redness.  Respiratory: Negative for shortness of breath.   Cardiovascular: Negative for chest pain.  Gastrointestinal:  Positive for diarrhea. Negative for abdominal pain and vomiting.  Genitourinary: Negative for flank pain.  Musculoskeletal: Negative for back pain and neck pain.  Skin: Negative for rash.  Neurological: Positive for weakness. Negative for headaches.  Hematological: Does not bruise/bleed easily.  Psychiatric/Behavioral: Negative for confusion.     Physical Exam Updated Vital Signs BP 146/87   Pulse 65   Temp 98.6 F (37 C) (Oral)   Resp 16   Ht 5' (1.524 m)   Wt 68.9 kg   SpO2 98%   BMI 29.69 kg/m   Physical Exam  Constitutional: She appears well-developed and well-nourished. No distress.  HENT:  Dry MM.   Eyes: Conjunctivae are normal. No scleral icterus.  Neck: Neck supple. No tracheal deviation present.  Cardiovascular: Normal rate, regular rhythm, normal heart sounds and intact distal pulses.   Pulmonary/Chest: Effort normal and breath sounds normal. No respiratory distress.  Abdominal: Soft. Normal appearance and bowel sounds are normal. She exhibits no distension and no mass. There is no tenderness. There is no rebound and no guarding.  Genitourinary:  Genitourinary Comments: No cva tenderness  Musculoskeletal: She exhibits edema.  Bilateral leg edema, L >> R, which patient/family indicates is baseline for her, since remote hx left knee surgery. Left leg weeping clear fluid.   Neurological: She is alert.  Skin: Skin is warm and dry. No rash noted.  Psychiatric: She has a normal mood and affect.  Nursing note and vitals reviewed.    ED Treatments / Results  Labs (all labs ordered are listed, but only abnormal results are displayed) Results for orders placed or performed during the hospital encounter of 11/21/15  Lipase, blood  Result Value Ref Range   Lipase 23 11 - 51 U/L  Comprehensive metabolic panel  Result Value Ref Range   Sodium 137 135 - 145 mmol/L   Potassium 2.9 (L) 3.5 - 5.1 mmol/L   Chloride 96 (L) 101 - 111 mmol/L   CO2 31 22 - 32 mmol/L    Glucose, Bld 122 (H) 65 - 99 mg/dL   BUN 20 6 - 20 mg/dL   Creatinine, Ser 1.20 (H) 0.44 - 1.00 mg/dL   Calcium 10.0 8.9 - 10.3 mg/dL   Total Protein 6.3 (L) 6.5 - 8.1 g/dL   Albumin 3.7 3.5 - 5.0 g/dL   AST 23 15 - 41 U/L   ALT 12 (L) 14 - 54 U/L   Alkaline Phosphatase 175 (H) 38 - 126 U/L   Total Bilirubin 1.2 0.3 - 1.2 mg/dL   GFR calc non Af Amer 40 (L) >60 mL/min   GFR calc Af Amer 46 (L) >60 mL/min   Anion gap 10 5 -  15  CBC  Result Value Ref Range   WBC 6.1 4.0 - 10.5 K/uL   RBC 4.48 3.87 - 5.11 MIL/uL   Hemoglobin 12.3 12.0 - 15.0 g/dL   HCT 38.1 36.0 - 46.0 %   MCV 85.0 78.0 - 100.0 fL   MCH 27.5 26.0 - 34.0 pg   MCHC 32.3 30.0 - 36.0 g/dL   RDW 16.2 (H) 11.5 - 15.5 %   Platelets 208 150 - 400 K/uL  Magnesium  Result Value Ref Range   Magnesium 1.4 (L) 1.7 - 2.4 mg/dL  Protime-INR  Result Value Ref Range   Prothrombin Time 21.5 (H) 11.4 - 15.2 seconds   INR 1.84     EKG  EKG Interpretation None       Radiology No results found.  Procedures Procedures (including critical care time)  Medications Ordered in ED Medications  potassium chloride 10 mEq in 100 mL IVPB (not administered)  sodium chloride 0.9 % bolus 1,000 mL (not administered)     Initial Impression / Assessment and Plan / ED Course  I have reviewed the triage vital signs and the nursing notes.  Pertinent labs & imaging results that were available during my care of the patient were reviewed by me and considered in my medical decision making (see chart for details).  Clinical Course    Iv ns bolus.  Reviewed nursing notes and prior charts for additional history.   In comparison to prior labs, AKI likely reflecting dehydration. K low, Mg added to labs, kcl iv and po.   Given dehydration, low k, weakness, will admit.   Hospitalists consulted for admission.    Final Clinical Impressions(s) / ED Diagnoses   Final diagnoses:  None    New Prescriptions New Prescriptions   No  medications on file     Lajean Saver, MD 11/21/15 1612

## 2015-11-21 NOTE — Progress Notes (Signed)
1745 Received pt from ED via stretcher. Pt is A&O x4. No loose stools today per pt, incontinent of urine.  Redness and swelling noted to BLE, right leg is bigger than the left. Blisters noted to right calf. Pt was using a compression stockings, removed.

## 2015-11-21 NOTE — ED Notes (Signed)
Called lab to add on INR and mag

## 2015-11-21 NOTE — H&P (Signed)
History and Physical    Leah [name] [id number] DOB: 1927-07-03 DOA: 11/21/2015   PCP: Tivis Ringer, MD   Patient coming from:  Home    Chief Complaint: Diarrhea and Generalized Weakness   HPI: Leah [name] is a 82 y.o. female with recent UTI treated with Keflex, discontinued due to diarrhea not completing the course, now presenting with diarrhea since this morning, watery, after the use of laxatives on 10/7, after not having a bowel movement for 4-5 days.  She denies any blood in the stools. She had between 3 and 4 bowel movements, with no recurrence during the hospitalization. She has had very poor appetite over the last week, with decreased liquid intake. She feels very dry. She also reports nausea, and intermittent cramping due to flatulence. She denies abdominal pain. She denies any sick contacts or food poisoning. She denies any fever or chills. No dysuria at this time, or gross hematuria. She denies any dizziness, presyncope or syncope. Denies any falls . She denies any chest pain or palpitations. She has chronic lower extremity edema, [facility], now with chronic wraps placement at home. No confusion has been reported.  ED Course:  BP 143/99   Pulse 95   Temp 98.6 F (37 C) (Oral)   Resp 16   Ht 5' (1.524 m)   Wt 68.9 kg (152 lb)   SpO2 94%   BMI 29.69 kg/m    remarkable for potassium 2.9  creatinine 1.2  CBC is unremarkable with WBC 6.1, Hb 12.3 and Plt 208  urinalysis pending  C. difficile pending. Received to bolus of 1 L of normal saline,  KCl 10 mEq times 2.  Review of Systems: As per HPI otherwise 10 point review of systems negative  Past Medical History:  Diagnosis Date  . Coronary artery disease   . Hypertension   . Hypothyroidism   . Panic attacks   . RLS (restless legs syndrome)   . Thyroid disease   . Vitamin D deficiency     Past Surgical History:  Procedure Laterality Date  . JOINT REPLACEMENT       Social History Social History   Social History  . Marital status: Widowed    Spouse name: N/A  . Number of children: N/A  . Years of education: N/A   Occupational History  . Not on file.   Social History Main Topics  . Smoking status: Former Smoker    Quit date: 02/13/1966  . Smokeless tobacco: Never Used  . Alcohol use No  . Drug use: No  . Sexual activity: Not on file   Other Topics Concern  . Not on file   Social History Narrative  . No narrative on file     Allergies  Allergen Reactions  . Codeine Other (See Comments)    nervous    No family history on file.    Prior to Admission medications   Medication Sig Start Date End Date Taking? Authorizing Provider  aspirin EC 81 MG tablet Take 81 mg by mouth daily.   Yes Historical Provider, MD  ALPRAZolam Duanne Moron) 1 MG tablet Take 1-2 mg by mouth See admin instructions. Take 1 tablet in the morning and 2 tablets at bedtime.    Historical Provider, MD  Cephalexin 500 MG tablet Take 1 tablet (500 mg total) by mouth 4 (four) times daily. Q000111Q   Delora Fuel, MD  diltiazem (CARDIZEM CD) 240 MG 24 hr capsule Take 240 mg  by mouth daily.    Historical Provider, MD  DULoxetine (CYMBALTA) 30 MG capsule Take 30 mg by mouth 3 (three) times daily.    Historical Provider, MD  fentaNYL (DURAGESIC - DOSED MCG/HR) 50 MCG/HR Place 50 mcg onto the skin every 3 (three) days.    Historical Provider, MD  furosemide (LASIX) 80 MG tablet Take 80 mg by mouth daily.    Historical Provider, MD  HYDROcodone-acetaminophen (NORCO) 10-325 MG per tablet Take 0.5-1 tablets by mouth every 6 (six) hours as needed.    Historical Provider, MD  levothyroxine (SYNTHROID, LEVOTHROID) 137 MCG tablet Take 137 mcg by mouth daily before breakfast.    Historical Provider, MD  LORazepam (ATIVAN) 1 MG tablet Take 1 tablet (1 mg total) by mouth 3 (three) times daily as needed for anxiety. Q000111Q   Delora Fuel, MD  potassium chloride SA (K-DUR,KLOR-CON) 20 MEQ  tablet Take 20 mEq by mouth daily.    Historical Provider, MD  saccharomyces boulardii (FLORASTOR) 250 MG capsule Take 250 mg by mouth 2 (two) times daily.    Historical Provider, MD  saccharomyces boulardii (FLORASTOR) 250 MG capsule Take 250 mg by mouth 2 (two) times daily.    Historical Provider, MD  tamsulosin (FLOMAX) 0.4 MG CAPS capsule Take 0.4 mg by mouth daily.    Historical Provider, MD  traMADol (ULTRAM) 50 MG tablet Take 1 tablet (50 mg total) by mouth every 6 (six) hours as needed. Q000111Q   Delora Fuel, MD  warfarin (COUMADIN) 2.5 MG tablet Take 2.5-3.75 mg by mouth See admin instructions. Take 1.5 tablets (3.75mg )  On Monday and Friday. Take 1 tablet (2.5mg ) all other days.    Historical Provider, MD    Physical Exam:    Vitals:   11/21/15 1500 11/21/15 1501 11/21/15 1530 11/21/15 1545  BP: 146/87  143/99   Pulse:  65  95  Resp:      Temp:      TempSrc:      SpO2:  98%  94%  Weight:      Height:           Constitutional: NAD, calm, weak appearing  Vitals:   11/21/15 1500 11/21/15 1501 11/21/15 1530 11/21/15 1545  BP: 146/87  143/99   Pulse:  65  95  Resp:      Temp:      TempSrc:      SpO2:  98%  94%  Weight:      Height:       Eyes: PERRL, lids and conjunctivae normal ENMT: Mucous membranes are moist. Posterior pharynx clear of any exudate or lesions.Normal dentition.  Neck: normal, supple, no masses, no thyromegaly Respiratory: clear to auscultation bilaterally, no wheezing, no crackles. Normal respiratory effort. No accessory muscle use.  Cardiovascular: Regular rate and rhythm, 2/6 syst murmur  murmurs / rubs / gallops.  Chronic pitting, weeping extremity edema. 2+ pedal pulses. No carotid bruits.  Abdomen: mild diffuse tenderness on dep palpation with active bowel sounds no masses   No hepatosplenomegaly.   Musculoskeletal: no clubbing / cyanosis. No joint deformity upper and lower extremities. Good ROM, no contractures. Normal muscle tone.  Skin:  chromic edema in both legsL>R with chronic bandages  Neurologic: CN 2-12 grossly intact. Sensation intact, DTR normal. Strength 5/5 in all 4.  Psychiatric: Normal judgment and insight. Alert and oriented x 3. Normal mood.     Labs on Admission: I have personally reviewed following labs and imaging studies  CBC:  Recent  Labs Lab 11/21/15 1343  WBC 6.1  HGB 12.3  HCT 38.1  MCV 85.0  PLT 123XX123    Basic Metabolic Panel:  Recent Labs Lab 11/21/15 1343  NA 137  K 2.9*  CL 96*  CO2 31  GLUCOSE 122*  BUN 20  CREATININE 1.20*  CALCIUM 10.0  MG 1.4*    GFR: Estimated Creatinine Clearance: 29.2 mL/min (by C-G formula based on SCr of 1.2 mg/dL (H)).  Liver Function Tests:  Recent Labs Lab 11/21/15 1343  AST 23  ALT 12*  ALKPHOS 175*  BILITOT 1.2  PROT 6.3*  ALBUMIN 3.7    Recent Labs Lab 11/21/15 1343  LIPASE 23   No results for input(s): AMMONIA in the last 168 hours.  Coagulation Profile:  Recent Labs Lab 11/21/15 1343  INR 1.84    Cardiac Enzymes: No results for input(s): CKTOTAL, CKMB, CKMBINDEX, TROPONINI in the last 168 hours.  BNP (last 3 results) No results for input(s): PROBNP in the last 8760 hours.  HbA1C: No results for input(s): HGBA1C in the last 72 hours.  CBG: No results for input(s): GLUCAP in the last 168 hours.  Lipid Profile: No results for input(s): CHOL, HDL, LDLCALC, TRIG, CHOLHDL, LDLDIRECT in the last 72 hours.  Thyroid Function Tests: No results for input(s): TSH, T4TOTAL, FREET4, T3FREE, THYROIDAB in the last 72 hours.  Anemia Panel: No results for input(s): VITAMINB12, FOLATE, FERRITIN, TIBC, IRON, RETICCTPCT in the last 72 hours.  Urine analysis:    Component Value Date/Time   COLORURINE YELLOW 07/24/2013 0527   APPEARANCEUR CLEAR 07/24/2013 0527   LABSPEC 1.009 07/24/2013 0527   PHURINE 8.0 07/24/2013 0527   GLUCOSEU NEGATIVE 07/24/2013 0527   HGBUR TRACE (A) 07/24/2013 0527   BILIRUBINUR NEGATIVE  07/24/2013 0527   KETONESUR NEGATIVE 07/24/2013 0527   PROTEINUR NEGATIVE 07/24/2013 0527   UROBILINOGEN 0.2 07/24/2013 0527   NITRITE NEGATIVE 07/24/2013 0527   LEUKOCYTESUR NEGATIVE 07/24/2013 0527    Sepsis Labs: @LABRCNTIP (procalcitonin:4,lacticidven:4) )No results found for this or any previous visit (from the past 240 hour(s)).   Radiological Exams on Admission: No results found.  EKG: Independently reviewed.  Assessment/Plan Active Problems:   Diarrhea   Hypokalemia due to loss of potassium   Hypothyroidism   Hypertension   CAD (coronary artery disease)   Panic attacks   Restless legs syndrome   Diarrhea. In the setting of recent treatment with Keflex-discontinued by patient due to diarrhea-, Dulcolax last night for constipation. HAd 3-4 bowel movements prior to admisson, none here.   DDx includes infectious diarrhea vs IBS. Afebrile. VSS.  Received 2 L IVF. C diff pending . WBC normal. No abdominal pain  Enteric precautions C. Diff Clear liquid diet and advance as tolerated  IVF 100 cc h Imodium if C. Diff is negative  Check CBC and CMET in am Probiotics Antiemetics  GI consult if symptoms do not improve   Anxiety and Depression Continue Xanax, Atrivan, Cymbalta  Hypokalemia, may be due to volume loss Current K 2.9 Received 2 runs KCL IV  Check Mg and EKG  Replenish 2 more runs of KCL  Repeat CMET in am  Acute Kidney Injury likely due to dehydration and LAsix . No confusion, Afebrile.  Lab Results  Component Value Date   CREATININE 1.20 (H) 11/21/2015   CREATININE 0.85 04/14/2015   CREATININE 0.81 07/24/2013  IVF CMET in am  Check UA     Hypertension BP 139/81   Pulse 97   Temp 98.6 F (  37 C) (Oral)   Resp 16   Ht 5' (1.524 m)   Wt 68.9 kg (152 lb)   SpO2 98%   BMI 29.69 kg/m  Controlled Continue meds   Hypothyroidism: -Continue home Synthroid  Chronic Lower extremity edema Patient requires chronic bandage changes daily.    DVT  prophylaxis: Heparin sq. Patient has not been lately taking Coumadin and last dose is unknown  Code Status:   Full Family Communication:  Discussed with patient son Disposition Plan: Expect patient to be discharged to home after condition improves Consults called:    None Admission status:  Tele Obs  Hans Rusher E, PA-C [facility]   If 7PM-7AM, please contact night-coverage [email or web address] Password [email or web address]  11/21/2015, 4:21 PM

## 2015-11-22 ENCOUNTER — Observation Stay (HOSPITAL_COMMUNITY): Payer: Medicare Other

## 2015-11-22 DIAGNOSIS — E876 Hypokalemia: Secondary | ICD-10-CM

## 2015-11-22 DIAGNOSIS — I10 Essential (primary) hypertension: Secondary | ICD-10-CM | POA: Diagnosis not present

## 2015-11-22 DIAGNOSIS — K5900 Constipation, unspecified: Secondary | ICD-10-CM | POA: Diagnosis not present

## 2015-11-22 DIAGNOSIS — E039 Hypothyroidism, unspecified: Secondary | ICD-10-CM

## 2015-11-22 DIAGNOSIS — K529 Noninfective gastroenteritis and colitis, unspecified: Secondary | ICD-10-CM | POA: Diagnosis not present

## 2015-11-22 DIAGNOSIS — R197 Diarrhea, unspecified: Secondary | ICD-10-CM

## 2015-11-22 LAB — URINALYSIS, ROUTINE W REFLEX MICROSCOPIC
BILIRUBIN URINE: NEGATIVE
GLUCOSE, UA: NEGATIVE mg/dL
Hgb urine dipstick: NEGATIVE
KETONES UR: NEGATIVE mg/dL
NITRITE: POSITIVE — AB
PH: 6 (ref 5.0–8.0)
Protein, ur: NEGATIVE mg/dL

## 2015-11-22 LAB — C DIFFICILE QUICK SCREEN W PCR REFLEX
C DIFFICILE (CDIFF) TOXIN: NEGATIVE
C Diff antigen: NEGATIVE
C Diff interpretation: NOT DETECTED

## 2015-11-22 LAB — CBC
HEMATOCRIT: 33.9 % — AB (ref 36.0–46.0)
Hemoglobin: 10.7 g/dL — ABNORMAL LOW (ref 12.0–15.0)
MCH: 26.9 pg (ref 26.0–34.0)
MCHC: 31.6 g/dL (ref 30.0–36.0)
MCV: 85.2 fL (ref 78.0–100.0)
Platelets: 180 10*3/uL (ref 150–400)
RBC: 3.98 MIL/uL (ref 3.87–5.11)
RDW: 16.2 % — AB (ref 11.5–15.5)
WBC: 4.2 10*3/uL (ref 4.0–10.5)

## 2015-11-22 LAB — COMPREHENSIVE METABOLIC PANEL
ALK PHOS: 129 U/L — AB (ref 38–126)
ALT: 12 U/L — ABNORMAL LOW (ref 14–54)
ANION GAP: 9 (ref 5–15)
AST: 19 U/L (ref 15–41)
Albumin: 3 g/dL — ABNORMAL LOW (ref 3.5–5.0)
BILIRUBIN TOTAL: 1.2 mg/dL (ref 0.3–1.2)
BUN: 15 mg/dL (ref 6–20)
CALCIUM: 8.9 mg/dL (ref 8.9–10.3)
CO2: 31 mmol/L (ref 22–32)
Chloride: 99 mmol/L — ABNORMAL LOW (ref 101–111)
Creatinine, Ser: 0.86 mg/dL (ref 0.44–1.00)
GFR calc Af Amer: >60 mL/min (ref >60)
GFR, EST NON AFRICAN AMERICAN: 59 mL/min — AB (ref >60)
Glucose, Bld: 80 mg/dL (ref 65–99)
POTASSIUM: 2.6 mmol/L — AB (ref 3.5–5.1)
Sodium: 139 mmol/L (ref 135–145)
TOTAL PROTEIN: 5.3 g/dL — AB (ref 6.5–8.1)

## 2015-11-22 LAB — URINE MICROSCOPIC-ADD ON

## 2015-11-22 LAB — POTASSIUM: Potassium: 3.3 mmol/L — ABNORMAL LOW (ref 3.5–5.1)

## 2015-11-22 MED ORDER — WARFARIN - PHARMACIST DOSING INPATIENT
Freq: Every day | Status: DC
  Administered 2015-11-23: 18:00:00

## 2015-11-22 MED ORDER — ALPRAZOLAM 0.5 MG PO TABS
1.0000 mg | ORAL_TABLET | Freq: Every day | ORAL | Status: DC
  Administered 2015-11-22: 1 mg via ORAL
  Filled 2015-11-22: qty 2

## 2015-11-22 MED ORDER — WARFARIN SODIUM 5 MG PO TABS
5.0000 mg | ORAL_TABLET | Freq: Once | ORAL | Status: AC
  Administered 2015-11-22: 5 mg via ORAL
  Filled 2015-11-22: qty 1

## 2015-11-22 MED ORDER — MAGNESIUM SULFATE 2 GM/50ML IV SOLN
2.0000 g | Freq: Once | INTRAVENOUS | Status: AC
  Administered 2015-11-22: 2 g via INTRAVENOUS
  Filled 2015-11-22: qty 50

## 2015-11-22 MED ORDER — ALPRAZOLAM 0.5 MG PO TABS
1.0000 mg | ORAL_TABLET | Freq: Once | ORAL | Status: AC
  Administered 2015-11-22: 1 mg via ORAL
  Filled 2015-11-22: qty 2

## 2015-11-22 MED ORDER — POTASSIUM CHLORIDE 10 MEQ/100ML IV SOLN
10.0000 meq | INTRAVENOUS | Status: AC
Start: 2015-11-22 — End: 2015-11-22
  Administered 2015-11-22 (×6): 10 meq via INTRAVENOUS
  Filled 2015-11-22 (×3): qty 100

## 2015-11-22 MED ORDER — ALPRAZOLAM 0.5 MG PO TABS
0.5000 mg | ORAL_TABLET | Freq: Every day | ORAL | Status: DC
  Administered 2015-11-23: 0.5 mg via ORAL
  Filled 2015-11-22: qty 1

## 2015-11-22 MED ORDER — POTASSIUM CHLORIDE CRYS ER 20 MEQ PO TBCR
40.0000 meq | EXTENDED_RELEASE_TABLET | Freq: Once | ORAL | Status: AC
  Filled 2015-11-22: qty 2

## 2015-11-22 MED ORDER — POTASSIUM CHLORIDE CRYS ER 20 MEQ PO TBCR
40.0000 meq | EXTENDED_RELEASE_TABLET | Freq: Once | ORAL | Status: AC
  Administered 2015-11-22: 40 meq via ORAL
  Filled 2015-11-22: qty 2

## 2015-11-22 NOTE — Progress Notes (Signed)
ANTICOAGULATION CONSULT NOTE - Initial Consult  Pharmacy Consult for coumadin Indication: atrial fibrillation  Allergies  Allergen Reactions  . Cefdinir Diarrhea  . Codeine Other (See Comments)    nervous    Patient Measurements: Height: 5' (152.4 cm) Weight: 152 lb (68.9 kg) IBW/kg (Calculated) : 45.5  Vital Signs: Temp: 98.2 F (36.8 C) (10/09 0647) Temp Source: Oral (10/09 0647) BP: 157/97 (10/09 0647) Pulse Rate: 77 (10/09 0647)  Labs:  Recent Labs  11/21/15 1343 11/22/15 0447  HGB 12.3 10.7*  HCT 38.1 33.9*  PLT 208 180  LABPROT 21.5*  --   INR 1.84  --   CREATININE 1.20* 0.86    Estimated Creatinine Clearance: 40.7 mL/min (by C-G formula based on SCr of 0.86 mg/dL).   Medical History: Past Medical History:  Diagnosis Date  . Coronary artery disease   . Hypertension   . Hypothyroidism   . Panic attacks   . RLS (restless legs syndrome)   . Thyroid disease   . Vitamin D deficiency     Medications:  Prescriptions Prior to Admission  Medication Sig Dispense Refill Last Dose  . albuterol (PROVENTIL HFA;VENTOLIN HFA) 108 (90 Base) MCG/ACT inhaler Inhale 2 puffs into the lungs every 6 (six) hours as needed for wheezing or shortness of breath.   several months ago  . ALPRAZolam (XANAX) 0.5 MG tablet Take 0.5-1 mg by mouth See admin instructions. Take 1 tablet (0.5 mg) by mouth every morning and 2 tablets (1 mg) every night   11/20/2015 at pm  . aspirin EC 81 MG tablet Take 81 mg by mouth daily.   11/20/2015 at Unknown time  . diltiazem (CARTIA XT) 240 MG 24 hr capsule Take 240 mg by mouth daily.   11/20/2015 at Unknown time  . DULoxetine (CYMBALTA) 30 MG capsule Take 30 mg by mouth 3 (three) times daily.   11/20/2015 at Unknown time  . fentaNYL (DURAGESIC - DOSED MCG/HR) 75 MCG/HR Place 75 mcg onto the skin every 3 (three) days.    within past 3 days  . furosemide (LASIX) 80 MG tablet Take 80 mg by mouth daily as needed for fluid or edema (swelling).     11/19/2015  . HYDROcodone-acetaminophen (NORCO) 10-325 MG per tablet Take 1-2 tablets by mouth every 4 (four) hours as needed for moderate pain (pain).    11/20/2015 at Unknown time  . levothyroxine (SYNTHROID, LEVOTHROID) 137 MCG tablet Take 137 mcg by mouth daily before breakfast.   11/20/2015 at Unknown time  . POTASSIUM PO Take 1 tablet by mouth at bedtime.   11/20/2015 at Unknown time  . saccharomyces boulardii (FLORASTOR) 250 MG capsule Take 250 mg by mouth 2 (two) times daily.   11/20/2015 at Unknown time  . tamsulosin (FLOMAX) 0.4 MG CAPS capsule Take 0.4 mg by mouth daily.   11/20/2015 at Unknown time  . warfarin (COUMADIN) 2.5 MG tablet Take 2.5-5 mg by mouth See admin instructions. Take 2 tablets (5 mg) by mouth Monday and Thursday at 4pm, take 1 tablet (2.5 mg) Sunday, Tuesday, Wednesday, Friday, Saturday at 4pm   11/20/2015 at 1600  . LORazepam (ATIVAN) 1 MG tablet Take 1 tablet (1 mg total) by mouth 3 (three) times daily as needed for anxiety. (Patient not taking: Reported on 11/21/2015) 15 tablet 0 Not Taking at Unknown time  . traMADol (ULTRAM) 50 MG tablet Take 1 tablet (50 mg total) by mouth every 6 (six) hours as needed. (Patient not taking: Reported on 11/21/2015) 15 tablet 0  Not Taking at Unknown time    Assessment: 82 yo F admitted with diarrhea and weakness.  Pharmacy consulted to resume home coumadin for hx afib.  Home dose is 5 mg on Monday and Thursdays and 2.5 mg all other days, her last dose was 10/7 so she missed dose 10/8.  Admit INR was 1.84 on 10/8 which is subtherapeutic.  She has been on sq heparin for VTE px.  Hg 12.3>10.7 probably dilutional effect as pt was dehydrated on admission.   Goal of Therapy:  INR 2-3 Monitor platelets by anticoagulation protocol: Yes   Plan: coumadin 5 mg po x 1 dose today Daily INR Continue sq heparin until INR > 1 or pt discharged home  Eudelia Bunch, Pharm.D. QP:[id number] 11/22/2015 11:56 AM

## 2015-11-22 NOTE — Progress Notes (Signed)
Amion notification to Dr. Eliseo Squires with report of decreased potassium level (2.6). Response pending

## 2015-11-22 NOTE — Progress Notes (Signed)
PROGRESS NOTE    Leah [name]  [id number] DOB: [birth year] DOA: 11/21/2015 PCP: Tivis Ringer, MD   Outpatient Specialists:     Brief Narrative:  Leah [name] is a 82 y.o. female with a Past Medical History was recently treated urinary tract infection with Keflex discontinued due to diarrhea and complete the course, now presenting with diarrhea since this morning, watery in nature after laxative use yesterday for constipation about a 4-5 days.    Assessment & Plan:   Active Problems:   Diarrhea   Hypokalemia due to loss of potassium   Hypothyroidism   Hypertension   CAD (coronary artery disease)   Panic attacks   Restless legs syndrome   Hypokalemia -replete along with low Mg -recheck this PM and again in the AM  Diarrhea -recent constipation -get DG abd to r/o fecal impaction with liquid stool around -advance diet to regular  HTN -resume home meds  AKI -resolve -d/c IVF  PT eval    DVT prophylaxis:  SQ Heparin  Code Status: Full Code  Family Communication: patient  Disposition Plan:  D/c once K repleted   Consultants:        Subjective: hungry  Objective: Vitals:   11/21/15 1703 11/21/15 1845 11/21/15 2026 11/22/15 0647  BP:  (!) 167/93 (!) 147/90 (!) 157/97  Pulse: 89 97 89 77  Resp:   18 20  Temp:  98.7 F (37.1 C) 97.5 F (36.4 C) 98.2 F (36.8 C)  TempSrc:  Oral Oral Oral  SpO2: 96% 92% 96% 95%  Weight:      Height:        Intake/Output Summary (Last 24 hours) at 11/22/15 0928 Last data filed at 11/22/15 0900  Gross per 24 hour  Intake             1000 ml  Output              660 ml  Net              340 ml   Filed Weights   11/21/15 1341  Weight: 68.9 kg (152 lb)    Examination:  General exam: Appears calm and comfortable  Respiratory system: Clear to auscultation. Respiratory effort normal. Cardiovascular system: S1 & S2 heard, RRR. No JVD, murmurs, rubs, gallops or clicks. No pedal  edema. Gastrointestinal system: Abdomen is nondistended, soft and nontender. No organomegaly or masses felt. Normal bowel sounds heard. Central nervous system: Alert and oriented. No focal neurological deficits. LE edema left > right-- patient states chronic    Data Reviewed: I have personally reviewed following labs and imaging studies  CBC:  Recent Labs Lab 11/21/15 1343 11/22/15 0447  WBC 6.1 4.2  HGB 12.3 10.7*  HCT 38.1 33.9*  MCV 85.0 85.2  PLT 208 [id number]   Basic Metabolic Panel:  Recent Labs Lab 11/21/15 1343 11/22/15 0447  NA 137 139  K 2.9* 2.6*  CL 96* 99*  CO2 31 31  GLUCOSE 122* 80  BUN 20 15  CREATININE 1.20* 0.86  CALCIUM 10.0 8.9  MG 1.4*  --    GFR: Estimated Creatinine Clearance: 40.7 mL/min (by C-G formula based on SCr of 0.86 mg/dL). Liver Function Tests:  Recent Labs Lab 11/21/15 1343 11/22/15 0447  AST 23 19  ALT 12* 12*  ALKPHOS 175* 129*  BILITOT 1.2 1.2  PROT 6.3* 5.3*  ALBUMIN 3.7 3.0*    Recent Labs Lab 11/21/15 1343  LIPASE 23   No results for  input(s): AMMONIA in the last 168 hours. Coagulation Profile:  Recent Labs Lab 11/21/15 1343  INR 1.84   Cardiac Enzymes: No results for input(s): CKTOTAL, CKMB, CKMBINDEX, TROPONINI in the last 168 hours. BNP (last 3 results) No results for input(s): PROBNP in the last 8760 hours. HbA1C: No results for input(s): HGBA1C in the last 72 hours. CBG: No results for input(s): GLUCAP in the last 168 hours. Lipid Profile: No results for input(s): CHOL, HDL, LDLCALC, TRIG, CHOLHDL, LDLDIRECT in the last 72 hours. Thyroid Function Tests: No results for input(s): TSH, T4TOTAL, FREET4, T3FREE, THYROIDAB in the last 72 hours. Anemia Panel: No results for input(s): VITAMINB12, FOLATE, FERRITIN, TIBC, IRON, RETICCTPCT in the last 72 hours. Urine analysis:    Component Value Date/Time   COLORURINE YELLOW 11/22/2015 0052   APPEARANCEUR CLEAR 11/22/2015 0052   LABSPEC <1.005 (L)  11/22/2015 0052   PHURINE 6.0 11/22/2015 0052   GLUCOSEU NEGATIVE 11/22/2015 0052   HGBUR NEGATIVE 11/22/2015 0052   BILIRUBINUR NEGATIVE 11/22/2015 0052   KETONESUR NEGATIVE 11/22/2015 0052   PROTEINUR NEGATIVE 11/22/2015 0052   UROBILINOGEN 0.2 07/24/2013 0527   NITRITE POSITIVE (A) 11/22/2015 0052   LEUKOCYTESUR TRACE (A) 11/22/2015 0052     ) Recent Results (from the past 240 hour(s))  C difficile quick scan w PCR reflex     Status: None   Collection Time: 11/22/15  1:44 AM  Result Value Ref Range Status   C Diff antigen NEGATIVE NEGATIVE Final   C Diff toxin NEGATIVE NEGATIVE Final   C Diff interpretation No C. difficile detected.  Final      Anti-infectives    None       Radiology Studies: No results found.      Scheduled Meds: . ALPRAZolam  1-2 mg Oral See admin instructions  . aspirin EC  81 mg Oral Daily  . diltiazem  240 mg Oral Daily  . DULoxetine  30 mg Oral TID  . furosemide  80 mg Oral Daily  . heparin  5,000 Units Subcutaneous Q8H  . levothyroxine  137 mcg Oral QAC breakfast  . magnesium sulfate 1 - 4 g bolus IVPB  2 g Intravenous Once  . potassium chloride  10 mEq Intravenous Q1 Hr x 6  . potassium chloride  40 mEq Oral Once  . potassium chloride  40 mEq Oral Once  . saccharomyces boulardii  250 mg Oral BID  . sodium chloride flush  3 mL Intravenous Q12H  . tamsulosin  0.4 mg Oral Daily   Continuous Infusions:    LOS: 0 days    Time spent: 35 min    [place], DO [facility] Pager [phone]  If 7PM-7AM, please contact night-coverage [email or web address] Password [email or web address] 11/22/2015, 9:28 AM

## 2015-11-22 NOTE — Care Management Obs Status (Signed)
[place] NOTIFICATION   Patient Details  Name: Leah [name] MRN: [id number] Date of Birth: 1927-05-05   Medicare Observation Status Notification Given:  Yes  Attempted to explain Medicare observation letter to patient, however, patient very tired, she consented for NCM to call family.  Called   Elspeth Cho (Daughter) Jalinda Seger (Son) Showing 2 of 4    [id number] [phone]     Left message for Katharine Look , called Danny . Read letter and explained letter to [facility]. Danny voiced understanding regarding Medicare will not cover SNF and medications. Copy of letter left for [facility] and patient at bedside   Marilu Favre, RN 11/22/2015, 9:44 AM

## 2015-11-23 DIAGNOSIS — E876 Hypokalemia: Secondary | ICD-10-CM | POA: Diagnosis not present

## 2015-11-23 DIAGNOSIS — K529 Noninfective gastroenteritis and colitis, unspecified: Secondary | ICD-10-CM | POA: Diagnosis not present

## 2015-11-23 DIAGNOSIS — R197 Diarrhea, unspecified: Secondary | ICD-10-CM | POA: Diagnosis not present

## 2015-11-23 DIAGNOSIS — E860 Dehydration: Secondary | ICD-10-CM | POA: Diagnosis not present

## 2015-11-23 LAB — CBC
HEMATOCRIT: 41.7 % (ref 36.0–46.0)
HEMOGLOBIN: 13.3 g/dL (ref 12.0–15.0)
MCH: 27.3 pg (ref 26.0–34.0)
MCHC: 31.9 g/dL (ref 30.0–36.0)
MCV: 85.5 fL (ref 78.0–100.0)
Platelets: 212 10*3/uL (ref 150–400)
RBC: 4.88 MIL/uL (ref 3.87–5.11)
RDW: 16.4 % — ABNORMAL HIGH (ref 11.5–15.5)
WBC: 5 10*3/uL (ref 4.0–10.5)

## 2015-11-23 LAB — BASIC METABOLIC PANEL
Anion gap: 12 (ref 5–15)
BUN: 6 mg/dL (ref 6–20)
CHLORIDE: 98 mmol/L — AB (ref 101–111)
CO2: 29 mmol/L (ref 22–32)
Calcium: 9.8 mg/dL (ref 8.9–10.3)
Creatinine, Ser: 0.76 mg/dL (ref 0.44–1.00)
GFR calc non Af Amer: >60 mL/min (ref >60)
Glucose, Bld: 102 mg/dL — ABNORMAL HIGH (ref 65–99)
POTASSIUM: 3.2 mmol/L — AB (ref 3.5–5.1)
Sodium: 139 mmol/L (ref 135–145)

## 2015-11-23 LAB — PROTIME-INR
INR: 1.65
PROTHROMBIN TIME: 19.7 s — AB (ref 11.4–15.2)

## 2015-11-23 LAB — MAGNESIUM: Magnesium: 1.5 mg/dL — ABNORMAL LOW (ref 1.7–2.4)

## 2015-11-23 MED ORDER — MAGNESIUM CHLORIDE 64 MG PO TBEC: 1.0000 | DELAYED_RELEASE_TABLET | Freq: Every day | ORAL | Status: DC

## 2015-11-23 MED ORDER — WARFARIN SODIUM 5 MG PO TABS
5.0000 mg | ORAL_TABLET | Freq: Once | ORAL | Status: AC
  Administered 2015-11-23: 5 mg via ORAL
  Filled 2015-11-23: qty 1

## 2015-11-23 MED ORDER — ALBUTEROL SULFATE HFA 108 (90 BASE) MCG/ACT IN AERS: 2.0000 | INHALATION_SPRAY | Freq: Four times a day (QID) | RESPIRATORY_TRACT | 1 refills | Status: AC | PRN

## 2015-11-23 MED ORDER — MAGNESIUM CHLORIDE 64 MG PO TBEC: 1.0000 | DELAYED_RELEASE_TABLET | Freq: Every day | ORAL | 0 refills | Status: AC

## 2015-11-23 MED ORDER — POTASSIUM CHLORIDE CRYS ER 20 MEQ PO TBCR
40.0000 meq | EXTENDED_RELEASE_TABLET | Freq: Once | ORAL | Status: AC
  Administered 2015-11-23: 40 meq via ORAL
  Filled 2015-11-23: qty 2

## 2015-11-23 MED ORDER — POTASSIUM CHLORIDE CRYS ER 15 MEQ PO TBCR: 30.0000 meq | EXTENDED_RELEASE_TABLET | Freq: Every day | ORAL | 0 refills | Status: AC

## 2015-11-23 MED ORDER — POTASSIUM CHLORIDE CRYS ER 20 MEQ PO TBCR: 20.0000 meq | EXTENDED_RELEASE_TABLET | Freq: Every day | ORAL | Status: DC

## 2015-11-23 MED ORDER — SENNOSIDES-DOCUSATE SODIUM 8.6-50 MG PO TABS: 1.0000 | ORAL_TABLET | Freq: Every evening | ORAL | Status: DC | PRN

## 2015-11-23 MED ORDER — POTASSIUM CHLORIDE CRYS ER 20 MEQ PO TBCR: 30.0000 meq | EXTENDED_RELEASE_TABLET | Freq: Every day | ORAL | Status: DC

## 2015-11-23 MED ORDER — MAGNESIUM SULFATE 2 GM/50ML IV SOLN
2.0000 g | Freq: Once | INTRAVENOUS | Status: AC
  Administered 2015-11-23: 2 g via INTRAVENOUS
  Filled 2015-11-23: qty 50

## 2015-11-23 MED ORDER — HYDROCODONE-ACETAMINOPHEN 10-325 MG PO TABS
1.0000 | ORAL_TABLET | ORAL | Status: DC | PRN
  Administered 2015-11-23: 1 via ORAL
  Filled 2015-11-23: qty 1

## 2015-11-23 NOTE — Discharge Summary (Signed)
Physician Discharge Summary  ARMANEE [name] [id number] DOB: [birth year] DOA: 11/21/2015  PCP: Tivis Ringer, MD  Admit date: 11/21/2015 Discharge date: 11/23/2015   Recommendations for Outpatient Follow-Up:   1. BMP, Mg 1 week 2. Home health- family to provide 24 hour supervision   Discharge Diagnosis:   Active Problems:   Diarrhea   Hypokalemia due to loss of potassium   Hypothyroidism   Hypertension   CAD (coronary artery disease)   Panic attacks   Restless legs syndrome   Discharge disposition:  Home.  Discharge Condition: Improved.  Diet recommendation: Low sodium, heart healthy.  Carbohydrate-modified  Wound care: None.   History of Present Illness:   Leah [name] is a 82 y.o. female with a Past Medical History was recently treated urinary tract infection with Keflex discontinued due to diarrhea and I complete the course, now presenting with diarrhea since this morning, watery in nature after laxative use yesterday for constipation about a 4-5 days.    Hospital Course by Problem:   Hypokalemia -replete along with low Mg -Kdur needed when taking lasix  Diarrhea -chronic -Mg and K replacement daily  HTN -resume home meds  AKI -resolved -d/c IVF     Medical Consultants:    None.   Discharge Exam:   Vitals:   11/22/15 2300 11/23/15 0613  BP: (!) 153/80 (!) 172/86  Pulse:  91  Resp:  18  Temp:  98 F (36.7 C)   Vitals:   11/22/15 2300 11/23/15 0000 11/23/15 0500 11/23/15 0613  BP: (!) 153/80   (!) 172/86  Pulse:    91  Resp:    18  Temp:    98 F (36.7 C)  TempSrc:    Oral  SpO2:    96%  Weight:  59 kg (130 lb 1.1 oz) 59 kg (130 lb)   Height:        Gen:  NAD    The results of significant diagnostics from this hospitalization (including imaging, microbiology, ancillary and laboratory) are listed below for reference.     Procedures and Diagnostic Studies:   Dg Abd Portable 1v  Result Date:  11/22/2015 CLINICAL DATA:  Generalized abdominal pain, constipation EXAM: PORTABLE ABDOMEN - 1 VIEW COMPARISON:  None. FINDINGS: Nonobstructive bowel gas pattern. Prior cholecystectomy. No free air, organomegaly or suspicious calcification. Vascular calcifications in the aorta and iliac vessels. IMPRESSION: Nonobstructive bowel gas pattern. Prior cholecystectomy. Aortoiliac atherosclerosis. Electronically Signed   By: Rolm Baptise M.D.   On: 11/22/2015 09:40     Labs:   Basic Metabolic Panel:  Recent Labs Lab 11/21/15 1343 11/22/15 0447 11/22/15 1521 11/23/15 0500  NA 137 139  --  139  K 2.9* 2.6* 3.3* 3.2*  CL 96* 99*  --  98*  CO2 31 31  --  29  GLUCOSE 122* 80  --  102*  BUN 20 15  --  6  CREATININE 1.20* 0.86  --  0.76  CALCIUM 10.0 8.9  --  9.8  MG 1.4*  --   --  1.5*   GFR Estimated Creatinine Clearance: 40.6 mL/min (by C-G formula based on SCr of 0.76 mg/dL). Liver Function Tests:  Recent Labs Lab 11/21/15 1343 11/22/15 0447  AST 23 19  ALT 12* 12*  ALKPHOS 175* 129*  BILITOT 1.2 1.2  PROT 6.3* 5.3*  ALBUMIN 3.7 3.0*    Recent Labs Lab 11/21/15 1343  LIPASE 23   No results for input(s): AMMONIA in the last 168  hours. Coagulation profile  Recent Labs Lab 11/21/15 1343 11/23/15 0500  INR 1.84 1.65    CBC:  Recent Labs Lab 11/21/15 1343 11/22/15 0447 11/23/15 0500  WBC 6.1 4.2 5.0  HGB 12.3 10.7* 13.3  HCT 38.1 33.9* 41.7  MCV 85.0 85.2 85.5  PLT 208 180 212   Cardiac Enzymes: No results for input(s): CKTOTAL, CKMB, CKMBINDEX, TROPONINI in the last 168 hours. BNP: Invalid input(s): POCBNP CBG: No results for input(s): GLUCAP in the last 168 hours. D-Dimer No results for input(s): DDIMER in the last 72 hours. Hgb A1c No results for input(s): HGBA1C in the last 72 hours. Lipid Profile No results for input(s): CHOL, HDL, LDLCALC, TRIG, CHOLHDL, LDLDIRECT in the last 72 hours. Thyroid function studies No results for input(s): TSH,  T4TOTAL, T3FREE, THYROIDAB in the last 72 hours.  Invalid input(s): FREET3 Anemia work up No results for input(s): VITAMINB12, FOLATE, FERRITIN, TIBC, IRON, RETICCTPCT in the last 72 hours. Microbiology Recent Results (from the past 240 hour(s))  C difficile quick scan w PCR reflex     Status: None   Collection Time: 11/22/15  1:44 AM  Result Value Ref Range Status   C Diff antigen NEGATIVE NEGATIVE Final   C Diff toxin NEGATIVE NEGATIVE Final   C Diff interpretation No C. difficile detected.  Final  Culture, Urine     Status: Abnormal (Preliminary result)   Collection Time: 11/22/15  1:57 PM  Result Value Ref Range Status   Specimen Description URINE, RANDOM  Final   Special Requests NONE  Final   Culture >=100,000 COLONIES/mL ESCHERICHIA COLI (A)  Final   Report Status PENDING  Incomplete     Discharge Instructions:   Discharge Instructions    Diet - low sodium heart healthy    Complete by:  As directed    Discharge instructions    Complete by:  As directed    BMP, Mg 1 week   Increase activity slowly    Complete by:  As directed        Medication List    STOP taking these medications   LORazepam 1 MG tablet Commonly known as:  ATIVAN   POTASSIUM PO   traMADol 50 MG tablet Commonly known as:  ULTRAM     TAKE these medications   albuterol 108 (90 Base) MCG/ACT inhaler Commonly known as:  PROVENTIL HFA;VENTOLIN HFA Inhale 2 puffs into the lungs every 6 (six) hours as needed for wheezing or shortness of breath.   ALPRAZolam 0.5 MG tablet Commonly known as:  XANAX Take 0.5-1 mg by mouth See admin instructions. Take 1 tablet (0.5 mg) by mouth every morning and 2 tablets (1 mg) every night   aspirin EC 81 MG tablet Take 81 mg by mouth daily.   CARTIA XT 240 MG 24 hr capsule Generic drug:  diltiazem Take 240 mg by mouth daily.   DULoxetine 30 MG capsule Commonly known as:  CYMBALTA Take 30 mg by mouth 3 (three) times daily.   fentaNYL 75 MCG/HR Commonly  known as:  DURAGESIC - dosed mcg/hr Place 75 mcg onto the skin every 3 (three) days.   furosemide 80 MG tablet Commonly known as:  LASIX Take 80 mg by mouth daily as needed for fluid or edema (swelling).   HYDROcodone-acetaminophen 10-325 MG tablet Commonly known as:  NORCO Take 1-2 tablets by mouth every 4 (four) hours as needed for moderate pain (pain).   levothyroxine 137 MCG tablet Commonly known as:  SYNTHROID, LEVOTHROID  Take 137 mcg by mouth daily before breakfast.   magnesium chloride 64 MG Tbec SR tablet Commonly known as:  SLOW-MAG Take 1 tablet (64 mg total) by mouth daily. Start taking on:  11/24/2015   potassium chloride SA 15 MEQ tablet Commonly known as:  KLOR-CON M15 Take 2 tablets (30 mEq total) by mouth daily. USE WHEN TAKING LASIX Start taking on:  11/24/2015   saccharomyces boulardii 250 MG capsule Commonly known as:  FLORASTOR Take 250 mg by mouth 2 (two) times daily.   senna-docusate 8.6-50 MG tablet Commonly known as:  Senokot-S Take 1 tablet by mouth at bedtime as needed for mild constipation.   tamsulosin 0.4 MG Caps capsule Commonly known as:  FLOMAX Take 0.4 mg by mouth daily.   warfarin 2.5 MG tablet Commonly known as:  COUMADIN Take 2.5-5 mg by mouth See admin instructions. Take 2 tablets (5 mg) by mouth Monday and Thursday at 4pm, take 1 tablet (2.5 mg) Sunday, Tuesday, Wednesday, Friday, Saturday at [place], MD Follow up in 1 week(s).   Specialty:  Internal Medicine Contact information: [address] [place] [address] [phone]            Time coordinating discharge: 35 min  Signed:  Gurkirat Basher Alison Stalling   [facility] 11/23/2015, 2:23 PM

## 2015-11-23 NOTE — Progress Notes (Signed)
PT Cancellation Note  Patient Details Name: Leah [name] MRN: [id number] DOB: 07/01/1927   Cancelled Treatment:    Reason Eval/Treat Not Completed: Other (comment)   Completed history with patient and son. Patient eating lunch and requested PT return when she is finished. Will return ASAP.   Gerturde Kuba 11/23/2015, 1:44 PM  Pager [phone]

## 2015-11-23 NOTE — Care Management Note (Signed)
Case Management Note  Patient Details  Name: Leah [name] MRN: [id number] Date of Birth: 02-16-1927  Subjective/Objective:                    Action/Plan:   Expected Discharge Date:                  Expected Discharge Plan:  [place]  In-House Referral:     Discharge planning Services  CM Consult  Post Acute Care Choice:  Home Health, Durable Medical Equipment Choice offered to:  Patient  DME Arranged:  Walker rolling DME Agency:  [place] Arranged:  PT, Nurse's Aide [place] Agency:  [place]  Status of Service:  Completed, signed off  If discussed at [place] of Stay Meetings, dates discussed:    Additional Comments:  Marilu Favre, RN 11/23/2015, 3:50 PM

## 2015-11-23 NOTE — Evaluation (Signed)
Physical Therapy Evaluation Patient Details Name: Leah [name] MRN: [id number] DOB: [birth year] Today's Date: 11/23/2015   History of Present Illness  82 y.o.femalewith recent UTI treated with Keflex, discontinued due to diarrhea not completing the course, now presenting with diarrhea since this morning, watery, after the use of laxatives on 10/7, after not having a bowel movement for 4-5 days. She denies any blood in the stools. She had between 3 and 4 bowel movements, with no recurrence during the hospitalization. She has had very poor appetite over the last week, with decreased liquid intake. She feels very dry. She also reports nausea, and intermittent cramping due to flatulence.. She denies any dizziness, presyncope or syncope. Denies any falls . No confusion has been reported.    Clinical Impression  Pt admitted with above diagnosis. Pt currently with functional limitations due to the deficits listed below (see PT Problem List). Patient required increased time with dyspnea to complete bed to [facility] and back to EOB for dressing lower body. Ultimately she had to return to supine due to fatigue and dyspnea with increasing anxiety. Son earlier stated the family could provide needed assistance upon discharge. Patient does NOT want to go to SNF for therapy. She wants to try home first, and if "that does not go well" will consider SNF. Pt will benefit from skilled PT to increase their independence and safety with mobility to allow discharge to the venue listed below.       Follow Up Recommendations Home health PT;Supervision/Assistance - 24 hour ([place] aide)    Equipment Recommendations  Rolling walker with 5" wheels (pt reports hers is >5 yrs old and broken)    Recommendations for Other Services OT consult     Precautions / Restrictions Precautions Precautions: Fall Precaution Comments: denies h/o falls; currently weaker than baseline      Mobility  Bed Mobility Overal bed mobility:  Modified Independent             General bed mobility comments: supine to sit, to supine, to sit, to supine--all in the process of changing lower body clothing   Transfers Overall transfer level: Needs assistance Equipment used: Rolling walker (2 wheeled) Transfers: Sit to/from Stand Sit to Stand: Min guard         General transfer comment: weak with near need for assist; vc to widen stance and improve balance; pt demonstrated carryover x 2 transfers  Ambulation/Gait Ambulation/Gait assistance: Min guard Ambulation Distance (Feet): 3 Feet (seated rest,3 ft, then 2 ft) Assistive device: Rolling walker (2 wheeled) Gait Pattern/deviations: Step-to pattern;Decreased stride length;Trunk flexed;Narrow base of support     General Gait Details: walk to [facility], to bed, to [place]            Wheelchair Mobility    Modified [place]in (Stroke Patients Only)       Balance Overall balance assessment: Needs assistance Sitting-balance support: No upper extremity supported;Feet supported Sitting balance-[name]: Fair     Standing balance support: No upper extremity supported;During functional activity Standing balance-[name]: Fair Standing balance comment: reporting she feels weak, therefore closeguarding                             Pertinent Vitals/Pain Pain Assessment: No/denies pain    Home Living Family/patient expects to be discharged to:: Private residence Living Arrangements: Alone Available Help at Discharge: Family;Available PRN/intermittently (son, dtr-in-law live nearby) Type of Home: House Home Access: Stairs to enter  Entrance Stairs-Number of Steps: 1 Home Layout: One level Home Equipment: Clinical cytogeneticist - 2 wheels;Bedside commode;Grab bars - tub/shower;Hand held shower head;Hospital bed (reports her walker is broken)      Prior Function Level of Independence: Needs assistance   Gait / Transfers Assistance Needed: walks with  RW; can walk into stores if goes with family  ADL's / Homemaking Assistance Needed: family shops for her  Comments: hospital bed too tall and sleeps on the couch     Hand Dominance        Extremity/Trunk Assessment   Upper Extremity Assessment: Generalized weakness           Lower Extremity Assessment: LLE deficits/detail;Generalized weakness   LLE Deficits / Details: 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 Discussed possibility she would need someone to be with her 24/7 and he reported they would make it work. Son and dtr-in-law are retired    Chiropractor     Assessment/Plan    PT Assessment All further PT needs can be met in the next venue of care  PT Problem List Decreased strength;Decreased activity tolerance;Decreased balance;Decreased mobility;Cardiopulmonary status limiting activity          PT Treatment Interventions      PT Goals (Current goals can be found in the Care Plan section)  Acute Rehab PT Goals Patient Stated Goal: go home--wants to try; will consider SNF if "things don't go well" PT Goal Formulation: All assessment and education complete, DC therapy    Frequency     Barriers to discharge Decreased caregiver support son reported "we'll make it work" when asked if can provide 24 hr care on discharge    Co-evaluation               End of Session Equipment Utilized During Treatment: Gait belt Activity Tolerance: Patient limited by fatigue (pt reports dyspnea due to anxiety) Patient left: in bed;with call bell/phone within reach;with nursing/sitter in room Nurse  Communication: Mobility status (need 24 hr care; HHPT, RW)    Functional Assessment Tool Used: clinical observation Functional Limitation: Mobility: Walking and moving around Mobility: Walking and Moving Around Current Status (O6712): At least 20 percent but less than 40 percent impaired, limited or restricted Mobility: Walking and Moving Around Goal Status [phone]): At least 20 percent but less than 40 percent impaired, limited or restricted Mobility: Walking and Moving Around Discharge Status [phone]): At least 20 percent but less than 40 percent impaired, limited or restricted    Time: 1439-1533 PT Time Calculation (min) (ACUTE ONLY): 54 min   Charges:   PT Evaluation $PT Eval Moderate Complexity: 1 Procedure PT Treatments $Therapeutic Activity: 8-22 mins $Self Care/Home Management: 8-22   PT G Codes:   PT G-Codes **NOT FOR INPATIENT CLASS** Functional Assessment Tool Used: clinical observation Functional Limitation: Mobility: Walking and moving around Mobility: Walking and Moving Around Current Status (S5053): At least 20 percent but less than 40 percent impaired, limited or restricted Mobility: Walking and Moving Around Goal Status [phone]): At least 20 percent but less than 40 percent impaired, limited or restricted Mobility: Walking and Moving Around  Discharge Status [phone]): At least 20 percent but less than 40 percent impaired, limited or restricted    Nollan Muldrow 11/23/2015, 3:52 PM Pager [phone]

## 2015-11-23 NOTE — Progress Notes (Signed)
Discharge home. Home discharge instruction given to daughter, no question verbalized.. HH needs addressed by CM.

## 2015-11-23 NOTE — Progress Notes (Signed)
[place] for coumadin Indication: atrial fibrillation  Allergies  Allergen Reactions  . Cefdinir Diarrhea  . Codeine Other (See Comments)    nervous    Patient Measurements: Height: 5' (152.4 cm) Weight: 130 lb (59 kg) IBW/kg (Calculated) : 45.5  Vital Signs: Temp: 98 F (36.7 C) (10/10 0613) Temp Source: Oral (10/10 0613) BP: 172/86 (10/10 0613) Pulse Rate: 91 (10/10 0613)  Labs:  Recent Labs  11/21/15 1343 11/22/15 0447 11/23/15 0500  HGB 12.3 10.7* 13.3  HCT 38.1 33.9* 41.7  PLT 208 180 212  LABPROT 21.5*  --  19.7*  INR 1.84  --  1.65  CREATININE 1.20* 0.86 0.76    Estimated Creatinine Clearance: 40.6 mL/min (by C-G formula based on SCr of 0.76 mg/dL).  Assessment: 82 yo F admitted with diarrhea and weakness.  Pharmacy consulted to resume home coumadin for hx afib.  Home dose is 5 mg on Monday and Thursdays and 2.5 mg all other days, her last dose was 10/7 so she missed dose 10/8.  She has been on sq heparin for VTE px.  Hg 12.3>10.7>13.3, PLTC WNL.  No bleeding reported.  INR 1.65 is subtherapeutic, dose missed on Sunday.   Goal of Therapy:  INR 2-3 Monitor platelets by anticoagulation protocol: Yes   Plan: coumadin 5 mg po x 1 dose today Daily INR Continue sq heparin until INR > 2 or pt discharged home  Eudelia Bunch, Pharm.D. [id number] 11/23/2015 1:59 PM

## 2015-11-24 DIAGNOSIS — F410 Panic disorder [episodic paroxysmal anxiety] without agoraphobia: Secondary | ICD-10-CM | POA: Diagnosis not present

## 2015-11-24 DIAGNOSIS — I10 Essential (primary) hypertension: Secondary | ICD-10-CM | POA: Diagnosis not present

## 2015-11-24 DIAGNOSIS — Z7901 Long term (current) use of anticoagulants: Secondary | ICD-10-CM | POA: Diagnosis not present

## 2015-11-24 DIAGNOSIS — Z7982 Long term (current) use of aspirin: Secondary | ICD-10-CM | POA: Diagnosis not present

## 2015-11-24 DIAGNOSIS — I2510 Atherosclerotic heart disease of native coronary artery without angina pectoris: Secondary | ICD-10-CM | POA: Diagnosis not present

## 2015-11-24 DIAGNOSIS — R531 Weakness: Secondary | ICD-10-CM | POA: Diagnosis not present

## 2015-11-24 DIAGNOSIS — E038 Other specified hypothyroidism: Secondary | ICD-10-CM | POA: Diagnosis not present

## 2015-11-25 LAB — URINE CULTURE: Culture: >=100000 — AB

## 2015-11-29 DIAGNOSIS — R531 Weakness: Secondary | ICD-10-CM | POA: Diagnosis not present

## 2015-11-29 DIAGNOSIS — I10 Essential (primary) hypertension: Secondary | ICD-10-CM | POA: Diagnosis not present

## 2015-12-01 DIAGNOSIS — R531 Weakness: Secondary | ICD-10-CM | POA: Diagnosis not present

## 2015-12-01 DIAGNOSIS — I10 Essential (primary) hypertension: Secondary | ICD-10-CM | POA: Diagnosis not present

## 2015-12-02 DIAGNOSIS — I480 Paroxysmal atrial fibrillation: Secondary | ICD-10-CM | POA: Diagnosis not present

## 2015-12-02 DIAGNOSIS — I10 Essential (primary) hypertension: Secondary | ICD-10-CM | POA: Diagnosis not present

## 2015-12-02 DIAGNOSIS — Z7901 Long term (current) use of anticoagulants: Secondary | ICD-10-CM | POA: Diagnosis not present

## 2015-12-02 DIAGNOSIS — E038 Other specified hypothyroidism: Secondary | ICD-10-CM | POA: Diagnosis not present

## 2015-12-06 DIAGNOSIS — I10 Essential (primary) hypertension: Secondary | ICD-10-CM | POA: Diagnosis not present

## 2015-12-06 DIAGNOSIS — R531 Weakness: Secondary | ICD-10-CM | POA: Diagnosis not present

## 2015-12-08 DIAGNOSIS — M19011 Primary osteoarthritis, right shoulder: Secondary | ICD-10-CM | POA: Diagnosis not present

## 2015-12-09 DIAGNOSIS — R531 Weakness: Secondary | ICD-10-CM | POA: Diagnosis not present

## 2015-12-09 DIAGNOSIS — I10 Essential (primary) hypertension: Secondary | ICD-10-CM | POA: Diagnosis not present

## 2015-12-13 DIAGNOSIS — R531 Weakness: Secondary | ICD-10-CM | POA: Diagnosis not present

## 2015-12-13 DIAGNOSIS — I10 Essential (primary) hypertension: Secondary | ICD-10-CM | POA: Diagnosis not present

## 2015-12-14 DIAGNOSIS — I480 Paroxysmal atrial fibrillation: Secondary | ICD-10-CM | POA: Diagnosis not present

## 2015-12-14 DIAGNOSIS — Z6822 Body mass index (BMI) 22.0-22.9, adult: Secondary | ICD-10-CM | POA: Diagnosis not present

## 2015-12-14 DIAGNOSIS — R634 Abnormal weight loss: Secondary | ICD-10-CM | POA: Diagnosis not present

## 2015-12-14 DIAGNOSIS — Z7901 Long term (current) use of anticoagulants: Secondary | ICD-10-CM | POA: Diagnosis not present

## 2015-12-14 DIAGNOSIS — E876 Hypokalemia: Secondary | ICD-10-CM | POA: Diagnosis not present

## 2015-12-16 DIAGNOSIS — I10 Essential (primary) hypertension: Secondary | ICD-10-CM | POA: Diagnosis not present

## 2015-12-16 DIAGNOSIS — R531 Weakness: Secondary | ICD-10-CM | POA: Diagnosis not present

## 2015-12-21 DIAGNOSIS — G894 Chronic pain syndrome: Secondary | ICD-10-CM | POA: Diagnosis not present

## 2015-12-21 DIAGNOSIS — I509 Heart failure, unspecified: Secondary | ICD-10-CM | POA: Diagnosis not present

## 2015-12-21 DIAGNOSIS — R634 Abnormal weight loss: Secondary | ICD-10-CM | POA: Diagnosis not present

## 2015-12-21 DIAGNOSIS — E876 Hypokalemia: Secondary | ICD-10-CM | POA: Diagnosis not present

## 2015-12-21 DIAGNOSIS — Z6823 Body mass index (BMI) 23.0-23.9, adult: Secondary | ICD-10-CM | POA: Diagnosis not present

## 2015-12-21 DIAGNOSIS — R197 Diarrhea, unspecified: Secondary | ICD-10-CM | POA: Diagnosis not present

## 2015-12-21 DIAGNOSIS — I87319 Chronic venous hypertension (idiopathic) with ulcer of unspecified lower extremity: Secondary | ICD-10-CM | POA: Diagnosis not present

## 2015-12-21 DIAGNOSIS — R531 Weakness: Secondary | ICD-10-CM | POA: Diagnosis not present

## 2015-12-21 DIAGNOSIS — Z7901 Long term (current) use of anticoagulants: Secondary | ICD-10-CM | POA: Diagnosis not present

## 2015-12-21 DIAGNOSIS — I10 Essential (primary) hypertension: Secondary | ICD-10-CM | POA: Diagnosis not present

## 2015-12-22 DIAGNOSIS — I480 Paroxysmal atrial fibrillation: Secondary | ICD-10-CM | POA: Diagnosis not present

## 2015-12-22 DIAGNOSIS — I872 Venous insufficiency (chronic) (peripheral): Secondary | ICD-10-CM | POA: Diagnosis not present

## 2015-12-22 DIAGNOSIS — R197 Diarrhea, unspecified: Secondary | ICD-10-CM | POA: Diagnosis not present

## 2015-12-22 DIAGNOSIS — I10 Essential (primary) hypertension: Secondary | ICD-10-CM | POA: Diagnosis not present

## 2015-12-22 DIAGNOSIS — E038 Other specified hypothyroidism: Secondary | ICD-10-CM | POA: Diagnosis not present

## 2015-12-22 DIAGNOSIS — R634 Abnormal weight loss: Secondary | ICD-10-CM | POA: Diagnosis not present

## 2015-12-22 DIAGNOSIS — Z7901 Long term (current) use of anticoagulants: Secondary | ICD-10-CM | POA: Diagnosis not present

## 2015-12-22 DIAGNOSIS — E876 Hypokalemia: Secondary | ICD-10-CM | POA: Diagnosis not present

## 2015-12-23 DIAGNOSIS — I10 Essential (primary) hypertension: Secondary | ICD-10-CM | POA: Diagnosis not present

## 2015-12-23 DIAGNOSIS — R531 Weakness: Secondary | ICD-10-CM | POA: Diagnosis not present

## 2015-12-27 ENCOUNTER — Emergency Department (HOSPITAL_COMMUNITY): Payer: Medicare Other

## 2015-12-27 ENCOUNTER — Emergency Department (HOSPITAL_COMMUNITY)
Admission: EM | Admit: 2015-12-27 | Discharge: 2015-12-27 | Disposition: A | Discharge status: Home / Self Care | Payer: Medicare Other | Attending: Emergency Medicine | Admitting: Emergency Medicine

## 2015-12-27 ENCOUNTER — Encounter (HOSPITAL_COMMUNITY): Payer: Self-pay | Admitting: Emergency Medicine

## 2015-12-27 DIAGNOSIS — S01112A Laceration without foreign body of left eyelid and periocular area, initial encounter: Secondary | ICD-10-CM | POA: Insufficient documentation

## 2015-12-27 DIAGNOSIS — W19XXXA Unspecified fall, initial encounter: Secondary | ICD-10-CM

## 2015-12-27 DIAGNOSIS — W228XXA Striking against or struck by other objects, initial encounter: Secondary | ICD-10-CM | POA: Diagnosis not present

## 2015-12-27 DIAGNOSIS — Y939 Activity, unspecified: Secondary | ICD-10-CM | POA: Insufficient documentation

## 2015-12-27 DIAGNOSIS — Z7901 Long term (current) use of anticoagulants: Secondary | ICD-10-CM | POA: Diagnosis not present

## 2015-12-27 DIAGNOSIS — Z87891 Personal history of nicotine dependence: Secondary | ICD-10-CM | POA: Insufficient documentation

## 2015-12-27 DIAGNOSIS — S60222A Contusion of left hand, initial encounter: Secondary | ICD-10-CM | POA: Insufficient documentation

## 2015-12-27 DIAGNOSIS — S0990XA Unspecified injury of head, initial encounter: Secondary | ICD-10-CM | POA: Diagnosis not present

## 2015-12-27 DIAGNOSIS — Y9289 Other specified places as the place of occurrence of the external cause: Secondary | ICD-10-CM | POA: Insufficient documentation

## 2015-12-27 DIAGNOSIS — I2510 Atherosclerotic heart disease of native coronary artery without angina pectoris: Secondary | ICD-10-CM | POA: Insufficient documentation

## 2015-12-27 DIAGNOSIS — Z79899 Other long term (current) drug therapy: Secondary | ICD-10-CM | POA: Diagnosis not present

## 2015-12-27 DIAGNOSIS — Y999 Unspecified external cause status: Secondary | ICD-10-CM | POA: Insufficient documentation

## 2015-12-27 DIAGNOSIS — R791 Abnormal coagulation profile: Secondary | ICD-10-CM | POA: Insufficient documentation

## 2015-12-27 DIAGNOSIS — E039 Hypothyroidism, unspecified: Secondary | ICD-10-CM | POA: Diagnosis not present

## 2015-12-27 DIAGNOSIS — S065X0A Traumatic subdural hemorrhage without loss of consciousness, initial encounter: Secondary | ICD-10-CM | POA: Diagnosis not present

## 2015-12-27 DIAGNOSIS — S60413A Abrasion of left middle finger, initial encounter: Secondary | ICD-10-CM | POA: Diagnosis not present

## 2015-12-27 DIAGNOSIS — Z7982 Long term (current) use of aspirin: Secondary | ICD-10-CM | POA: Diagnosis not present

## 2015-12-27 DIAGNOSIS — G4489 Other headache syndrome: Secondary | ICD-10-CM | POA: Diagnosis not present

## 2015-12-27 DIAGNOSIS — I10 Essential (primary) hypertension: Secondary | ICD-10-CM | POA: Diagnosis not present

## 2015-12-27 LAB — COMPREHENSIVE METABOLIC PANEL
ALK PHOS: 112 U/L (ref 38–126)
ALT: 10 U/L — ABNORMAL LOW (ref 14–54)
ANION GAP: 7 (ref 5–15)
AST: 16 U/L (ref 15–41)
Albumin: 3.3 g/dL — ABNORMAL LOW (ref 3.5–5.0)
BILIRUBIN TOTAL: 0.9 mg/dL (ref 0.3–1.2)
BUN: 13 mg/dL (ref 6–20)
CALCIUM: 9.8 mg/dL (ref 8.9–10.3)
CO2: 30 mmol/L (ref 22–32)
Chloride: 100 mmol/L — ABNORMAL LOW (ref 101–111)
Creatinine, Ser: 0.75 mg/dL (ref 0.44–1.00)
GFR calc Af Amer: >60 mL/min (ref >60)
GLUCOSE: 104 mg/dL — AB (ref 65–99)
POTASSIUM: 3.6 mmol/L (ref 3.5–5.1)
Sodium: 137 mmol/L (ref 135–145)
TOTAL PROTEIN: 6.1 g/dL — AB (ref 6.5–8.1)

## 2015-12-27 LAB — PROTIME-INR
INR: 4.66 — AB
PROTHROMBIN TIME: 45.2 s — AB (ref 11.4–15.2)

## 2015-12-27 LAB — CBC
HEMATOCRIT: 34.5 % — AB (ref 36.0–46.0)
HEMOGLOBIN: 11.3 g/dL — AB (ref 12.0–15.0)
MCH: 27.8 pg (ref 26.0–34.0)
MCHC: 32.8 g/dL (ref 30.0–36.0)
MCV: 84.8 fL (ref 78.0–100.0)
Platelets: 166 10*3/uL (ref 150–400)
RBC: 4.07 MIL/uL (ref 3.87–5.11)
RDW: 16.6 % — AB (ref 11.5–15.5)
WBC: 9 10*3/uL (ref 4.0–10.5)

## 2015-12-27 LAB — CBG MONITORING, ED: Glucose-Capillary: 88 mg/dL (ref 65–99)

## 2015-12-27 NOTE — ED Notes (Signed)
Dr Venora Maples made aware of critical lab INR 4.66.  No further orders received.

## 2015-12-27 NOTE — ED Notes (Signed)
Hooked patient back up to the monitor after xray 

## 2015-12-27 NOTE — Discharge Instructions (Signed)
Please do not take your coumadin today (Monday) or tomorrow (Tuesday)  Resume coumadin on Wednesday  Please call your Coumadin team for follow-up recheck of your levels

## 2015-12-27 NOTE — ED Notes (Signed)
Checked patient blood sugar it was 88 notified RN greg of blood sugar

## 2015-12-27 NOTE — ED Triage Notes (Signed)
Arrived via EMS patient lives with family member and one day ago mechanical fall left side head injury ecchymosis dark purple blue and left hand swelling and ecchymosis purple blue.  No LOC.  During the night patient got up to get a drink of water and family member stated patient sat on couch and was staring along with having speech "different" onset 0245.

## 2015-12-27 NOTE — ED Provider Notes (Signed)
[facility] DEPT Provider Note   CSN: [id number] Arrival date & time: 12/27/15  0915     History   Chief Complaint Chief Complaint  Patient presents with  . Fall  . Altered Mental Status    HPI Leah [name] is a 82 y.o. female.  HPI Patient is brought to the emergency department after a fall from last night.  She states she was in the bathroom when she leaned down and fell forward striking the left side of her head and her left hand.  Family reports that she seemed fine after the fall but then several hours later she had a transient staring spell and seemed slightly confused and thus the patient is brought to the ER for evaluation.  Family reports the patient is returned to baseline mental status this time.  She is on Coumadin.  No reports of abdominal pain or chest pain.  No recent illness.  No complaints of back pain.  Baseline kyphosis.    Past Medical History:  Diagnosis Date  . Coronary artery disease   . Hypertension   . Hypothyroidism   . Panic attacks   . RLS (restless legs syndrome)   . Thyroid disease   . Vitamin D deficiency     Patient Active Problem List   Diagnosis Date Noted  . Dehydration   . Diarrhea 11/21/2015  . Hypokalemia due to loss of potassium 11/21/2015  . Hypothyroidism 11/21/2015  . Hypertension 11/21/2015  . CAD (coronary artery disease) 11/21/2015  . Panic attacks 11/21/2015  . Restless legs syndrome 11/21/2015    Past Surgical History:  Procedure Laterality Date  . JOINT REPLACEMENT      OB History    No data available       Home Medications    Prior to Admission medications   Medication Sig Start Date End Date Taking? Authorizing Provider  albuterol (PROVENTIL HFA;VENTOLIN HFA) 108 (90 Base) MCG/ACT inhaler Inhale 2 puffs into the lungs every 6 (six) hours as needed for wheezing or shortness of breath. 11/23/15   Geradine Girt, DO  ALPRAZolam (XANAX) 0.5 MG tablet Take 0.5-1 mg by mouth See admin instructions. Take 1  tablet (0.5 mg) by mouth every morning and 2 tablets (1 mg) every night    Historical Provider, MD  aspirin EC 81 MG tablet Take 81 mg by mouth daily.    Historical Provider, MD  diltiazem (CARTIA XT) 240 MG 24 hr capsule Take 240 mg by mouth daily.    Historical Provider, MD  DULoxetine (CYMBALTA) 30 MG capsule Take 30 mg by mouth 3 (three) times daily.    Historical Provider, MD  fentaNYL (DURAGESIC - DOSED MCG/HR) 75 MCG/HR Place 75 mcg onto the skin every 3 (three) days.  11/16/15   Historical Provider, MD  furosemide (LASIX) 80 MG tablet Take 80 mg by mouth daily as needed for fluid or edema (swelling).     Historical Provider, MD  HYDROcodone-acetaminophen (NORCO) 10-325 MG per tablet Take 1-2 tablets by mouth every 4 (four) hours as needed for moderate pain (pain).     Historical Provider, MD  levothyroxine (SYNTHROID, LEVOTHROID) 137 MCG tablet Take 137 mcg by mouth daily before breakfast.    Historical Provider, MD  magnesium chloride (SLOW-MAG) 64 MG TBEC SR tablet Take 1 tablet (64 mg total) by mouth daily. 11/24/15   Geradine Girt, DO  potassium chloride (KLOR-CON M15) 15 MEQ tablet Take 2 tablets (30 mEq total) by mouth daily. USE WHEN TAKING LASIX  11/24/15   Geradine Girt, DO  saccharomyces boulardii (FLORASTOR) 250 MG capsule Take 250 mg by mouth 2 (two) times daily.    Historical Provider, MD  senna-docusate (SENOKOT-S) 8.6-50 MG tablet Take 1 tablet by mouth at bedtime as needed for mild constipation. 11/23/15   Geradine Girt, DO  tamsulosin (FLOMAX) 0.4 MG CAPS capsule Take 0.4 mg by mouth daily.    Historical Provider, MD  warfarin (COUMADIN) 2.5 MG tablet Take 2.5-5 mg by mouth See admin instructions. Take 2 tablets (5 mg) by mouth Monday and Thursday at 4pm, take 1 tablet (2.5 mg) Sunday, Tuesday, Wednesday, Friday, Saturday at [place] Provider, MD    Family History No family history on file.  Social History Social History  Substance Use Topics  . Smoking  status: Former Smoker    Quit date: 02/13/1966  . Smokeless tobacco: Never Used  . Alcohol use No     Allergies   Cefdinir and Codeine   Review of Systems Review of Systems  All other systems reviewed and are negative.    Physical Exam Updated Vital Signs BP 132/85   Pulse 79   Temp 98.7 F (37.1 C)   Resp 17   Ht 5' (1.524 m)   Wt 130 lb (59 kg)   SpO2 95%   BMI 25.39 kg/m   Physical Exam  Constitutional: She is oriented to person, place, and time. She appears well-developed and well-nourished. No distress.  HENT:  Head: Normocephalic.  Small ecchymosis to left lateral periorbital rim with small skin tear.  No active bleeding.  Eyes: EOM are normal.  Neck: Normal range of motion. Neck supple.  No C-spine tenderness  Cardiovascular: Normal rate, regular rhythm and normal heart sounds.   Pulmonary/Chest: Effort normal and breath sounds normal.  Abdominal: Soft. She exhibits no distension. There is no tenderness.  Musculoskeletal: She exhibits no edema or tenderness.  Ecchymosis of the dorsum of her left hand without obvious deformity.  No focal tenderness.  Full range of motion bilateral shoulders and elbows.  No leg shortening noted.  No obvious deformity to hips and knees.  Neurological: She is alert and oriented to person, place, and time.  Skin: Skin is warm and dry.  Psychiatric: She has a normal mood and affect. Judgment normal.  Nursing note and vitals reviewed.    ED Treatments / Results  Labs (all labs ordered are listed, but only abnormal results are displayed) Labs Reviewed  COMPREHENSIVE METABOLIC PANEL - Abnormal; Notable for the following:       Result Value   Chloride 100 (*)    Glucose, Bld 104 (*)    Total Protein 6.1 (*)    Albumin 3.3 (*)    ALT 10 (*)    All other components within normal limits  CBC - Abnormal; Notable for the following:    Hemoglobin 11.3 (*)    HCT 34.5 (*)    RDW 16.6 (*)    All other components within normal  limits  PROTIME-INR - Abnormal; Notable for the following:    Prothrombin Time 45.2 (*)    INR 4.66 (*)    All other components within normal limits  CBG MONITORING, ED  CBG MONITORING, ED    EKG  EKG Interpretation  Date/Time:  Monday December 27 2015 09:22:16 EST Ventricular Rate:  96 PR Interval:    QRS Duration: 109 QT Interval:  324 QTC Calculation: 410 R Axis:   -22 Text Interpretation:  Atrial fibrillation Ventricular premature complex LVH with secondary repolarization abnormality No acute changes Confirmed by Duru Reiger  MD, Lennette Bihari ([id number]) on 12/27/2015 9:40:19 AM       Radiology Ct Head Wo Contrast  Result Date: 12/27/2015 CLINICAL DATA:  Pain following fall EXAM: CT HEAD WITHOUT CONTRAST TECHNIQUE: Contiguous axial images were obtained from the base of the skull through the vertex without intravenous contrast. COMPARISON:  July 14, 2012 and March 2017 FINDINGS: Brain: The ventricles are normal in size and configuration. Several frontal hygromas remain without change. There is a extra-axial mass in the left frontal region measuring 1.8 x 1.7 cm without surrounding edema consistent with a meningioma. A second similar-appearing meningioma is noted posterior to the left temporal bone medially measuring 1.7 x 1.1 cm. This second meningioma contains mild calcification. There is no surrounding edema. These lesions are stable compared to prior studies. No new mass is evident. There is no acute hemorrhage. There is no new extra-axial fluid. No midline shift. There is patchy small vessel disease in the centra semiovale bilaterally. No new gray-white compartment lesion evident. No acute infarct. Vascular: There is no hyperdense vessel. There is calcification in both carotid siphon regions. Skull: Bony calvarium appears intact. Sinuses/Orbits: Paranasal sinuses clear. Orbits appear symmetric bilaterally. Other: Mastoid air cells are clear. IMPRESSION: Atrophy with stable subdural hygromas  bilaterally. No acute hemorrhage or new/progressed extra-axial fluid. Meningiomas in the left frontal and left posterior fossa lesions remain without change. No evidence of surrounding edema. Patchy periventricular small vessel disease is stable. No acute infarct evident. There are foci calcification in each carotid siphon. Electronically Signed   By: Lowella Grip III M.D.   On: 12/27/2015 10:51   Dg Hand Complete Left  Result Date: 12/27/2015 CLINICAL DATA:  82 year old female with history of trauma from a fall from the commode with injury to the left hand and abrasion on the knuckle of the middle finger. Patient is on blood thinners. EXAM: LEFT HAND - COMPLETE 3+ VIEW COMPARISON:  No priors. FINDINGS: Three views of the left hand demonstrate no acute displaced fracture or dislocation. Osteopenia. Multifocal joint space narrowing, subchondral sclerosis, subchondral cyst formation and osteophyte formation, most pronounced at the DIP and PIP joints, as well as the radiocarpal joint and in particular at the first [facility] joint where there is proximal subluxation. IMPRESSION: 1. No acute radiographic abnormality of the left hand. 2. Chronic degenerative changes of osteoarthritis, as above. 3. Osteopenia. Electronically Signed   By: Vinnie Langton M.D.   On: 12/27/2015 10:47    Procedures Procedures (including critical care time)  Medications Ordered in ED Medications - No data to display   Initial Impression / Assessment and Plan / ED Course  I have reviewed the triage vital signs and the nursing notes.  Pertinent labs & imaging results that were available during my care of the patient were reviewed by me and considered in my medical decision making (see chart for details).  Clinical Course     Minor head injury.  Baseline mental status now.  Workup in the emergency department is without abnormality.  CT head demonstrates chronic bilateral subdural hygromas without acute changes.  Patient does  have a supratherapeutic INR for which she's been told to hold her Coumadin the next 2 doses and then follow-up with her Coumadin team.  She and her daughter understand to return to the ER for any new or worsening or concerning symptoms  Final Clinical Impressions(s) / ED Diagnoses   Final diagnoses:  Fall, initial encounter  Supratherapeutic INR  Contusion of left hand, initial encounter  Injury of head, initial encounter    New Prescriptions New Prescriptions   No medications on file     Jola Schmidt, MD 12/27/15 1147

## 2015-12-31 DIAGNOSIS — I480 Paroxysmal atrial fibrillation: Secondary | ICD-10-CM | POA: Diagnosis not present

## 2015-12-31 DIAGNOSIS — Z7901 Long term (current) use of anticoagulants: Secondary | ICD-10-CM | POA: Diagnosis not present

## 2016-01-21 DIAGNOSIS — I480 Paroxysmal atrial fibrillation: Secondary | ICD-10-CM | POA: Diagnosis not present

## 2016-01-21 DIAGNOSIS — Z7901 Long term (current) use of anticoagulants: Secondary | ICD-10-CM | POA: Diagnosis not present

## 2016-01-27 DIAGNOSIS — I119 Hypertensive heart disease without heart failure: Secondary | ICD-10-CM | POA: Diagnosis not present

## 2016-01-27 DIAGNOSIS — R634 Abnormal weight loss: Secondary | ICD-10-CM | POA: Diagnosis not present

## 2016-01-27 DIAGNOSIS — I482 Chronic atrial fibrillation: Secondary | ICD-10-CM | POA: Diagnosis not present

## 2016-01-27 DIAGNOSIS — E039 Hypothyroidism, unspecified: Secondary | ICD-10-CM | POA: Diagnosis not present

## 2016-01-27 DIAGNOSIS — E785 Hyperlipidemia, unspecified: Secondary | ICD-10-CM | POA: Diagnosis not present

## 2016-01-27 DIAGNOSIS — I872 Venous insufficiency (chronic) (peripheral): Secondary | ICD-10-CM | POA: Diagnosis not present

## 2016-01-27 DIAGNOSIS — Z7901 Long term (current) use of anticoagulants: Secondary | ICD-10-CM | POA: Diagnosis not present

## 2016-01-27 DIAGNOSIS — I340 Nonrheumatic mitral (valve) insufficiency: Secondary | ICD-10-CM | POA: Diagnosis not present

## 2016-02-24 DIAGNOSIS — E559 Vitamin D deficiency, unspecified: Secondary | ICD-10-CM | POA: Diagnosis not present

## 2016-02-24 DIAGNOSIS — I10 Essential (primary) hypertension: Secondary | ICD-10-CM | POA: Diagnosis not present

## 2016-02-24 DIAGNOSIS — E038 Other specified hypothyroidism: Secondary | ICD-10-CM | POA: Diagnosis not present

## 2016-02-24 DIAGNOSIS — I480 Paroxysmal atrial fibrillation: Secondary | ICD-10-CM | POA: Diagnosis not present

## 2016-02-24 DIAGNOSIS — N182 Chronic kidney disease, stage 2 (mild): Secondary | ICD-10-CM | POA: Diagnosis not present

## 2016-02-24 DIAGNOSIS — Z1321 Encounter for screening for nutritional disorder: Secondary | ICD-10-CM | POA: Diagnosis not present

## 2016-02-24 DIAGNOSIS — Z7901 Long term (current) use of anticoagulants: Secondary | ICD-10-CM | POA: Diagnosis not present

## 2016-02-24 DIAGNOSIS — R5383 Other fatigue: Secondary | ICD-10-CM | POA: Diagnosis not present

## 2016-03-13 DIAGNOSIS — M19011 Primary osteoarthritis, right shoulder: Secondary | ICD-10-CM | POA: Diagnosis not present

## 2016-04-03 DIAGNOSIS — R634 Abnormal weight loss: Secondary | ICD-10-CM | POA: Diagnosis not present

## 2016-04-03 DIAGNOSIS — G894 Chronic pain syndrome: Secondary | ICD-10-CM | POA: Diagnosis not present

## 2016-04-03 DIAGNOSIS — I480 Paroxysmal atrial fibrillation: Secondary | ICD-10-CM | POA: Diagnosis not present

## 2016-04-03 DIAGNOSIS — K589 Irritable bowel syndrome without diarrhea: Secondary | ICD-10-CM | POA: Diagnosis not present

## 2016-04-03 DIAGNOSIS — I10 Essential (primary) hypertension: Secondary | ICD-10-CM | POA: Diagnosis not present

## 2016-04-03 DIAGNOSIS — E559 Vitamin D deficiency, unspecified: Secondary | ICD-10-CM | POA: Diagnosis not present

## 2016-04-03 DIAGNOSIS — I87319 Chronic venous hypertension (idiopathic) with ulcer of unspecified lower extremity: Secondary | ICD-10-CM | POA: Diagnosis not present

## 2016-04-03 DIAGNOSIS — Z7901 Long term (current) use of anticoagulants: Secondary | ICD-10-CM | POA: Diagnosis not present

## 2016-04-03 DIAGNOSIS — F339 Major depressive disorder, recurrent, unspecified: Secondary | ICD-10-CM | POA: Diagnosis not present

## 2016-04-12 DIAGNOSIS — M65351 Trigger finger, right little finger: Secondary | ICD-10-CM | POA: Diagnosis not present

## 2016-04-12 DIAGNOSIS — M65331 Trigger finger, right middle finger: Secondary | ICD-10-CM | POA: Diagnosis not present

## 2016-04-13 DIAGNOSIS — Z7901 Long term (current) use of anticoagulants: Secondary | ICD-10-CM | POA: Diagnosis not present

## 2016-04-13 DIAGNOSIS — I480 Paroxysmal atrial fibrillation: Secondary | ICD-10-CM | POA: Diagnosis not present

## 2016-04-27 DIAGNOSIS — Z7901 Long term (current) use of anticoagulants: Secondary | ICD-10-CM | POA: Diagnosis not present

## 2016-04-27 DIAGNOSIS — I480 Paroxysmal atrial fibrillation: Secondary | ICD-10-CM | POA: Diagnosis not present

## 2016-05-08 ENCOUNTER — Encounter (HOSPITAL_COMMUNITY): Payer: Self-pay

## 2016-05-08 ENCOUNTER — Inpatient Hospital Stay (HOSPITAL_COMMUNITY)
Admission: EM | Admit: 2016-05-08 | Discharge: 2016-05-17 | DRG: 082 | Disposition: A | Discharge status: Skilled Nursing Facility | Payer: Medicare Other | Attending: General Surgery | Admitting: General Surgery

## 2016-05-08 ENCOUNTER — Emergency Department (HOSPITAL_COMMUNITY): Payer: Medicare Other

## 2016-05-08 DIAGNOSIS — D420 Neoplasm of uncertain behavior of cerebral meninges: Secondary | ICD-10-CM | POA: Diagnosis not present

## 2016-05-08 DIAGNOSIS — S61412A Laceration without foreign body of left hand, initial encounter: Secondary | ICD-10-CM | POA: Diagnosis not present

## 2016-05-08 DIAGNOSIS — S065XAA Traumatic subdural hemorrhage with loss of consciousness status unknown, initial encounter: Secondary | ICD-10-CM

## 2016-05-08 DIAGNOSIS — Z23 Encounter for immunization: Secondary | ICD-10-CM | POA: Diagnosis not present

## 2016-05-08 DIAGNOSIS — S0012XA Contusion of left eyelid and periocular area, initial encounter: Secondary | ICD-10-CM | POA: Diagnosis present

## 2016-05-08 DIAGNOSIS — M79609 Pain in unspecified limb: Secondary | ICD-10-CM | POA: Diagnosis not present

## 2016-05-08 DIAGNOSIS — M7989 Other specified soft tissue disorders: Secondary | ICD-10-CM | POA: Diagnosis not present

## 2016-05-08 DIAGNOSIS — Z87891 Personal history of nicotine dependence: Secondary | ICD-10-CM | POA: Diagnosis not present

## 2016-05-08 DIAGNOSIS — R52 Pain, unspecified: Secondary | ICD-10-CM | POA: Diagnosis not present

## 2016-05-08 DIAGNOSIS — Z471 Aftercare following joint replacement surgery: Secondary | ICD-10-CM | POA: Diagnosis not present

## 2016-05-08 DIAGNOSIS — S0280XA Fracture of other specified skull and facial bones, unspecified side, initial encounter for closed fracture: Secondary | ICD-10-CM | POA: Diagnosis not present

## 2016-05-08 DIAGNOSIS — S0240DA Maxillary fracture, left side, initial encounter for closed fracture: Secondary | ICD-10-CM | POA: Diagnosis present

## 2016-05-08 DIAGNOSIS — I4891 Unspecified atrial fibrillation: Secondary | ICD-10-CM | POA: Diagnosis present

## 2016-05-08 DIAGNOSIS — R410 Disorientation, unspecified: Secondary | ICD-10-CM

## 2016-05-08 DIAGNOSIS — Z7901 Long term (current) use of anticoagulants: Secondary | ICD-10-CM | POA: Diagnosis not present

## 2016-05-08 DIAGNOSIS — S065X0D Traumatic subdural hemorrhage without loss of consciousness, subsequent encounter: Secondary | ICD-10-CM | POA: Diagnosis not present

## 2016-05-08 DIAGNOSIS — R0602 Shortness of breath: Secondary | ICD-10-CM | POA: Diagnosis not present

## 2016-05-08 DIAGNOSIS — S52122A Displaced fracture of head of left radius, initial encounter for closed fracture: Secondary | ICD-10-CM | POA: Diagnosis present

## 2016-05-08 DIAGNOSIS — S065X9A Traumatic subdural hemorrhage with loss of consciousness of unspecified duration, initial encounter: Principal | ICD-10-CM | POA: Diagnosis present

## 2016-05-08 DIAGNOSIS — S0240FA Zygomatic fracture, left side, initial encounter for closed fracture: Secondary | ICD-10-CM | POA: Diagnosis present

## 2016-05-08 DIAGNOSIS — M25512 Pain in left shoulder: Secondary | ICD-10-CM

## 2016-05-08 DIAGNOSIS — I517 Cardiomegaly: Secondary | ICD-10-CM | POA: Diagnosis not present

## 2016-05-08 DIAGNOSIS — K567 Ileus, unspecified: Secondary | ICD-10-CM | POA: Diagnosis not present

## 2016-05-08 DIAGNOSIS — R1319 Other dysphagia: Secondary | ICD-10-CM | POA: Diagnosis not present

## 2016-05-08 DIAGNOSIS — S2249XA Multiple fractures of ribs, unspecified side, initial encounter for closed fracture: Secondary | ICD-10-CM

## 2016-05-08 DIAGNOSIS — E876 Hypokalemia: Secondary | ICD-10-CM | POA: Diagnosis not present

## 2016-05-08 DIAGNOSIS — R0603 Acute respiratory distress: Secondary | ICD-10-CM | POA: Diagnosis present

## 2016-05-08 DIAGNOSIS — I10 Essential (primary) hypertension: Secondary | ICD-10-CM | POA: Diagnosis present

## 2016-05-08 DIAGNOSIS — E860 Dehydration: Secondary | ICD-10-CM | POA: Diagnosis not present

## 2016-05-08 DIAGNOSIS — S0232XA Fracture of orbital floor, left side, initial encounter for closed fracture: Secondary | ICD-10-CM | POA: Diagnosis present

## 2016-05-08 DIAGNOSIS — R791 Abnormal coagulation profile: Secondary | ICD-10-CM | POA: Diagnosis present

## 2016-05-08 DIAGNOSIS — R600 Localized edema: Secondary | ICD-10-CM | POA: Diagnosis not present

## 2016-05-08 DIAGNOSIS — F410 Panic disorder [episodic paroxysmal anxiety] without agoraphobia: Secondary | ICD-10-CM | POA: Diagnosis present

## 2016-05-08 DIAGNOSIS — Z6825 Body mass index (BMI) 25.0-25.9, adult: Secondary | ICD-10-CM | POA: Diagnosis not present

## 2016-05-08 DIAGNOSIS — F05 Delirium due to known physiological condition: Secondary | ICD-10-CM | POA: Diagnosis not present

## 2016-05-08 DIAGNOSIS — M6281 Muscle weakness (generalized): Secondary | ICD-10-CM | POA: Diagnosis not present

## 2016-05-08 DIAGNOSIS — S02402D Zygomatic fracture, unspecified, subsequent encounter for fracture with routine healing: Secondary | ICD-10-CM | POA: Diagnosis not present

## 2016-05-08 DIAGNOSIS — S2231XA Fracture of one rib, right side, initial encounter for closed fracture: Secondary | ICD-10-CM | POA: Diagnosis present

## 2016-05-08 DIAGNOSIS — R451 Restlessness and agitation: Secondary | ICD-10-CM | POA: Diagnosis not present

## 2016-05-08 DIAGNOSIS — E43 Unspecified severe protein-calorie malnutrition: Secondary | ICD-10-CM | POA: Diagnosis present

## 2016-05-08 DIAGNOSIS — R488 Other symbolic dysfunctions: Secondary | ICD-10-CM | POA: Diagnosis not present

## 2016-05-08 DIAGNOSIS — S40022A Contusion of left upper arm, initial encounter: Secondary | ICD-10-CM | POA: Diagnosis not present

## 2016-05-08 DIAGNOSIS — W1830XA Fall on same level, unspecified, initial encounter: Secondary | ICD-10-CM | POA: Diagnosis present

## 2016-05-08 DIAGNOSIS — S065X0A Traumatic subdural hemorrhage without loss of consciousness, initial encounter: Secondary | ICD-10-CM | POA: Diagnosis not present

## 2016-05-08 DIAGNOSIS — S199XXA Unspecified injury of neck, initial encounter: Secondary | ICD-10-CM | POA: Diagnosis not present

## 2016-05-08 DIAGNOSIS — S52122D Displaced fracture of head of left radius, subsequent encounter for closed fracture with routine healing: Secondary | ICD-10-CM | POA: Diagnosis not present

## 2016-05-08 DIAGNOSIS — R140 Abdominal distension (gaseous): Secondary | ICD-10-CM | POA: Diagnosis not present

## 2016-05-08 DIAGNOSIS — R197 Diarrhea, unspecified: Secondary | ICD-10-CM | POA: Diagnosis not present

## 2016-05-08 DIAGNOSIS — I2510 Atherosclerotic heart disease of native coronary artery without angina pectoris: Secondary | ICD-10-CM | POA: Diagnosis present

## 2016-05-08 DIAGNOSIS — I6200 Nontraumatic subdural hemorrhage, unspecified: Secondary | ICD-10-CM

## 2016-05-08 DIAGNOSIS — Y92009 Unspecified place in unspecified non-institutional (private) residence as the place of occurrence of the external cause: Secondary | ICD-10-CM | POA: Diagnosis not present

## 2016-05-08 DIAGNOSIS — E039 Hypothyroidism, unspecified: Secondary | ICD-10-CM | POA: Diagnosis present

## 2016-05-08 DIAGNOSIS — R4182 Altered mental status, unspecified: Secondary | ICD-10-CM | POA: Diagnosis not present

## 2016-05-08 DIAGNOSIS — E569 Vitamin deficiency, unspecified: Secondary | ICD-10-CM | POA: Diagnosis not present

## 2016-05-08 DIAGNOSIS — G2581 Restless legs syndrome: Secondary | ICD-10-CM | POA: Diagnosis present

## 2016-05-08 DIAGNOSIS — S52182A Other fracture of upper end of left radius, initial encounter for closed fracture: Secondary | ICD-10-CM | POA: Diagnosis not present

## 2016-05-08 DIAGNOSIS — M79605 Pain in left leg: Secondary | ICD-10-CM

## 2016-05-08 DIAGNOSIS — D320 Benign neoplasm of cerebral meninges: Secondary | ICD-10-CM | POA: Diagnosis present

## 2016-05-08 DIAGNOSIS — W19XXXA Unspecified fall, initial encounter: Secondary | ICD-10-CM | POA: Diagnosis not present

## 2016-05-08 DIAGNOSIS — R110 Nausea: Secondary | ICD-10-CM | POA: Diagnosis not present

## 2016-05-08 DIAGNOSIS — M79632 Pain in left forearm: Secondary | ICD-10-CM

## 2016-05-08 DIAGNOSIS — Z9181 History of falling: Secondary | ICD-10-CM

## 2016-05-08 DIAGNOSIS — H532 Diplopia: Secondary | ICD-10-CM | POA: Diagnosis present

## 2016-05-08 DIAGNOSIS — R339 Retention of urine, unspecified: Secondary | ICD-10-CM | POA: Diagnosis not present

## 2016-05-08 DIAGNOSIS — Z96651 Presence of right artificial knee joint: Secondary | ICD-10-CM | POA: Diagnosis not present

## 2016-05-08 DIAGNOSIS — M19012 Primary osteoarthritis, left shoulder: Secondary | ICD-10-CM | POA: Diagnosis present

## 2016-05-08 LAB — CBC WITH DIFFERENTIAL/PLATELET
Basophils Absolute: 0 10*3/uL (ref 0.0–0.1)
Basophils Relative: 0 %
EOS ABS: 0.2 10*3/uL (ref 0.0–0.7)
EOS PCT: 3 %
HCT: 35.7 % — ABNORMAL LOW (ref 36.0–46.0)
Hemoglobin: 11.4 g/dL — ABNORMAL LOW (ref 12.0–15.0)
LYMPHS ABS: 1.5 10*3/uL (ref 0.7–4.0)
Lymphocytes Relative: 24 %
MCH: 28.5 pg (ref 26.0–34.0)
MCHC: 31.9 g/dL (ref 30.0–36.0)
MCV: 89.3 fL (ref 78.0–100.0)
Monocytes Absolute: 0.7 10*3/uL (ref 0.1–1.0)
Monocytes Relative: 11 %
Neutro Abs: 4 10*3/uL (ref 1.7–7.7)
Neutrophils Relative %: 62 %
PLATELETS: 183 10*3/uL (ref 150–400)
RBC: 4 MIL/uL (ref 3.87–5.11)
RDW: 14.6 % (ref 11.5–15.5)
WBC: 6.4 10*3/uL (ref 4.0–10.5)

## 2016-05-08 LAB — MRSA PCR SCREENING: MRSA BY PCR: NEGATIVE

## 2016-05-08 LAB — BASIC METABOLIC PANEL
ANION GAP: 9 (ref 5–15)
BUN: 19 mg/dL (ref 6–20)
CHLORIDE: 97 mmol/L — AB (ref 101–111)
CO2: 32 mmol/L (ref 22–32)
Calcium: 10.1 mg/dL (ref 8.9–10.3)
Creatinine, Ser: 0.86 mg/dL (ref 0.44–1.00)
GFR calc Af Amer: >60 mL/min (ref >60)
GFR, EST NON AFRICAN AMERICAN: 59 mL/min — AB (ref >60)
GLUCOSE: 95 mg/dL (ref 65–99)
POTASSIUM: 3.6 mmol/L (ref 3.5–5.1)
Sodium: 138 mmol/L (ref 135–145)

## 2016-05-08 LAB — PROTIME-INR
INR: 2.75
INR: 1.35
INR: 1.19
PROTHROMBIN TIME: 29.7 s — AB (ref 11.4–15.2)
PROTHROMBIN TIME: 16.8 s — AB (ref 11.4–15.2)
PROTHROMBIN TIME: 15.1 s (ref 11.4–15.2)

## 2016-05-08 LAB — ABO/RH: ABO/RH(D): A POS

## 2016-05-08 MED ORDER — DILTIAZEM HCL ER COATED BEADS 240 MG PO CP24
240.0000 mg | ORAL_CAPSULE | Freq: Every day | ORAL | Status: DC
  Administered 2016-05-08 – 2016-05-17 (×10): 240 mg via ORAL
  Filled 2016-05-08 (×10): qty 1

## 2016-05-08 MED ORDER — LEVOTHYROXINE SODIUM 25 MCG PO TABS
137.0000 ug | ORAL_TABLET | Freq: Every day | ORAL | Status: DC
  Administered 2016-05-08 – 2016-05-17 (×10): 137 ug via ORAL
  Filled 2016-05-08 (×10): qty 1

## 2016-05-08 MED ORDER — SODIUM CHLORIDE 0.9 % IV SOLN
INTRAVENOUS | Status: DC
  Administered 2016-05-08 – 2016-05-09 (×3): via INTRAVENOUS
  Administered 2016-05-10: 50 mL/h via INTRAVENOUS

## 2016-05-08 MED ORDER — DULOXETINE HCL 30 MG PO CPEP
30.0000 mg | ORAL_CAPSULE | Freq: Three times a day (TID) | ORAL | Status: DC
  Administered 2016-05-08 – 2016-05-17 (×25): 30 mg via ORAL
  Filled 2016-05-08 (×29): qty 1

## 2016-05-08 MED ORDER — TAMSULOSIN HCL 0.4 MG PO CAPS
0.4000 mg | ORAL_CAPSULE | Freq: Every day | ORAL | Status: DC
  Administered 2016-05-08 – 2016-05-17 (×10): 0.4 mg via ORAL
  Filled 2016-05-08 (×10): qty 1

## 2016-05-08 MED ORDER — ACETAMINOPHEN 325 MG PO TABS
650.0000 mg | ORAL_TABLET | ORAL | Status: DC | PRN
  Administered 2016-05-13 – 2016-05-14 (×4): 650 mg via ORAL
  Filled 2016-05-08 (×5): qty 2

## 2016-05-08 MED ORDER — ALBUTEROL SULFATE (2.5 MG/3ML) 0.083% IN NEBU
2.5000 mg | INHALATION_SOLUTION | Freq: Four times a day (QID) | RESPIRATORY_TRACT | Status: DC | PRN
  Administered 2016-05-09: 2.5 mg via RESPIRATORY_TRACT
  Filled 2016-05-08: qty 3

## 2016-05-08 MED ORDER — VITAMIN K1 10 MG/ML IJ SOLN
10.0000 mg | INTRAMUSCULAR | Status: AC
  Administered 2016-05-08: 10 mg via INTRAVENOUS
  Filled 2016-05-08: qty 1

## 2016-05-08 MED ORDER — ONDANSETRON HCL 4 MG/2ML IJ SOLN: 4.0000 mg | Freq: Four times a day (QID) | INTRAMUSCULAR | Status: DC | PRN

## 2016-05-08 MED ORDER — HYDROCODONE-ACETAMINOPHEN 5-325 MG PO TABS
1.0000 | ORAL_TABLET | ORAL | Status: DC | PRN
  Administered 2016-05-13 – 2016-05-15 (×3): 1 via ORAL
  Filled 2016-05-08 (×4): qty 1

## 2016-05-08 MED ORDER — MORPHINE SULFATE (PF) 2 MG/ML IV SOLN
1.0000 mg | INTRAVENOUS | Status: DC | PRN
  Filled 2016-05-08: qty 1

## 2016-05-08 MED ORDER — MORPHINE SULFATE (PF) 4 MG/ML IV SOLN
4.0000 mg | Freq: Once | INTRAVENOUS | Status: AC
  Administered 2016-05-08: 4 mg via INTRAVENOUS
  Filled 2016-05-08: qty 1

## 2016-05-08 MED ORDER — PHYTONADIONE 5 MG PO TABS
5.0000 mg | ORAL_TABLET | Freq: Once | ORAL | Status: DC
  Filled 2016-05-08: qty 1

## 2016-05-08 MED ORDER — FENTANYL 25 MCG/HR TD PT72
75.0000 ug | MEDICATED_PATCH | TRANSDERMAL | Status: DC
  Administered 2016-05-08 – 2016-05-14 (×3): 75 ug via TRANSDERMAL
  Filled 2016-05-08 (×3): qty 3

## 2016-05-08 MED ORDER — PROTHROMBIN COMPLEX CONC HUMAN 500 UNITS IV KIT
1500.0000 [IU] | PACK | Status: AC
  Administered 2016-05-08: 1500 [IU] via INTRAVENOUS
  Filled 2016-05-08: qty 60

## 2016-05-08 MED ORDER — ORAL CARE MOUTH RINSE
15.0000 mL | Freq: Two times a day (BID) | OROMUCOSAL | Status: DC
  Administered 2016-05-08 – 2016-05-17 (×15): 15 mL via OROMUCOSAL

## 2016-05-08 MED ORDER — ONDANSETRON HCL 4 MG PO TABS: 4.0000 mg | ORAL_TABLET | Freq: Four times a day (QID) | ORAL | Status: DC | PRN

## 2016-05-08 MED ORDER — SACCHAROMYCES BOULARDII 250 MG PO CAPS
250.0000 mg | ORAL_CAPSULE | Freq: Two times a day (BID) | ORAL | Status: DC
  Administered 2016-05-08 – 2016-05-17 (×17): 250 mg via ORAL
  Filled 2016-05-08 (×18): qty 1

## 2016-05-08 MED ORDER — TETANUS-DIPHTH-ACELL PERTUSSIS 5-2.5-18.5 LF-MCG/0.5 IM SUSP
0.5000 mL | Freq: Once | INTRAMUSCULAR | Status: AC
  Administered 2016-05-08: 0.5 mL via INTRAMUSCULAR
  Filled 2016-05-08: qty 0.5

## 2016-05-08 MED ORDER — SODIUM CHLORIDE 0.9 % IV SOLN
10.0000 mL/h | Freq: Once | INTRAVENOUS | Status: AC
  Administered 2016-05-08: 10 mL/h via INTRAVENOUS

## 2016-05-08 NOTE — ED Triage Notes (Signed)
Pt states tripped and fell forward. Pt denies any LOC. Pt with skin tear to L brow and L hand. Pt states takes coumadin. Pt denies any head/neck/back pain. Pt a/o x 4 at triage.

## 2016-05-08 NOTE — H&P (Signed)
Leah [name] is an 83 y.o. female.   Chief Complaint: fall HPI: 83yof fell from standing onto face while reaching for glass of water. Left face hurts.  She remembers event.  She does have history of falls in past.  On coumadin for afib- Wynonia Lawman is cardiologist.  INR is 2.75 on arrival.    Past Medical History:  Diagnosis Date  . Coronary artery disease   . Hypertension   . Hypothyroidism   . Panic attacks   . RLS (restless legs syndrome)   . Thyroid disease   . Vitamin D deficiency     Past Surgical History:  Procedure Laterality Date  . JOINT REPLACEMENT      History reviewed. No pertinent family history. Social History:  reports that she quit smoking about 50 years ago. She has never used smokeless tobacco. She reports that she does not drink alcohol or use drugs.  Allergies:  Allergies  Allergen Reactions  . Cefdinir Diarrhea  . Codeine Other (See Comments)    nervous   meds reviewed, includes coumadin and asa  Results for orders placed or performed during the hospital encounter of 05/08/16 (from the past 48 hour(s))  CBC with Differential/Platelet     Status: Abnormal   Collection Time: 05/08/16  2:23 AM  Result Value Ref Range   WBC 6.4 4.0 - 10.5 K/uL   RBC 4.00 3.87 - 5.11 MIL/uL   Hemoglobin 11.4 (L) 12.0 - 15.0 g/dL   HCT 35.7 (L) 36.0 - 46.0 %   MCV 89.3 78.0 - 100.0 fL   MCH 28.5 26.0 - 34.0 pg   MCHC 31.9 30.0 - 36.0 g/dL   RDW 14.6 11.5 - 15.5 %   Platelets 183 150 - 400 K/uL   Neutrophils Relative % 62 %   Neutro Abs 4.0 1.7 - 7.7 K/uL   Lymphocytes Relative 24 %   Lymphs Abs 1.5 0.7 - 4.0 K/uL   Monocytes Relative 11 %   Monocytes Absolute 0.7 0.1 - 1.0 K/uL   Eosinophils Relative 3 %   Eosinophils Absolute 0.2 0.0 - 0.7 K/uL   Basophils Relative 0 %   Basophils Absolute 0.0 0.0 - 0.1 K/uL  Basic metabolic panel     Status: Abnormal   Collection Time: 05/08/16  2:23 AM  Result Value Ref Range   Sodium 138 135 - 145 mmol/L   Potassium 3.6 3.5 -  5.1 mmol/L   Chloride 97 (L) 101 - 111 mmol/L   CO2 32 22 - 32 mmol/L   Glucose, Bld 95 65 - 99 mg/dL   BUN 19 6 - 20 mg/dL   Creatinine, Ser 0.86 0.44 - 1.00 mg/dL   Calcium 10.1 8.9 - 10.3 mg/dL   GFR calc non Af Amer 59 (L) >60 mL/min   GFR calc Af Amer >60 >60 mL/min    Comment: (NOTE) The eGFR has been calculated using the CKD EPI equation. This calculation has not been validated in all clinical situations. eGFR's persistently <60 mL/min signify possible Chronic Kidney Disease.    Anion gap 9 5 - 15  Protime-INR     Status: Abnormal   Collection Time: 05/08/16  2:23 AM  Result Value Ref Range   Prothrombin Time 29.7 (H) 11.4 - 15.2 seconds   INR 2.75   ABO/Rh     Status: None   Collection Time: 05/08/16  2:23 AM  Result Value Ref Range   ABO/RH(D) A POS   Prepare fresh frozen plasma  Status: None (Preliminary result)   Collection Time: 05/08/16  4:42 AM  Result Value Ref Range   Unit Number [id number]    Blood Component Type THWPLS APHR2    Unit division 00    Status of Unit ISSUED    Transfusion Status OK TO TRANSFUSE    Unit Number [id number]    Blood Component Type THWPLS APHR2    Unit division 00    Status of Unit ALLOCATED    Transfusion Status OK TO TRANSFUSE    Ct Head Wo Contrast  Result Date: 05/08/2016 CLINICAL DATA:  Initial evaluation for acute trauma, fall. EXAM: CT HEAD WITHOUT CONTRAST CT MAXILLOFACIAL WITHOUT CONTRAST CT CERVICAL SPINE WITHOUT CONTRAST TECHNIQUE: Multidetector CT imaging of the head, cervical spine, and maxillofacial structures were performed using the standard protocol without intravenous contrast. Multiplanar CT image reconstructions of the cervical spine and maxillofacial structures were also generated. COMPARISON:  Prior CT from 12/27/2015. FINDINGS: CT HEAD FINDINGS Brain: Advanced cerebral atrophy with mild to moderate chronic microvascular ischemic disease. There is subtle hyperdensity overlying the left temporal occipital  convexity measuring up to 6 mm, suspicious for small amount of extra-axial hemorrhage (series 201, image 12). No other acute intracranial hemorrhage. No evidence for acute large vessel territory infarct. 2.6 cm meningioma overlies the left frontal convexity (series 201, image 28). Additional partially calcified 1.8 cm meningioma present at the left cerebellopontine angle (series 201, image 9). No associated mass effect or edema. No other mass lesion. No midline shift. No hydrocephalus. Vascular: No asymmetric hyperdense vessel. Scattered vascular calcifications noted within the carotid siphons. Skull: Left periorbital and facial contusion. Scalp soft tissues otherwise unremarkable. Calvarium intact. Other: Mastoids are clear. CT MAXILLOFACIAL FINDINGS Osseous: Acute comminuted fractures involve the left zygomatic arch without significant displacement. There are acute fractures through the anterior and posterior walls of the left maxillary sinus. Associated fractures of the left orbital floor and lateral wall of the bony left orbit. Constellation consistent with an acute tripod fracture. No significant displacement about the left orbital floor fracture. Right zygomatic arch and maxilla intact. Pterygoid plates intact. Nasal bones intact. Nasal septum bowed to the right but intact. No acute mandibular fracture. Mandibular condyles normally situated. Patient is edentulous. Degenerative changes noted about the temporomandibular joints. Orbits: Globes intact. Patient status post lens extraction bilaterally. Acute fractures of the left orbital floor, lateral wall of the bony left orbit, with extension towards the left orbital roof. No significant displacement at the left orbital floor fracture. Extra-ocular muscles remain normally positioned. No significant retro-orbital hematoma. Bony right orbit intact. Sinuses: Blood largely opacifies the left maxillary sinus. There is herniation of fracture antral fat into the sinus  itself (series 315, image 45). Paranasal sinuses are otherwise clear. Soft tissues: Left periorbital and facial contusion. CT CERVICAL SPINE FINDINGS Alignment: Severe kyphotic angulation with exaggeration of the normal cervical lordosis, severely limiting the study. Trace anterolisthesis of C7 on T1. No other obvious listhesis. Skull base and vertebrae: Skullbase intact. Normal C1-2 articulations preserved. Dens intact. Vertebral body heights maintained. No obvious fracture. Soft tissues and spinal canal: Visualized soft tissues of the neck grossly unremarkable. No definite prevertebral edema. Disc levels: Moderate degenerative spondylolysis noted at C6-7. No other significant degenerative changes. Upper chest: Prominent atheromatous plaque noted within the visualized aorta. Emphysematous changes noted. No apical pneumothorax. Scattered atelectatic changes noted within the visualized lungs. IMPRESSION: CT HEAD: 1. Small volume acute/ early subacute extra-axial hemorrhage overlying the posterior left temporal occipital convexity. No associated mass  effect. 2. No other acute intracranial process. 3. Meningiomas positioned at the left frontal convexity and left cerebellopontine angle without associated edema. 4. Advanced age-related cerebral atrophy. CT MAXILLOFACIAL: 1. Acute left-sided tripod fracture as detailed above. 2. Left periorbital and facial contusion. CT CERVICAL SPINE: 1. No definite acute traumatic injury within the cervical spine. Please note study is markedly limited due to severe kyphotic angulation of the thoracic spine. If there is high clinical suspicion for possible occult cervical spinal injury, a repeat study with improved positioning is recommended. 2. Emphysema. Critical Value/emergent results were called by telephone at the time of interpretation on 05/08/2016 at 4:18 am to Dr. Shirlyn Goltz , who verbally acknowledged these results. Electronically Signed   By: Jeannine Boga M.D.   On:  05/08/2016 04:27   Ct Cervical Spine Wo Contrast  Result Date: 05/08/2016 CLINICAL DATA:  Initial evaluation for acute trauma, fall. EXAM: CT HEAD WITHOUT CONTRAST CT MAXILLOFACIAL WITHOUT CONTRAST CT CERVICAL SPINE WITHOUT CONTRAST TECHNIQUE: Multidetector CT imaging of the head, cervical spine, and maxillofacial structures were performed using the standard protocol without intravenous contrast. Multiplanar CT image reconstructions of the cervical spine and maxillofacial structures were also generated. COMPARISON:  Prior CT from 12/27/2015. FINDINGS: CT HEAD FINDINGS Brain: Advanced cerebral atrophy with mild to moderate chronic microvascular ischemic disease. There is subtle hyperdensity overlying the left temporal occipital convexity measuring up to 6 mm, suspicious for small amount of extra-axial hemorrhage (series 201, image 12). No other acute intracranial hemorrhage. No evidence for acute large vessel territory infarct. 2.6 cm meningioma overlies the left frontal convexity (series 201, image 28). Additional partially calcified 1.8 cm meningioma present at the left cerebellopontine angle (series 201, image 9). No associated mass effect or edema. No other mass lesion. No midline shift. No hydrocephalus. Vascular: No asymmetric hyperdense vessel. Scattered vascular calcifications noted within the carotid siphons. Skull: Left periorbital and facial contusion. Scalp soft tissues otherwise unremarkable. Calvarium intact. Other: Mastoids are clear. CT MAXILLOFACIAL FINDINGS Osseous: Acute comminuted fractures involve the left zygomatic arch without significant displacement. There are acute fractures through the anterior and posterior walls of the left maxillary sinus. Associated fractures of the left orbital floor and lateral wall of the bony left orbit. Constellation consistent with an acute tripod fracture. No significant displacement about the left orbital floor fracture. Right zygomatic arch and maxilla  intact. Pterygoid plates intact. Nasal bones intact. Nasal septum bowed to the right but intact. No acute mandibular fracture. Mandibular condyles normally situated. Patient is edentulous. Degenerative changes noted about the temporomandibular joints. Orbits: Globes intact. Patient status post lens extraction bilaterally. Acute fractures of the left orbital floor, lateral wall of the bony left orbit, with extension towards the left orbital roof. No significant displacement at the left orbital floor fracture. Extra-ocular muscles remain normally positioned. No significant retro-orbital hematoma. Bony right orbit intact. Sinuses: Blood largely opacifies the left maxillary sinus. There is herniation of fracture antral fat into the sinus itself (series 315, image 45). Paranasal sinuses are otherwise clear. Soft tissues: Left periorbital and facial contusion. CT CERVICAL SPINE FINDINGS Alignment: Severe kyphotic angulation with exaggeration of the normal cervical lordosis, severely limiting the study. Trace anterolisthesis of C7 on T1. No other obvious listhesis. Skull base and vertebrae: Skullbase intact. Normal C1-2 articulations preserved. Dens intact. Vertebral body heights maintained. No obvious fracture. Soft tissues and spinal canal: Visualized soft tissues of the neck grossly unremarkable. No definite prevertebral edema. Disc levels: Moderate degenerative spondylolysis noted at C6-7. No other  significant degenerative changes. Upper chest: Prominent atheromatous plaque noted within the visualized aorta. Emphysematous changes noted. No apical pneumothorax. Scattered atelectatic changes noted within the visualized lungs. IMPRESSION: CT HEAD: 1. Small volume acute/ early subacute extra-axial hemorrhage overlying the posterior left temporal occipital convexity. No associated mass effect. 2. No other acute intracranial process. 3. Meningiomas positioned at the left frontal convexity and left cerebellopontine angle  without associated edema. 4. Advanced age-related cerebral atrophy. CT MAXILLOFACIAL: 1. Acute left-sided tripod fracture as detailed above. 2. Left periorbital and facial contusion. CT CERVICAL SPINE: 1. No definite acute traumatic injury within the cervical spine. Please note study is markedly limited due to severe kyphotic angulation of the thoracic spine. If there is high clinical suspicion for possible occult cervical spinal injury, a repeat study with improved positioning is recommended. 2. Emphysema. Critical Value/emergent results were called by telephone at the time of interpretation on 05/08/2016 at 4:18 am to Dr. Shirlyn Goltz , who verbally acknowledged these results. Electronically Signed   By: Jeannine Boga M.D.   On: 05/08/2016 04:27   Dg Hand Complete Left  Result Date: 05/08/2016 CLINICAL DATA:  Status post fall, with dorsal left hand laceration and bruising. Initial encounter. EXAM: LEFT HAND - COMPLETE 3+ VIEW COMPARISON:  Left hand radiographs performed 12/27/2015 FINDINGS: There is no evidence of fracture or dislocation. The joint spaces are preserved. The carpal rows demonstrate grossly normal alignment. Subcortical cystic change is noted about the distal ulna. There is mild chronic flattening of the distal radius, with resultant positive ulnar variance. Degenerative change is seen at the radial and ulnar aspect of the carpal rows. There is mild chronic deformity at the base of the first metacarpal. Soft tissue swelling is noted about the dorsum of the hand, with associated laceration. No radiopaque foreign bodies are seen. IMPRESSION: 1. No evidence of acute fracture or dislocation. 2. Degenerative change noted about the radiocarpal joint and carpal rows. 3. No radiopaque foreign bodies seen. Electronically Signed   By: Garald Balding M.D.   On: 05/08/2016 03:15   Ct Maxillofacial Wo Contrast  Result Date: 05/08/2016 CLINICAL DATA:  Initial evaluation for acute trauma, fall. EXAM: CT  HEAD WITHOUT CONTRAST CT MAXILLOFACIAL WITHOUT CONTRAST CT CERVICAL SPINE WITHOUT CONTRAST TECHNIQUE: Multidetector CT imaging of the head, cervical spine, and maxillofacial structures were performed using the standard protocol without intravenous contrast. Multiplanar CT image reconstructions of the cervical spine and maxillofacial structures were also generated. COMPARISON:  Prior CT from 12/27/2015. FINDINGS: CT HEAD FINDINGS Brain: Advanced cerebral atrophy with mild to moderate chronic microvascular ischemic disease. There is subtle hyperdensity overlying the left temporal occipital convexity measuring up to 6 mm, suspicious for small amount of extra-axial hemorrhage (series 201, image 12). No other acute intracranial hemorrhage. No evidence for acute large vessel territory infarct. 2.6 cm meningioma overlies the left frontal convexity (series 201, image 28). Additional partially calcified 1.8 cm meningioma present at the left cerebellopontine angle (series 201, image 9). No associated mass effect or edema. No other mass lesion. No midline shift. No hydrocephalus. Vascular: No asymmetric hyperdense vessel. Scattered vascular calcifications noted within the carotid siphons. Skull: Left periorbital and facial contusion. Scalp soft tissues otherwise unremarkable. Calvarium intact. Other: Mastoids are clear. CT MAXILLOFACIAL FINDINGS Osseous: Acute comminuted fractures involve the left zygomatic arch without significant displacement. There are acute fractures through the anterior and posterior walls of the left maxillary sinus. Associated fractures of the left orbital floor and lateral wall of the bony left orbit.  Constellation consistent with an acute tripod fracture. No significant displacement about the left orbital floor fracture. Right zygomatic arch and maxilla intact. Pterygoid plates intact. Nasal bones intact. Nasal septum bowed to the right but intact. No acute mandibular fracture. Mandibular condyles  normally situated. Patient is edentulous. Degenerative changes noted about the temporomandibular joints. Orbits: Globes intact. Patient status post lens extraction bilaterally. Acute fractures of the left orbital floor, lateral wall of the bony left orbit, with extension towards the left orbital roof. No significant displacement at the left orbital floor fracture. Extra-ocular muscles remain normally positioned. No significant retro-orbital hematoma. Bony right orbit intact. Sinuses: Blood largely opacifies the left maxillary sinus. There is herniation of fracture antral fat into the sinus itself (series 315, image 45). Paranasal sinuses are otherwise clear. Soft tissues: Left periorbital and facial contusion. CT CERVICAL SPINE FINDINGS Alignment: Severe kyphotic angulation with exaggeration of the normal cervical lordosis, severely limiting the study. Trace anterolisthesis of C7 on T1. No other obvious listhesis. Skull base and vertebrae: Skullbase intact. Normal C1-2 articulations preserved. Dens intact. Vertebral body heights maintained. No obvious fracture. Soft tissues and spinal canal: Visualized soft tissues of the neck grossly unremarkable. No definite prevertebral edema. Disc levels: Moderate degenerative spondylolysis noted at C6-7. No other significant degenerative changes. Upper chest: Prominent atheromatous plaque noted within the visualized aorta. Emphysematous changes noted. No apical pneumothorax. Scattered atelectatic changes noted within the visualized lungs. IMPRESSION: CT HEAD: 1. Small volume acute/ early subacute extra-axial hemorrhage overlying the posterior left temporal occipital convexity. No associated mass effect. 2. No other acute intracranial process. 3. Meningiomas positioned at the left frontal convexity and left cerebellopontine angle without associated edema. 4. Advanced age-related cerebral atrophy. CT MAXILLOFACIAL: 1. Acute left-sided tripod fracture as detailed above. 2. Left  periorbital and facial contusion. CT CERVICAL SPINE: 1. No definite acute traumatic injury within the cervical spine. Please note study is markedly limited due to severe kyphotic angulation of the thoracic spine. If there is high clinical suspicion for possible occult cervical spinal injury, a repeat study with improved positioning is recommended. 2. Emphysema. Critical Value/emergent results were called by telephone at the time of interpretation on 05/08/2016 at 4:18 am to Dr. Shirlyn Goltz , who verbally acknowledged these results. Electronically Signed   By: Jeannine Boga M.D.   On: 05/08/2016 04:27    Review of Systems  Constitutional: Negative for chills and fever.  Eyes: Negative for blurred vision and double vision.  Cardiovascular: Negative for chest pain.  Gastrointestinal: Negative for abdominal pain.  Neurological: Positive for headaches. Negative for sensory change, speech change and loss of consciousness.  All other systems reviewed and are negative.   Blood pressure 121/88, pulse 79, temperature 98.3 F (36.8 C), temperature source Oral, resp. rate 18, weight 59 kg (130 lb 1.1 oz), SpO2 97 %. Physical Exam  Vitals reviewed. Constitutional: She is oriented to person, place, and time. She appears cachectic.  HENT:  Right Ear: External ear normal.  Left Ear: External ear normal.  Mouth/Throat: Oropharynx is clear and moist.  Left facial and periorbital edema and ecchymosis   Eyes: EOM are normal. Pupils are equal, round, and reactive to light. Left conjunctiva has a hemorrhage.  Neck: Neck supple. No spinous process tenderness present.  Cardiovascular: Normal rate and intact distal pulses.  An irregularly irregular rhythm present.  Respiratory: Effort normal and breath sounds normal.  GI: Soft. There is no tenderness.  Musculoskeletal:  Skin tear left hand, has wraps on bilateral le  for edema  Lymphadenopathy:    She has no cervical adenopathy.  Neurological: She is alert  and oriented to person, place, and time.  Skin: Skin is warm and dry.  Psychiatric: She has a normal mood and affect.     Assessment/Plan s/p fall  1. SDH left temporal lobe- neurosurgery to see, will repeat head ct later today, neurologically intact as of now 2. Left tripod fracture- will consult ENT 3. Elevated INR- recommended to ER reverse with [facility] and not vitamin K/ffp  Rosaly Labarbera, MD 05/08/2016, 6:32 AM

## 2016-05-08 NOTE — ED Notes (Signed)
Notified Dr. Grandville Silos patient with 12 beat run of vtach. New order received for 12 lead. Pt sleeping, arouses easily, NAD at present.

## 2016-05-08 NOTE — Progress Notes (Addendum)
Late Entry  Shortly after arrival to unit patient had 4 pauses on the heart monitor, each increasing in time (2.02, 2.10, 2.17, and 2.22 seconds). Patient was Asymptomatic and rate remained controlled while in Afib. BP's remained stable. Dr. Brantley Stage notified. No new orders received.  Joellen Jersey, RN

## 2016-05-08 NOTE — ED Provider Notes (Signed)
[facility] DEPT Provider Note   CSN: [id number] Arrival date & time: 05/08/16  0040   By signing my name below, I, Delton Prairie, attest that this documentation has been prepared under the direction and in the presence of Drenda Freeze, MD  Electronically Signed: Delton Prairie, ED Scribe. 05/08/16. 2:18 AM.   History   Chief Complaint Chief Complaint  Patient presents with  . Fall    HPI Comments:  Leah [name] is a 83 y.o. female who presents to the Emergency Department complaining of acute onset, moderate left eye pain s/p a fall which occurred 1.5 hours ago. Pt states she was sitting in a chair when she was reaching for a glass of water and fell face forward hitting her head. Pt also has associated skin tears and bruising under her left eye. Relative also reports left jaw swelling. No alleviating factors noted. Pt denies LOC, abdominal pain or any other associated symptoms. Pt states she is on coumadin for heart issues and notes her last INR was therapeutic 2 weeks ago. No other complaints noted at this time.    The history is provided by the patient. No language interpreter was used.     Past Medical History:  Diagnosis Date  . Coronary artery disease   . Hypertension   . Hypothyroidism   . Panic attacks   . RLS (restless legs syndrome)   . Thyroid disease   . Vitamin D deficiency     Patient Active Problem List   Diagnosis Date Noted  . Dehydration   . Diarrhea 11/21/2015  . Hypokalemia due to loss of potassium 11/21/2015  . Hypothyroidism 11/21/2015  . Hypertension 11/21/2015  . CAD (coronary artery disease) 11/21/2015  . Panic attacks 11/21/2015  . Restless legs syndrome 11/21/2015    Past Surgical History:  Procedure Laterality Date  . JOINT REPLACEMENT      OB History    No data available       Home Medications    Prior to Admission medications   Medication Sig Start Date End Date Taking? Authorizing Provider  albuterol (PROVENTIL  HFA;VENTOLIN HFA) 108 (90 Base) MCG/ACT inhaler Inhale 2 puffs into the lungs every 6 (six) hours as needed for wheezing or shortness of breath. 11/23/15   Geradine Girt, DO  ALPRAZolam (XANAX) 0.5 MG tablet Take 0.5-1 mg by mouth See admin instructions. Take 1 tablet (0.5 mg) by mouth every morning and 2 tablets (1 mg) every night    Historical Provider, MD  aspirin EC 81 MG tablet Take 81 mg by mouth daily.    Historical Provider, MD  diltiazem (CARTIA XT) 240 MG 24 hr capsule Take 240 mg by mouth daily.    Historical Provider, MD  DULoxetine (CYMBALTA) 30 MG capsule Take 30 mg by mouth 3 (three) times daily.    Historical Provider, MD  fentaNYL (DURAGESIC - DOSED MCG/HR) 75 MCG/HR Place 75 mcg onto the skin every 3 (three) days.  11/16/15   Historical Provider, MD  furosemide (LASIX) 80 MG tablet Take 80 mg by mouth daily as needed for fluid or edema (swelling).     Historical Provider, MD  HYDROcodone-acetaminophen (NORCO) 10-325 MG per tablet Take 1-2 tablets by mouth every 4 (four) hours as needed for moderate pain (pain).     Historical Provider, MD  levothyroxine (SYNTHROID, LEVOTHROID) 137 MCG tablet Take 137 mcg by mouth daily before breakfast.    Historical Provider, MD  magnesium chloride (SLOW-MAG) 64 MG TBEC SR tablet  Take 1 tablet (64 mg total) by mouth daily. 11/24/15   Geradine Girt, DO  potassium chloride (KLOR-CON M15) 15 MEQ tablet Take 2 tablets (30 mEq total) by mouth daily. USE WHEN TAKING LASIX 11/24/15   Geradine Girt, DO  saccharomyces boulardii (FLORASTOR) 250 MG capsule Take 250 mg by mouth 2 (two) times daily.    Historical Provider, MD  senna-docusate (SENOKOT-S) 8.6-50 MG tablet Take 1 tablet by mouth at bedtime as needed for mild constipation. 11/23/15   Geradine Girt, DO  tamsulosin (FLOMAX) 0.4 MG CAPS capsule Take 0.4 mg by mouth daily.    Historical Provider, MD  warfarin (COUMADIN) 2.5 MG tablet Take 2.5-5 mg by mouth See admin instructions. Take 2 tablets (5 mg)  by mouth Monday and Thursday at 4pm, take 1 tablet (2.5 mg) Sunday, Tuesday, Wednesday, Friday, Saturday at [place] Provider, MD    Family History History reviewed. No pertinent family history.  Social History Social History  Substance Use Topics  . Smoking status: Former Smoker    Quit date: 02/13/1966  . Smokeless tobacco: Never Used  . Alcohol use No     Allergies   Cefdinir and Codeine   Review of Systems Review of Systems  HENT: Positive for facial swelling.   Skin: Positive for color change (ecchymosis ) and wound.  Neurological: Negative for syncope.  All other systems reviewed and are negative.   Physical Exam Updated Vital Signs BP (!) 162/102   Pulse 63   Temp 98.4 F (36.9 C) (Oral)   Resp 16   SpO2 93%   Physical Exam  Constitutional: She is oriented to person, place, and time. She appears well-developed and well-nourished. No distress.  HENT:  Left jaw swelling. No obvious deformity to teeth. Normal bite. No missing teeth. 1 cm laceration to the left temple area.   Eyes: EOM are normal.  Left subconjunctival hemorrhage and ecchymosis underneath the left eye.   Neck: Normal range of motion.  Cardiovascular: Normal rate, regular rhythm and normal heart sounds.   Pulmonary/Chest: Effort normal and breath sounds normal.  Abdominal: Soft. She exhibits no distension. There is no tenderness.  Musculoskeletal: Normal range of motion.  No spinal tenderness.   Neurological: She is alert and oriented to person, place, and time.  Skin: Skin is warm and dry.  Skin tear in dorsal aspect of left hand and left second knuckle.  Psychiatric: She has a normal mood and affect. Judgment normal.  Nursing note and vitals reviewed.    ED Treatments / Results  DIAGNOSTIC STUDIES:  Oxygen Saturation is 93% on RA, low by my interpretation.    COORDINATION OF CARE:  2:15 AM Discussed treatment plan with pt at bedside and pt agreed to plan.  Labs (all labs  ordered are listed, but only abnormal results are displayed) Labs Reviewed - No data to display  EKG  EKG Interpretation None       Radiology No results found.  Procedures Procedures (including critical care time)  CRITICAL CARE Performed by: Wandra Arthurs    Total critical care time: 30 minutes  Critical care time was exclusive of separately billable procedures and treating other patients.  Critical care was necessary to treat or prevent imminent or life-threatening deterioration.  Critical care was time spent personally by me on the following activities: development of treatment plan with patient and/or surrogate as well as nursing, discussions with consultants, evaluation of patient's response to treatment, examination of  patient, obtaining history from patient or surrogate, ordering and performing treatments and interventions, ordering and review of laboratory studies, ordering and review of radiographic studies, pulse oximetry and re-evaluation of patient's condition.   Medications Ordered in ED Medications - No data to display   Initial Impression / Assessment and Plan / ED Course  I have reviewed the triage vital signs and the nursing notes.  Pertinent labs & imaging results that were available during my care of the patient were reviewed by me and considered in my medical decision making (see chart for details).     Leah [name] is a 83 y.o. female here with fall. Mechanical fall, has impressive L facial swelling and subconjunctival hemorrhage. Also has skin tears L hand. She is on coumadin. Will get labs, INR, CT head/neck/face, xrays.   4:30 am CT showed L temporal area subdural hemorrhage, also has L tripod fracture. I called Dr. Ellene Route from neurosurgery. He recommend reversal and trauma admission.   5 am I talked to pharmacy and Dr. Donne Hazel. INR 2.7. Initially ordered vit K oral and FFP but trauma recommend Kcentra. I ordered Kcentra. Trauma to see and admit  patient. Dr. Ellene Route will also see patient as Optometrist.    Final Clinical Impressions(s) / ED Diagnoses   Final diagnoses:  None    New Prescriptions New Prescriptions   No medications on file  I personally performed the services described in this documentation, which was scribed in my presence. The recorded information has been reviewed and is accurate.     Drenda Freeze, MD 05/08/16 [phone]

## 2016-05-08 NOTE — ED Notes (Signed)
Pt ordered clear liquid diet tray. Pt resting comfortably, family at bedside. Updated on plan of care.

## 2016-05-08 NOTE — Consult Note (Signed)
Reason for Consult: Subdural hematoma Referring Physician: Dr. Alphonsus Sias is an 83 y.o. female.  HPI: Leah [name] is an 83 year old individual who apparently had a fall while reaching for glass of water. The fall was from a seated height. She had left side of her head which cause significant bruising. The patient is on Coumadin anticoagulation for atrial fibrillation. She is brought to the emergency department for evaluation and the CT scan shows a small extra-axial blood collection. She also has evidence of a tripod fracture on the left side. There was no apparent loss of consciousness. She is seen in the emergency department with her son. The patient relates that it was her birthday yesterday when this occurred. She has been functioning independently up until this point. The patient also notes that she has chronic double vision which she does not feel has worsened.  Past Medical History:  Diagnosis Date  . Coronary artery disease   . Hypertension   . Hypothyroidism   . Panic attacks   . RLS (restless legs syndrome)   . Thyroid disease   . Vitamin D deficiency     Past Surgical History:  Procedure Laterality Date  . JOINT REPLACEMENT      History reviewed. No pertinent family history.  Social History:  reports that she quit smoking about 50 years ago. She has never used smokeless tobacco. She reports that she does not drink alcohol or use drugs.  Allergies:  Allergies  Allergen Reactions  . Cefdinir Diarrhea  . Codeine Other (See Comments)    nervous    Medications: I have reviewed the patient's current medications.  Results for orders placed or performed during the hospital encounter of 05/08/16 (from the past 48 hour(s))  CBC with Differential/Platelet     Status: Abnormal   Collection Time: 05/08/16  2:23 AM  Result Value Ref Range   WBC 6.4 4.0 - 10.5 K/uL   RBC 4.00 3.87 - 5.11 MIL/uL   Hemoglobin 11.4 (L) 12.0 - 15.0 g/dL   HCT 35.7 (L) 36.0 - 46.0 %   MCV  89.3 78.0 - 100.0 fL   MCH 28.5 26.0 - 34.0 pg   MCHC 31.9 30.0 - 36.0 g/dL   RDW 14.6 11.5 - 15.5 %   Platelets 183 150 - 400 K/uL   Neutrophils Relative % 62 %   Neutro Abs 4.0 1.7 - 7.7 K/uL   Lymphocytes Relative 24 %   Lymphs Abs 1.5 0.7 - 4.0 K/uL   Monocytes Relative 11 %   Monocytes Absolute 0.7 0.1 - 1.0 K/uL   Eosinophils Relative 3 %   Eosinophils Absolute 0.2 0.0 - 0.7 K/uL   Basophils Relative 0 %   Basophils Absolute 0.0 0.0 - 0.1 K/uL  Basic metabolic panel     Status: Abnormal   Collection Time: 05/08/16  2:23 AM  Result Value Ref Range   Sodium 138 135 - 145 mmol/L   Potassium 3.6 3.5 - 5.1 mmol/L   Chloride 97 (L) 101 - 111 mmol/L   CO2 32 22 - 32 mmol/L   Glucose, Bld 95 65 - 99 mg/dL   BUN 19 6 - 20 mg/dL   Creatinine, Ser 0.86 0.44 - 1.00 mg/dL   Calcium 10.1 8.9 - 10.3 mg/dL   GFR calc non Af Amer 59 (L) >60 mL/min   GFR calc Af Amer >60 >60 mL/min    Comment: (NOTE) The eGFR has been calculated using the CKD EPI equation. This calculation  has not been validated in all clinical situations. eGFR's persistently <60 mL/min signify possible Chronic Kidney Disease.    Anion gap 9 5 - 15  Protime-INR     Status: Abnormal   Collection Time: 05/08/16  2:23 AM  Result Value Ref Range   Prothrombin Time 29.7 (H) 11.4 - 15.2 seconds   INR 2.75   ABO/Rh     Status: None   Collection Time: 05/08/16  2:23 AM  Result Value Ref Range   ABO/RH(D) A POS   Prepare fresh frozen plasma     Status: None (Preliminary result)   Collection Time: 05/08/16  4:42 AM  Result Value Ref Range   Unit Number [id number]    Blood Component Type THWPLS APHR2    Unit division 00    Status of Unit ISSUED    Transfusion Status OK TO TRANSFUSE    Unit Number [id number]    Blood Component Type THWPLS APHR2    Unit division 00    Status of Unit ALLOCATED    Transfusion Status OK TO TRANSFUSE     Ct Head Wo Contrast  Result Date: 05/08/2016 CLINICAL DATA:  Initial  evaluation for acute trauma, fall. EXAM: CT HEAD WITHOUT CONTRAST CT MAXILLOFACIAL WITHOUT CONTRAST CT CERVICAL SPINE WITHOUT CONTRAST TECHNIQUE: Multidetector CT imaging of the head, cervical spine, and maxillofacial structures were performed using the standard protocol without intravenous contrast. Multiplanar CT image reconstructions of the cervical spine and maxillofacial structures were also generated. COMPARISON:  Prior CT from 12/27/2015. FINDINGS: CT HEAD FINDINGS Brain: Advanced cerebral atrophy with mild to moderate chronic microvascular ischemic disease. There is subtle hyperdensity overlying the left temporal occipital convexity measuring up to 6 mm, suspicious for small amount of extra-axial hemorrhage (series 201, image 12). No other acute intracranial hemorrhage. No evidence for acute large vessel territory infarct. 2.6 cm meningioma overlies the left frontal convexity (series 201, image 28). Additional partially calcified 1.8 cm meningioma present at the left cerebellopontine angle (series 201, image 9). No associated mass effect or edema. No other mass lesion. No midline shift. No hydrocephalus. Vascular: No asymmetric hyperdense vessel. Scattered vascular calcifications noted within the carotid siphons. Skull: Left periorbital and facial contusion. Scalp soft tissues otherwise unremarkable. Calvarium intact. Other: Mastoids are clear. CT MAXILLOFACIAL FINDINGS Osseous: Acute comminuted fractures involve the left zygomatic arch without significant displacement. There are acute fractures through the anterior and posterior walls of the left maxillary sinus. Associated fractures of the left orbital floor and lateral wall of the bony left orbit. Constellation consistent with an acute tripod fracture. No significant displacement about the left orbital floor fracture. Right zygomatic arch and maxilla intact. Pterygoid plates intact. Nasal bones intact. Nasal septum bowed to the right but intact. No acute  mandibular fracture. Mandibular condyles normally situated. Patient is edentulous. Degenerative changes noted about the temporomandibular joints. Orbits: Globes intact. Patient status post lens extraction bilaterally. Acute fractures of the left orbital floor, lateral wall of the bony left orbit, with extension towards the left orbital roof. No significant displacement at the left orbital floor fracture. Extra-ocular muscles remain normally positioned. No significant retro-orbital hematoma. Bony right orbit intact. Sinuses: Blood largely opacifies the left maxillary sinus. There is herniation of fracture antral fat into the sinus itself (series 315, image 45). Paranasal sinuses are otherwise clear. Soft tissues: Left periorbital and facial contusion. CT CERVICAL SPINE FINDINGS Alignment: Severe kyphotic angulation with exaggeration of the normal cervical lordosis, severely limiting the study. Trace anterolisthesis of C7 on  T1. No other obvious listhesis. Skull base and vertebrae: Skullbase intact. Normal C1-2 articulations preserved. Dens intact. Vertebral body heights maintained. No obvious fracture. Soft tissues and spinal canal: Visualized soft tissues of the neck grossly unremarkable. No definite prevertebral edema. Disc levels: Moderate degenerative spondylolysis noted at C6-7. No other significant degenerative changes. Upper chest: Prominent atheromatous plaque noted within the visualized aorta. Emphysematous changes noted. No apical pneumothorax. Scattered atelectatic changes noted within the visualized lungs. IMPRESSION: CT HEAD: 1. Small volume acute/ early subacute extra-axial hemorrhage overlying the posterior left temporal occipital convexity. No associated mass effect. 2. No other acute intracranial process. 3. Meningiomas positioned at the left frontal convexity and left cerebellopontine angle without associated edema. 4. Advanced age-related cerebral atrophy. CT MAXILLOFACIAL: 1. Acute left-sided  tripod fracture as detailed above. 2. Left periorbital and facial contusion. CT CERVICAL SPINE: 1. No definite acute traumatic injury within the cervical spine. Please note study is markedly limited due to severe kyphotic angulation of the thoracic spine. If there is high clinical suspicion for possible occult cervical spinal injury, a repeat study with improved positioning is recommended. 2. Emphysema. Critical Value/emergent results were called by telephone at the time of interpretation on 05/08/2016 at 4:18 am to Dr. Chaney Malling , who verbally acknowledged these results. Electronically Signed   By: Rise Mu M.D.   On: 05/08/2016 04:27   Ct Cervical Spine Wo Contrast  Result Date: 05/08/2016 CLINICAL DATA:  Initial evaluation for acute trauma, fall. EXAM: CT HEAD WITHOUT CONTRAST CT MAXILLOFACIAL WITHOUT CONTRAST CT CERVICAL SPINE WITHOUT CONTRAST TECHNIQUE: Multidetector CT imaging of the head, cervical spine, and maxillofacial structures were performed using the standard protocol without intravenous contrast. Multiplanar CT image reconstructions of the cervical spine and maxillofacial structures were also generated. COMPARISON:  Prior CT from 12/27/2015. FINDINGS: CT HEAD FINDINGS Brain: Advanced cerebral atrophy with mild to moderate chronic microvascular ischemic disease. There is subtle hyperdensity overlying the left temporal occipital convexity measuring up to 6 mm, suspicious for small amount of extra-axial hemorrhage (series 201, image 12). No other acute intracranial hemorrhage. No evidence for acute large vessel territory infarct. 2.6 cm meningioma overlies the left frontal convexity (series 201, image 28). Additional partially calcified 1.8 cm meningioma present at the left cerebellopontine angle (series 201, image 9). No associated mass effect or edema. No other mass lesion. No midline shift. No hydrocephalus. Vascular: No asymmetric hyperdense vessel. Scattered vascular calcifications  noted within the carotid siphons. Skull: Left periorbital and facial contusion. Scalp soft tissues otherwise unremarkable. Calvarium intact. Other: Mastoids are clear. CT MAXILLOFACIAL FINDINGS Osseous: Acute comminuted fractures involve the left zygomatic arch without significant displacement. There are acute fractures through the anterior and posterior walls of the left maxillary sinus. Associated fractures of the left orbital floor and lateral wall of the bony left orbit. Constellation consistent with an acute tripod fracture. No significant displacement about the left orbital floor fracture. Right zygomatic arch and maxilla intact. Pterygoid plates intact. Nasal bones intact. Nasal septum bowed to the right but intact. No acute mandibular fracture. Mandibular condyles normally situated. Patient is edentulous. Degenerative changes noted about the temporomandibular joints. Orbits: Globes intact. Patient status post lens extraction bilaterally. Acute fractures of the left orbital floor, lateral wall of the bony left orbit, with extension towards the left orbital roof. No significant displacement at the left orbital floor fracture. Extra-ocular muscles remain normally positioned. No significant retro-orbital hematoma. Bony right orbit intact. Sinuses: Blood largely opacifies the left maxillary sinus. There is herniation  of fracture antral fat into the sinus itself (series 315, image 45). Paranasal sinuses are otherwise clear. Soft tissues: Left periorbital and facial contusion. CT CERVICAL SPINE FINDINGS Alignment: Severe kyphotic angulation with exaggeration of the normal cervical lordosis, severely limiting the study. Trace anterolisthesis of C7 on T1. No other obvious listhesis. Skull base and vertebrae: Skullbase intact. Normal C1-2 articulations preserved. Dens intact. Vertebral body heights maintained. No obvious fracture. Soft tissues and spinal canal: Visualized soft tissues of the neck grossly unremarkable.  No definite prevertebral edema. Disc levels: Moderate degenerative spondylolysis noted at C6-7. No other significant degenerative changes. Upper chest: Prominent atheromatous plaque noted within the visualized aorta. Emphysematous changes noted. No apical pneumothorax. Scattered atelectatic changes noted within the visualized lungs. IMPRESSION: CT HEAD: 1. Small volume acute/ early subacute extra-axial hemorrhage overlying the posterior left temporal occipital convexity. No associated mass effect. 2. No other acute intracranial process. 3. Meningiomas positioned at the left frontal convexity and left cerebellopontine angle without associated edema. 4. Advanced age-related cerebral atrophy. CT MAXILLOFACIAL: 1. Acute left-sided tripod fracture as detailed above. 2. Left periorbital and facial contusion. CT CERVICAL SPINE: 1. No definite acute traumatic injury within the cervical spine. Please note study is markedly limited due to severe kyphotic angulation of the thoracic spine. If there is high clinical suspicion for possible occult cervical spinal injury, a repeat study with improved positioning is recommended. 2. Emphysema. Critical Value/emergent results were called by telephone at the time of interpretation on 05/08/2016 at 4:18 am to Dr. Chaney Malling , who verbally acknowledged these results. Electronically Signed   By: Rise Mu M.D.   On: 05/08/2016 04:27   Dg Hand Complete Left  Result Date: 05/08/2016 CLINICAL DATA:  Status post fall, with dorsal left hand laceration and bruising. Initial encounter. EXAM: LEFT HAND - COMPLETE 3+ VIEW COMPARISON:  Left hand radiographs performed 12/27/2015 FINDINGS: There is no evidence of fracture or dislocation. The joint spaces are preserved. The carpal rows demonstrate grossly normal alignment. Subcortical cystic change is noted about the distal ulna. There is mild chronic flattening of the distal radius, with resultant positive ulnar variance. Degenerative  change is seen at the radial and ulnar aspect of the carpal rows. There is mild chronic deformity at the base of the first metacarpal. Soft tissue swelling is noted about the dorsum of the hand, with associated laceration. No radiopaque foreign bodies are seen. IMPRESSION: 1. No evidence of acute fracture or dislocation. 2. Degenerative change noted about the radiocarpal joint and carpal rows. 3. No radiopaque foreign bodies seen. Electronically Signed   By: Roanna Raider M.D.   On: 05/08/2016 03:15   Ct Maxillofacial Wo Contrast  Result Date: 05/08/2016 CLINICAL DATA:  Initial evaluation for acute trauma, fall. EXAM: CT HEAD WITHOUT CONTRAST CT MAXILLOFACIAL WITHOUT CONTRAST CT CERVICAL SPINE WITHOUT CONTRAST TECHNIQUE: Multidetector CT imaging of the head, cervical spine, and maxillofacial structures were performed using the standard protocol without intravenous contrast. Multiplanar CT image reconstructions of the cervical spine and maxillofacial structures were also generated. COMPARISON:  Prior CT from 12/27/2015. FINDINGS: CT HEAD FINDINGS Brain: Advanced cerebral atrophy with mild to moderate chronic microvascular ischemic disease. There is subtle hyperdensity overlying the left temporal occipital convexity measuring up to 6 mm, suspicious for small amount of extra-axial hemorrhage (series 201, image 12). No other acute intracranial hemorrhage. No evidence for acute large vessel territory infarct. 2.6 cm meningioma overlies the left frontal convexity (series 201, image 28). Additional partially calcified 1.8 cm meningioma present  at the left cerebellopontine angle (series 201, image 9). No associated mass effect or edema. No other mass lesion. No midline shift. No hydrocephalus. Vascular: No asymmetric hyperdense vessel. Scattered vascular calcifications noted within the carotid siphons. Skull: Left periorbital and facial contusion. Scalp soft tissues otherwise unremarkable. Calvarium intact. Other:  Mastoids are clear. CT MAXILLOFACIAL FINDINGS Osseous: Acute comminuted fractures involve the left zygomatic arch without significant displacement. There are acute fractures through the anterior and posterior walls of the left maxillary sinus. Associated fractures of the left orbital floor and lateral wall of the bony left orbit. Constellation consistent with an acute tripod fracture. No significant displacement about the left orbital floor fracture. Right zygomatic arch and maxilla intact. Pterygoid plates intact. Nasal bones intact. Nasal septum bowed to the right but intact. No acute mandibular fracture. Mandibular condyles normally situated. Patient is edentulous. Degenerative changes noted about the temporomandibular joints. Orbits: Globes intact. Patient status post lens extraction bilaterally. Acute fractures of the left orbital floor, lateral wall of the bony left orbit, with extension towards the left orbital roof. No significant displacement at the left orbital floor fracture. Extra-ocular muscles remain normally positioned. No significant retro-orbital hematoma. Bony right orbit intact. Sinuses: Blood largely opacifies the left maxillary sinus. There is herniation of fracture antral fat into the sinus itself (series 315, image 45). Paranasal sinuses are otherwise clear. Soft tissues: Left periorbital and facial contusion. CT CERVICAL SPINE FINDINGS Alignment: Severe kyphotic angulation with exaggeration of the normal cervical lordosis, severely limiting the study. Trace anterolisthesis of C7 on T1. No other obvious listhesis. Skull base and vertebrae: Skullbase intact. Normal C1-2 articulations preserved. Dens intact. Vertebral body heights maintained. No obvious fracture. Soft tissues and spinal canal: Visualized soft tissues of the neck grossly unremarkable. No definite prevertebral edema. Disc levels: Moderate degenerative spondylolysis noted at C6-7. No other significant degenerative changes. Upper  chest: Prominent atheromatous plaque noted within the visualized aorta. Emphysematous changes noted. No apical pneumothorax. Scattered atelectatic changes noted within the visualized lungs. IMPRESSION: CT HEAD: 1. Small volume acute/ early subacute extra-axial hemorrhage overlying the posterior left temporal occipital convexity. No associated mass effect. 2. No other acute intracranial process. 3. Meningiomas positioned at the left frontal convexity and left cerebellopontine angle without associated edema. 4. Advanced age-related cerebral atrophy. CT MAXILLOFACIAL: 1. Acute left-sided tripod fracture as detailed above. 2. Left periorbital and facial contusion. CT CERVICAL SPINE: 1. No definite acute traumatic injury within the cervical spine. Please note study is markedly limited due to severe kyphotic angulation of the thoracic spine. If there is high clinical suspicion for possible occult cervical spinal injury, a repeat study with improved positioning is recommended. 2. Emphysema. Critical Value/emergent results were called by telephone at the time of interpretation on 05/08/2016 at 4:18 am to Dr. Shirlyn Goltz , who verbally acknowledged these results. Electronically Signed   By: Jeannine Boga M.D.   On: 05/08/2016 04:27    Review of Systems  Constitutional: Negative.   HENT: Negative.   Eyes:       Chronic double vision  Cardiovascular: Negative.        Chronic atrial fibrillation  Gastrointestinal: Negative.   Genitourinary: Negative.   Musculoskeletal: Negative.   Skin: Negative.   Neurological: Negative.   Endo/Heme/Allergies: Negative.   Psychiatric/Behavioral: Negative.    Blood pressure 107/76, pulse 75, temperature 98.9 F (37.2 C), temperature source Oral, resp. rate 17, weight 59 kg (130 lb 1.1 oz), SpO2 96 %. Physical Exam  Constitutional: She is oriented  to person, place, and time. She appears well-developed.  Frail appearing elderly lady with severe kyphosis  HENT:  Left  frontal periorbital hematoma and ecchymosis about the orbit and zygoma  Eyes: Pupils are equal, round, and reactive to light.  The extraocular movements are full. Though the patient notes double vision in all extremes. The face is symmetric to grimace tongue and uvula in the midline.  Neck: Neck supple.  GI: Soft.  Neurological: She is alert and oriented to person, place, and time.  Moves all 4 extremities well  Skin: Skin is warm and dry.  Psychiatric: She has a normal mood and affect. Her behavior is normal. Judgment and thought content normal.    Assessment/Plan: Small acute subdural hematoma in a patient with substantial atrophy and incidental cerebellopontine meningioma. No neurosurgical intervention will likely be required for this process. The patient's anticoagulation should be stopped at this time. I do not believe that aggressive reversal needs to be undertaken. Her situation can be observed conservatively.  Patryck Kilgore J 05/08/2016, 6:54 AM

## 2016-05-08 NOTE — ED Notes (Signed)
Attempted report RN unable to take report due to patient having active order for q1 neuro checks. Paging trauma MD

## 2016-05-08 NOTE — ED Notes (Signed)
Order modified to Q2 neuro checks, VO Dr. Hulen Skains

## 2016-05-08 NOTE — ED Notes (Signed)
Discontinued FFP per verbal order Dr. Donne Hazel.

## 2016-05-08 NOTE — ED Notes (Signed)
Clear liquid diet ordered.

## 2016-05-08 NOTE — Consult Note (Signed)
ENT/FACIAL TRAUMA CONSULT:  Reason for Consult: Left Facial Fracture Referring Physician: Westminster E Apple is an 83 y.o. female.  HPI: Patient presents to the [facility] emergency department after suffering a facial injury. The patient has a history of atrial fibrillation and is on chronic Coumadin therapy. No apparent loss of consciousness. The patient and her family report a fall in the home, no significant prior history of facial injury or recurrent infection.  Past Medical History:  Diagnosis Date  . Coronary artery disease   . Hypertension   . Hypothyroidism   . Panic attacks   . RLS (restless legs syndrome)   . Thyroid disease   . Vitamin D deficiency     Past Surgical History:  Procedure Laterality Date  . JOINT REPLACEMENT      History reviewed. No pertinent family history.  Social History:  reports that she quit smoking about 50 years ago. She has never used smokeless tobacco. She reports that she does not drink alcohol or use drugs.  Allergies:  Allergies  Allergen Reactions  . Cefdinir Diarrhea  . Codeine Other (See Comments)    nervous    Medications: I have reviewed the patient's current medications.  Results for orders placed or performed during the hospital encounter of 05/08/16 (from the past 48 hour(s))  CBC with Differential/Platelet     Status: Abnormal   Collection Time: 05/08/16  2:23 AM  Result Value Ref Range   WBC 6.4 4.0 - 10.5 K/uL   RBC 4.00 3.87 - 5.11 MIL/uL   Hemoglobin 11.4 (L) 12.0 - 15.0 g/dL   HCT 35.7 (L) 36.0 - 46.0 %   MCV 89.3 78.0 - 100.0 fL   MCH 28.5 26.0 - 34.0 pg   MCHC 31.9 30.0 - 36.0 g/dL   RDW 14.6 11.5 - 15.5 %   Platelets 183 150 - 400 K/uL   Neutrophils Relative % 62 %   Neutro Abs 4.0 1.7 - 7.7 K/uL   Lymphocytes Relative 24 %   Lymphs Abs 1.5 0.7 - 4.0 K/uL   Monocytes Relative 11 %   Monocytes Absolute 0.7 0.1 - 1.0 K/uL   Eosinophils Relative 3 %   Eosinophils Absolute 0.2 0.0 - 0.7 K/uL    Basophils Relative 0 %   Basophils Absolute 0.0 0.0 - 0.1 K/uL  Basic metabolic panel     Status: Abnormal   Collection Time: 05/08/16  2:23 AM  Result Value Ref Range   Sodium 138 135 - 145 mmol/L   Potassium 3.6 3.5 - 5.1 mmol/L   Chloride 97 (L) 101 - 111 mmol/L   CO2 32 22 - 32 mmol/L   Glucose, Bld 95 65 - 99 mg/dL   BUN 19 6 - 20 mg/dL   Creatinine, Ser 0.86 0.44 - 1.00 mg/dL   Calcium 10.1 8.9 - 10.3 mg/dL   GFR calc non Af Amer 59 (L) >60 mL/min   GFR calc Af Amer >60 >60 mL/min    Comment: (NOTE) The eGFR has been calculated using the CKD EPI equation. This calculation has not been validated in all clinical situations. eGFR's persistently <60 mL/min signify possible Chronic Kidney Disease.    Anion gap 9 5 - 15  Protime-INR     Status: Abnormal   Collection Time: 05/08/16  2:23 AM  Result Value Ref Range   Prothrombin Time 29.7 (H) 11.4 - 15.2 seconds   INR 2.75   ABO/Rh     Status: None  Collection Time: 05/08/16  2:23 AM  Result Value Ref Range   ABO/RH(D) A POS   Prepare fresh frozen plasma     Status: None (Preliminary result)   Collection Time: 05/08/16  4:42 AM  Result Value Ref Range   Unit Number [id number]    Blood Component Type THWPLS APHR2    Unit division 00    Status of Unit ISSUED    Transfusion Status OK TO TRANSFUSE    Unit Number [id number]    Blood Component Type THWPLS APHR2    Unit division 00    Status of Unit ALLOCATED    Transfusion Status OK TO TRANSFUSE   Protime-INR     Status: Abnormal   Collection Time: 05/08/16  7:11 AM  Result Value Ref Range   Prothrombin Time 16.8 (H) 11.4 - 15.2 seconds   INR 1.35     Ct Head Wo Contrast  Result Date: 05/08/2016 CLINICAL DATA:  Initial evaluation for acute trauma, fall. EXAM: CT HEAD WITHOUT CONTRAST CT MAXILLOFACIAL WITHOUT CONTRAST CT CERVICAL SPINE WITHOUT CONTRAST TECHNIQUE: Multidetector CT imaging of the head, cervical spine, and maxillofacial structures were performed using  the standard protocol without intravenous contrast. Multiplanar CT image reconstructions of the cervical spine and maxillofacial structures were also generated. COMPARISON:  Prior CT from 12/27/2015. FINDINGS: CT HEAD FINDINGS Brain: Advanced cerebral atrophy with mild to moderate chronic microvascular ischemic disease. There is subtle hyperdensity overlying the left temporal occipital convexity measuring up to 6 mm, suspicious for small amount of extra-axial hemorrhage (series 201, image 12). No other acute intracranial hemorrhage. No evidence for acute large vessel territory infarct. 2.6 cm meningioma overlies the left frontal convexity (series 201, image 28). Additional partially calcified 1.8 cm meningioma present at the left cerebellopontine angle (series 201, image 9). No associated mass effect or edema. No other mass lesion. No midline shift. No hydrocephalus. Vascular: No asymmetric hyperdense vessel. Scattered vascular calcifications noted within the carotid siphons. Skull: Left periorbital and facial contusion. Scalp soft tissues otherwise unremarkable. Calvarium intact. Other: Mastoids are clear. CT MAXILLOFACIAL FINDINGS Osseous: Acute comminuted fractures involve the left zygomatic arch without significant displacement. There are acute fractures through the anterior and posterior walls of the left maxillary sinus. Associated fractures of the left orbital floor and lateral wall of the bony left orbit. Constellation consistent with an acute tripod fracture. No significant displacement about the left orbital floor fracture. Right zygomatic arch and maxilla intact. Pterygoid plates intact. Nasal bones intact. Nasal septum bowed to the right but intact. No acute mandibular fracture. Mandibular condyles normally situated. Patient is edentulous. Degenerative changes noted about the temporomandibular joints. Orbits: Globes intact. Patient status post lens extraction bilaterally. Acute fractures of the left  orbital floor, lateral wall of the bony left orbit, with extension towards the left orbital roof. No significant displacement at the left orbital floor fracture. Extra-ocular muscles remain normally positioned. No significant retro-orbital hematoma. Bony right orbit intact. Sinuses: Blood largely opacifies the left maxillary sinus. There is herniation of fracture antral fat into the sinus itself (series 315, image 45). Paranasal sinuses are otherwise clear. Soft tissues: Left periorbital and facial contusion. CT CERVICAL SPINE FINDINGS Alignment: Severe kyphotic angulation with exaggeration of the normal cervical lordosis, severely limiting the study. Trace anterolisthesis of C7 on T1. No other obvious listhesis. Skull base and vertebrae: Skullbase intact. Normal C1-2 articulations preserved. Dens intact. Vertebral body heights maintained. No obvious fracture. Soft tissues and spinal canal: Visualized soft tissues of the neck grossly  unremarkable. No definite prevertebral edema. Disc levels: Moderate degenerative spondylolysis noted at C6-7. No other significant degenerative changes. Upper chest: Prominent atheromatous plaque noted within the visualized aorta. Emphysematous changes noted. No apical pneumothorax. Scattered atelectatic changes noted within the visualized lungs. IMPRESSION: CT HEAD: 1. Small volume acute/ early subacute extra-axial hemorrhage overlying the posterior left temporal occipital convexity. No associated mass effect. 2. No other acute intracranial process. 3. Meningiomas positioned at the left frontal convexity and left cerebellopontine angle without associated edema. 4. Advanced age-related cerebral atrophy. CT MAXILLOFACIAL: 1. Acute left-sided tripod fracture as detailed above. 2. Left periorbital and facial contusion. CT CERVICAL SPINE: 1. No definite acute traumatic injury within the cervical spine. Please note study is markedly limited due to severe kyphotic angulation of the thoracic  spine. If there is high clinical suspicion for possible occult cervical spinal injury, a repeat study with improved positioning is recommended. 2. Emphysema. Critical Value/emergent results were called by telephone at the time of interpretation on 05/08/2016 at 4:18 am to Dr. Shirlyn Goltz , who verbally acknowledged these results. Electronically Signed   By: Jeannine Boga M.D.   On: 05/08/2016 04:27   Ct Cervical Spine Wo Contrast  Result Date: 05/08/2016 CLINICAL DATA:  Initial evaluation for acute trauma, fall. EXAM: CT HEAD WITHOUT CONTRAST CT MAXILLOFACIAL WITHOUT CONTRAST CT CERVICAL SPINE WITHOUT CONTRAST TECHNIQUE: Multidetector CT imaging of the head, cervical spine, and maxillofacial structures were performed using the standard protocol without intravenous contrast. Multiplanar CT image reconstructions of the cervical spine and maxillofacial structures were also generated. COMPARISON:  Prior CT from 12/27/2015. FINDINGS: CT HEAD FINDINGS Brain: Advanced cerebral atrophy with mild to moderate chronic microvascular ischemic disease. There is subtle hyperdensity overlying the left temporal occipital convexity measuring up to 6 mm, suspicious for small amount of extra-axial hemorrhage (series 201, image 12). No other acute intracranial hemorrhage. No evidence for acute large vessel territory infarct. 2.6 cm meningioma overlies the left frontal convexity (series 201, image 28). Additional partially calcified 1.8 cm meningioma present at the left cerebellopontine angle (series 201, image 9). No associated mass effect or edema. No other mass lesion. No midline shift. No hydrocephalus. Vascular: No asymmetric hyperdense vessel. Scattered vascular calcifications noted within the carotid siphons. Skull: Left periorbital and facial contusion. Scalp soft tissues otherwise unremarkable. Calvarium intact. Other: Mastoids are clear. CT MAXILLOFACIAL FINDINGS Osseous: Acute comminuted fractures involve the left  zygomatic arch without significant displacement. There are acute fractures through the anterior and posterior walls of the left maxillary sinus. Associated fractures of the left orbital floor and lateral wall of the bony left orbit. Constellation consistent with an acute tripod fracture. No significant displacement about the left orbital floor fracture. Right zygomatic arch and maxilla intact. Pterygoid plates intact. Nasal bones intact. Nasal septum bowed to the right but intact. No acute mandibular fracture. Mandibular condyles normally situated. Patient is edentulous. Degenerative changes noted about the temporomandibular joints. Orbits: Globes intact. Patient status post lens extraction bilaterally. Acute fractures of the left orbital floor, lateral wall of the bony left orbit, with extension towards the left orbital roof. No significant displacement at the left orbital floor fracture. Extra-ocular muscles remain normally positioned. No significant retro-orbital hematoma. Bony right orbit intact. Sinuses: Blood largely opacifies the left maxillary sinus. There is herniation of fracture antral fat into the sinus itself (series 315, image 45). Paranasal sinuses are otherwise clear. Soft tissues: Left periorbital and facial contusion. CT CERVICAL SPINE FINDINGS Alignment: Severe kyphotic angulation with exaggeration of the  normal cervical lordosis, severely limiting the study. Trace anterolisthesis of C7 on T1. No other obvious listhesis. Skull base and vertebrae: Skullbase intact. Normal C1-2 articulations preserved. Dens intact. Vertebral body heights maintained. No obvious fracture. Soft tissues and spinal canal: Visualized soft tissues of the neck grossly unremarkable. No definite prevertebral edema. Disc levels: Moderate degenerative spondylolysis noted at C6-7. No other significant degenerative changes. Upper chest: Prominent atheromatous plaque noted within the visualized aorta. Emphysematous changes noted.  No apical pneumothorax. Scattered atelectatic changes noted within the visualized lungs. IMPRESSION: CT HEAD: 1. Small volume acute/ early subacute extra-axial hemorrhage overlying the posterior left temporal occipital convexity. No associated mass effect. 2. No other acute intracranial process. 3. Meningiomas positioned at the left frontal convexity and left cerebellopontine angle without associated edema. 4. Advanced age-related cerebral atrophy. CT MAXILLOFACIAL: 1. Acute left-sided tripod fracture as detailed above. 2. Left periorbital and facial contusion. CT CERVICAL SPINE: 1. No definite acute traumatic injury within the cervical spine. Please note study is markedly limited due to severe kyphotic angulation of the thoracic spine. If there is high clinical suspicion for possible occult cervical spinal injury, a repeat study with improved positioning is recommended. 2. Emphysema. Critical Value/emergent results were called by telephone at the time of interpretation on 05/08/2016 at 4:18 am to Dr. Shirlyn Goltz , who verbally acknowledged these results. Electronically Signed   By: Jeannine Boga M.D.   On: 05/08/2016 04:27   Dg Hand Complete Left  Result Date: 05/08/2016 CLINICAL DATA:  Status post fall, with dorsal left hand laceration and bruising. Initial encounter. EXAM: LEFT HAND - COMPLETE 3+ VIEW COMPARISON:  Left hand radiographs performed 12/27/2015 FINDINGS: There is no evidence of fracture or dislocation. The joint spaces are preserved. The carpal rows demonstrate grossly normal alignment. Subcortical cystic change is noted about the distal ulna. There is mild chronic flattening of the distal radius, with resultant positive ulnar variance. Degenerative change is seen at the radial and ulnar aspect of the carpal rows. There is mild chronic deformity at the base of the first metacarpal. Soft tissue swelling is noted about the dorsum of the hand, with associated laceration. No radiopaque foreign  bodies are seen. IMPRESSION: 1. No evidence of acute fracture or dislocation. 2. Degenerative change noted about the radiocarpal joint and carpal rows. 3. No radiopaque foreign bodies seen. Electronically Signed   By: Garald Balding M.D.   On: 05/08/2016 03:15   Ct Maxillofacial Wo Contrast  Result Date: 05/08/2016 CLINICAL DATA:  Initial evaluation for acute trauma, fall. EXAM: CT HEAD WITHOUT CONTRAST CT MAXILLOFACIAL WITHOUT CONTRAST CT CERVICAL SPINE WITHOUT CONTRAST TECHNIQUE: Multidetector CT imaging of the head, cervical spine, and maxillofacial structures were performed using the standard protocol without intravenous contrast. Multiplanar CT image reconstructions of the cervical spine and maxillofacial structures were also generated. COMPARISON:  Prior CT from 12/27/2015. FINDINGS: CT HEAD FINDINGS Brain: Advanced cerebral atrophy with mild to moderate chronic microvascular ischemic disease. There is subtle hyperdensity overlying the left temporal occipital convexity measuring up to 6 mm, suspicious for small amount of extra-axial hemorrhage (series 201, image 12). No other acute intracranial hemorrhage. No evidence for acute large vessel territory infarct. 2.6 cm meningioma overlies the left frontal convexity (series 201, image 28). Additional partially calcified 1.8 cm meningioma present at the left cerebellopontine angle (series 201, image 9). No associated mass effect or edema. No other mass lesion. No midline shift. No hydrocephalus. Vascular: No asymmetric hyperdense vessel. Scattered vascular calcifications noted within the carotid  siphons. Skull: Left periorbital and facial contusion. Scalp soft tissues otherwise unremarkable. Calvarium intact. Other: Mastoids are clear. CT MAXILLOFACIAL FINDINGS Osseous: Acute comminuted fractures involve the left zygomatic arch without significant displacement. There are acute fractures through the anterior and posterior walls of the left maxillary sinus.  Associated fractures of the left orbital floor and lateral wall of the bony left orbit. Constellation consistent with an acute tripod fracture. No significant displacement about the left orbital floor fracture. Right zygomatic arch and maxilla intact. Pterygoid plates intact. Nasal bones intact. Nasal septum bowed to the right but intact. No acute mandibular fracture. Mandibular condyles normally situated. Patient is edentulous. Degenerative changes noted about the temporomandibular joints. Orbits: Globes intact. Patient status post lens extraction bilaterally. Acute fractures of the left orbital floor, lateral wall of the bony left orbit, with extension towards the left orbital roof. No significant displacement at the left orbital floor fracture. Extra-ocular muscles remain normally positioned. No significant retro-orbital hematoma. Bony right orbit intact. Sinuses: Blood largely opacifies the left maxillary sinus. There is herniation of fracture antral fat into the sinus itself (series 315, image 45). Paranasal sinuses are otherwise clear. Soft tissues: Left periorbital and facial contusion. CT CERVICAL SPINE FINDINGS Alignment: Severe kyphotic angulation with exaggeration of the normal cervical lordosis, severely limiting the study. Trace anterolisthesis of C7 on T1. No other obvious listhesis. Skull base and vertebrae: Skullbase intact. Normal C1-2 articulations preserved. Dens intact. Vertebral body heights maintained. No obvious fracture. Soft tissues and spinal canal: Visualized soft tissues of the neck grossly unremarkable. No definite prevertebral edema. Disc levels: Moderate degenerative spondylolysis noted at C6-7. No other significant degenerative changes. Upper chest: Prominent atheromatous plaque noted within the visualized aorta. Emphysematous changes noted. No apical pneumothorax. Scattered atelectatic changes noted within the visualized lungs. IMPRESSION: CT HEAD: 1. Small volume acute/ early  subacute extra-axial hemorrhage overlying the posterior left temporal occipital convexity. No associated mass effect. 2. No other acute intracranial process. 3. Meningiomas positioned at the left frontal convexity and left cerebellopontine angle without associated edema. 4. Advanced age-related cerebral atrophy. CT MAXILLOFACIAL: 1. Acute left-sided tripod fracture as detailed above. 2. Left periorbital and facial contusion. CT CERVICAL SPINE: 1. No definite acute traumatic injury within the cervical spine. Please note study is markedly limited due to severe kyphotic angulation of the thoracic spine. If there is high clinical suspicion for possible occult cervical spinal injury, a repeat study with improved positioning is recommended. 2. Emphysema. Critical Value/emergent results were called by telephone at the time of interpretation on 05/08/2016 at 4:18 am to Dr. Shirlyn Goltz , who verbally acknowledged these results. Electronically Signed   By: Jeannine Boga M.D.   On: 05/08/2016 04:27    ROS:ROS 12 systems reviewed and negative except as stated in HPI    Blood pressure 120/78, pulse 78, temperature 97.7 F (36.5 C), temperature source Oral, resp. rate 16, weight 59 kg (130 lb 1.1 oz), SpO2 98 %.  PHYSICAL EXAM: General appearance - alert, well appearing, and in no distress Mental status - alert, oriented to person, place, and time Eyes - moderate left periorbital ecchymosis and edema. Normal extraocular mobility without evidence of entrapment, patient reports mild diplopia in all directions, no change from previous. Nose - normal and patent, no erythema, discharge or polyps and dry nasal crusting without active bleeding. Mouth - mucous membranes moist, pharynx normal without lesions Neck - supple, no significant adenopathy  Studies Reviewed: Maxillofacial CT scan showing minimally displaced left trimalar fracture. Soft  tissue consistent with blood in the left maxillary  sinus.  Assessment/Plan: The patient is evaluated in the [facility] emergency department after suffering a facial trauma. CT scan shows minimally displaced trimalar fracture, physical examination the patient has moderate swelling and ecchymosis involving the left periorbital region, no palpable fracture and no evidence of displacement. She has chronic visual changes and diplopia which is not affected by her recent injury. Given her findings and exam, it is doubtful she will require any surgical intervention. Recommend facial fracture precautions as outlined below, plan follow-up in 3 weeks as an outpatient for recheck or sooner as needed by worsening symptoms. Findings and recommendations were reviewed in detail with the patient and her family and they understand and agree with this plan.  Fracture precautions: 1. Elevate head of bed 2. Ice compress to periorbital region 3. Avoid additional trauma, nose blowing or sneezing 4. Liquid and soft diet as tolerated 5. Saline nasal spray 4 times a day and when necessary    Leah [name] 05/08/2016, 12:34 PM

## 2016-05-08 NOTE — ED Notes (Signed)
Attempted report 

## 2016-05-09 ENCOUNTER — Inpatient Hospital Stay (HOSPITAL_COMMUNITY): Payer: Medicare Other

## 2016-05-09 LAB — BASIC METABOLIC PANEL
Anion gap: 6 (ref 5–15)
BUN: 10 mg/dL (ref 6–20)
CHLORIDE: 104 mmol/L (ref 101–111)
CO2: 32 mmol/L (ref 22–32)
CREATININE: 0.57 mg/dL (ref 0.44–1.00)
Calcium: 9.5 mg/dL (ref 8.9–10.3)
GFR calc Af Amer: >60 mL/min (ref >60)
GFR calc non Af Amer: >60 mL/min (ref >60)
GLUCOSE: 82 mg/dL (ref 65–99)
Potassium: 3.3 mmol/L — ABNORMAL LOW (ref 3.5–5.1)
Sodium: 142 mmol/L (ref 135–145)

## 2016-05-09 LAB — CBC
HEMATOCRIT: 32.3 % — AB (ref 36.0–46.0)
HEMOGLOBIN: 10.3 g/dL — AB (ref 12.0–15.0)
MCH: 28.7 pg (ref 26.0–34.0)
MCHC: 31.9 g/dL (ref 30.0–36.0)
MCV: 90 fL (ref 78.0–100.0)
Platelets: 152 10*3/uL (ref 150–400)
RBC: 3.59 MIL/uL — AB (ref 3.87–5.11)
RDW: 15 % (ref 11.5–15.5)
WBC: 4.8 10*3/uL (ref 4.0–10.5)

## 2016-05-09 LAB — PREPARE FRESH FROZEN PLASMA
Unit division: 0
Unit division: 0

## 2016-05-09 LAB — PROTIME-INR
INR: 1.15
INR: 1.15
Prothrombin Time: 14.8 seconds (ref 11.4–15.2)
Prothrombin Time: 14.7 seconds (ref 11.4–15.2)

## 2016-05-09 LAB — BPAM FFP
BLOOD PRODUCT EXPIRATION DATE: 201803292359
Blood Product Expiration Date: 201803292359
ISSUE DATE / TIME: 201803260551
ISSUE DATE / TIME: 201803270737
UNIT TYPE AND RH: 6200
Unit Type and Rh: 600

## 2016-05-09 MED ORDER — FUROSEMIDE 10 MG/ML IJ SOLN
20.0000 mg | Freq: Once | INTRAMUSCULAR | Status: AC
  Administered 2016-05-09: 20 mg via INTRAVENOUS

## 2016-05-09 MED ORDER — HYDRALAZINE HCL 20 MG/ML IJ SOLN
5.0000 mg | Freq: Four times a day (QID) | INTRAMUSCULAR | Status: DC | PRN
Start: 2016-05-09 — End: 2016-05-17
  Administered 2016-05-15 – 2016-05-17 (×4): 5 mg via INTRAVENOUS
  Administered 2016-05-17: 10 mg via INTRAVENOUS
  Administered 2016-05-17: 5 mg via INTRAVENOUS
  Filled 2016-05-09 (×7): qty 1

## 2016-05-09 MED ORDER — FUROSEMIDE 10 MG/ML IJ SOLN
INTRAMUSCULAR | Status: AC
Start: 2016-05-09 — End: 2016-05-10
  Filled 2016-05-09: qty 4

## 2016-05-09 NOTE — Progress Notes (Signed)
Trauma paged due to pt respiratory distress/tachypnea and oxygen desaturation.  Patient received 20 mg IV lasix and was placed on non-rebreather. O2 Sats improved. Now 98% on nasal cannula and non-labored. Patient also exhibits new confusion and some unintelligible speech.  Transfer to SDU STAT CXR and CT HEAD W/O pending.  Speech eval in AM to r/o aspiration.   Obie Dredge, PA-C [facility] Pager: [phone] Consults: [phone] Mon-Fri 7:00 am-4:30 pm Sat-Sun 7:00 am-11:30 am

## 2016-05-09 NOTE — Progress Notes (Signed)
Report given to [facility] RN.  Joellen Jersey, RN

## 2016-05-09 NOTE — Progress Notes (Addendum)
Trauma team at bedside along with RAPID RESPONSE. Orders received and carried out. Pt is NPO at this time d/t pos aspiration. Bed request for step down. Awaiting for CXR. RAPID RESPONSE at bedside [facility], RN

## 2016-05-09 NOTE — Progress Notes (Signed)
Report given to Angelica on 4E.pt is in CT at this time and will be transport to 4E after CT.  Ave Filter, RN

## 2016-05-09 NOTE — Progress Notes (Signed)
[facility] Progress Note     Subjective: Denies HA or facial pain. Does have some discomfort with eating. Tolerating PO. Denies nausea, vomiting, chest pain, SOB, abdominal pain. Urinating.   Objective: Vital signs in last 24 hours: Temp:  [97.7 F (36.5 C)-98.4 F (36.9 C)] 98.1 F (36.7 C) (03/27 0700) Pulse Rate:  [44-132] 72 (03/27 0700) Resp:  [13-24] 14 (03/27 0700) BP: (96-158)/(57-103) 141/96 (03/27 0700) SpO2:  [87 %-100 %] 96 % (03/27 0700) Weight:  [58.3 kg (128 lb 8.5 oz)] 58.3 kg (128 lb 8.5 oz) (03/26 1504) Last BM Date:  ("about 3-4 days ago" per patient)  Intake/Output from previous day: 03/26 0701 - 03/27 0700 In: 786.7 [P.O.:120; I.V.:666.7] Out: 750 [Urine:750] Intake/Output this shift: No intake/output data recorded.  PE: Gen:  Alert, NAD, pleasant and cooperative HEENT: right periorbital ecchymosis/contusion, pupils equal and round, EOM's in tact  Card:  Irregularly irregular Pulm:  Non-labored, clear to auscultation bilaterally Abd: Soft, non-tender, bowel sounds present in all 4 quadrants,  Neuro: alert and oriented to person and place, not time.   Lab Results:   Recent Labs  05/08/16 0223 05/09/16 0042  WBC 6.4 4.8  HGB 11.4* 10.3*  HCT 35.7* 32.3*  PLT 183 152   BMET  Recent Labs  05/08/16 0223  NA 138  K 3.6  CL 97*  CO2 32  GLUCOSE 95  BUN 19  CREATININE 0.86  CALCIUM 10.1   PT/INR  Recent Labs  05/08/16 1636 05/09/16 0042  LABPROT 15.1 14.8  INR 1.19 1.15   CMP     Component Value Date/Time   NA 138 05/08/2016 0223   K 3.6 05/08/2016 0223   CL 97 (L) 05/08/2016 0223   CO2 32 05/08/2016 0223   GLUCOSE 95 05/08/2016 0223   BUN 19 05/08/2016 0223   CREATININE 0.86 05/08/2016 0223   CALCIUM 10.1 05/08/2016 0223   CALCIUM 10.0 09/15/2010 0530   PROT 6.1 (L) 12/27/2015 0959   ALBUMIN 3.3 (L) 12/27/2015 0959   AST 16 12/27/2015 0959   ALT 10 (L) 12/27/2015 0959   ALKPHOS 112 12/27/2015 0959    BILITOT 0.9 12/27/2015 0959   GFRNONAA 59 (L) 05/08/2016 0223   GFRAA >60 05/08/2016 0223   Lipase     Component Value Date/Time   LIPASE 23 11/21/2015 1343       Studies/Results: Ct Head Wo Contrast  Result Date: 05/08/2016 CLINICAL DATA:  Initial evaluation for acute trauma, fall. EXAM: CT HEAD WITHOUT CONTRAST CT MAXILLOFACIAL WITHOUT CONTRAST CT CERVICAL SPINE WITHOUT CONTRAST TECHNIQUE: Multidetector CT imaging of the head, cervical spine, and maxillofacial structures were performed using the standard protocol without intravenous contrast. Multiplanar CT image reconstructions of the cervical spine and maxillofacial structures were also generated. COMPARISON:  Prior CT from 12/27/2015. FINDINGS: CT HEAD FINDINGS Brain: Advanced cerebral atrophy with mild to moderate chronic microvascular ischemic disease. There is subtle hyperdensity overlying the left temporal occipital convexity measuring up to 6 mm, suspicious for small amount of extra-axial hemorrhage (series 201, image 12). No other acute intracranial hemorrhage. No evidence for acute large vessel territory infarct. 2.6 cm meningioma overlies the left frontal convexity (series 201, image 28). Additional partially calcified 1.8 cm meningioma present at the left cerebellopontine angle (series 201, image 9). No associated mass effect or edema. No other mass lesion. No midline shift. No hydrocephalus. Vascular: No asymmetric hyperdense vessel. Scattered vascular calcifications noted within the carotid siphons. Skull: Left periorbital and facial contusion. Scalp soft tissues  otherwise unremarkable. Calvarium intact. Other: Mastoids are clear. CT MAXILLOFACIAL FINDINGS Osseous: Acute comminuted fractures involve the left zygomatic arch without significant displacement. There are acute fractures through the anterior and posterior walls of the left maxillary sinus. Associated fractures of the left orbital floor and lateral wall of the bony left  orbit. Constellation consistent with an acute tripod fracture. No significant displacement about the left orbital floor fracture. Right zygomatic arch and maxilla intact. Pterygoid plates intact. Nasal bones intact. Nasal septum bowed to the right but intact. No acute mandibular fracture. Mandibular condyles normally situated. Patient is edentulous. Degenerative changes noted about the temporomandibular joints. Orbits: Globes intact. Patient status post lens extraction bilaterally. Acute fractures of the left orbital floor, lateral wall of the bony left orbit, with extension towards the left orbital roof. No significant displacement at the left orbital floor fracture. Extra-ocular muscles remain normally positioned. No significant retro-orbital hematoma. Bony right orbit intact. Sinuses: Blood largely opacifies the left maxillary sinus. There is herniation of fracture antral fat into the sinus itself (series 315, image 45). Paranasal sinuses are otherwise clear. Soft tissues: Left periorbital and facial contusion. CT CERVICAL SPINE FINDINGS Alignment: Severe kyphotic angulation with exaggeration of the normal cervical lordosis, severely limiting the study. Trace anterolisthesis of C7 on T1. No other obvious listhesis. Skull base and vertebrae: Skullbase intact. Normal C1-2 articulations preserved. Dens intact. Vertebral body heights maintained. No obvious fracture. Soft tissues and spinal canal: Visualized soft tissues of the neck grossly unremarkable. No definite prevertebral edema. Disc levels: Moderate degenerative spondylolysis noted at C6-7. No other significant degenerative changes. Upper chest: Prominent atheromatous plaque noted within the visualized aorta. Emphysematous changes noted. No apical pneumothorax. Scattered atelectatic changes noted within the visualized lungs. IMPRESSION: CT HEAD: 1. Small volume acute/ early subacute extra-axial hemorrhage overlying the posterior left temporal occipital  convexity. No associated mass effect. 2. No other acute intracranial process. 3. Meningiomas positioned at the left frontal convexity and left cerebellopontine angle without associated edema. 4. Advanced age-related cerebral atrophy. CT MAXILLOFACIAL: 1. Acute left-sided tripod fracture as detailed above. 2. Left periorbital and facial contusion. CT CERVICAL SPINE: 1. No definite acute traumatic injury within the cervical spine. Please note study is markedly limited due to severe kyphotic angulation of the thoracic spine. If there is high clinical suspicion for possible occult cervical spinal injury, a repeat study with improved positioning is recommended. 2. Emphysema. Critical Value/emergent results were called by telephone at the time of interpretation on 05/08/2016 at 4:18 am to Dr. Shirlyn Goltz , who verbally acknowledged these results. Electronically Signed   By: Jeannine Boga M.D.   On: 05/08/2016 04:27   Ct Cervical Spine Wo Contrast  Result Date: 05/08/2016 CLINICAL DATA:  Initial evaluation for acute trauma, fall. EXAM: CT HEAD WITHOUT CONTRAST CT MAXILLOFACIAL WITHOUT CONTRAST CT CERVICAL SPINE WITHOUT CONTRAST TECHNIQUE: Multidetector CT imaging of the head, cervical spine, and maxillofacial structures were performed using the standard protocol without intravenous contrast. Multiplanar CT image reconstructions of the cervical spine and maxillofacial structures were also generated. COMPARISON:  Prior CT from 12/27/2015. FINDINGS: CT HEAD FINDINGS Brain: Advanced cerebral atrophy with mild to moderate chronic microvascular ischemic disease. There is subtle hyperdensity overlying the left temporal occipital convexity measuring up to 6 mm, suspicious for small amount of extra-axial hemorrhage (series 201, image 12). No other acute intracranial hemorrhage. No evidence for acute large vessel territory infarct. 2.6 cm meningioma overlies the left frontal convexity (series 201, image 28). Additional  partially calcified 1.8  cm meningioma present at the left cerebellopontine angle (series 201, image 9). No associated mass effect or edema. No other mass lesion. No midline shift. No hydrocephalus. Vascular: No asymmetric hyperdense vessel. Scattered vascular calcifications noted within the carotid siphons. Skull: Left periorbital and facial contusion. Scalp soft tissues otherwise unremarkable. Calvarium intact. Other: Mastoids are clear. CT MAXILLOFACIAL FINDINGS Osseous: Acute comminuted fractures involve the left zygomatic arch without significant displacement. There are acute fractures through the anterior and posterior walls of the left maxillary sinus. Associated fractures of the left orbital floor and lateral wall of the bony left orbit. Constellation consistent with an acute tripod fracture. No significant displacement about the left orbital floor fracture. Right zygomatic arch and maxilla intact. Pterygoid plates intact. Nasal bones intact. Nasal septum bowed to the right but intact. No acute mandibular fracture. Mandibular condyles normally situated. Patient is edentulous. Degenerative changes noted about the temporomandibular joints. Orbits: Globes intact. Patient status post lens extraction bilaterally. Acute fractures of the left orbital floor, lateral wall of the bony left orbit, with extension towards the left orbital roof. No significant displacement at the left orbital floor fracture. Extra-ocular muscles remain normally positioned. No significant retro-orbital hematoma. Bony right orbit intact. Sinuses: Blood largely opacifies the left maxillary sinus. There is herniation of fracture antral fat into the sinus itself (series 315, image 45). Paranasal sinuses are otherwise clear. Soft tissues: Left periorbital and facial contusion. CT CERVICAL SPINE FINDINGS Alignment: Severe kyphotic angulation with exaggeration of the normal cervical lordosis, severely limiting the study. Trace anterolisthesis of  C7 on T1. No other obvious listhesis. Skull base and vertebrae: Skullbase intact. Normal C1-2 articulations preserved. Dens intact. Vertebral body heights maintained. No obvious fracture. Soft tissues and spinal canal: Visualized soft tissues of the neck grossly unremarkable. No definite prevertebral edema. Disc levels: Moderate degenerative spondylolysis noted at C6-7. No other significant degenerative changes. Upper chest: Prominent atheromatous plaque noted within the visualized aorta. Emphysematous changes noted. No apical pneumothorax. Scattered atelectatic changes noted within the visualized lungs. IMPRESSION: CT HEAD: 1. Small volume acute/ early subacute extra-axial hemorrhage overlying the posterior left temporal occipital convexity. No associated mass effect. 2. No other acute intracranial process. 3. Meningiomas positioned at the left frontal convexity and left cerebellopontine angle without associated edema. 4. Advanced age-related cerebral atrophy. CT MAXILLOFACIAL: 1. Acute left-sided tripod fracture as detailed above. 2. Left periorbital and facial contusion. CT CERVICAL SPINE: 1. No definite acute traumatic injury within the cervical spine. Please note study is markedly limited due to severe kyphotic angulation of the thoracic spine. If there is high clinical suspicion for possible occult cervical spinal injury, a repeat study with improved positioning is recommended. 2. Emphysema. Critical Value/emergent results were called by telephone at the time of interpretation on 05/08/2016 at 4:18 am to Dr. Shirlyn Goltz , who verbally acknowledged these results. Electronically Signed   By: Jeannine Boga M.D.   On: 05/08/2016 04:27   Dg Hand Complete Left  Result Date: 05/08/2016 CLINICAL DATA:  Status post fall, with dorsal left hand laceration and bruising. Initial encounter. EXAM: LEFT HAND - COMPLETE 3+ VIEW COMPARISON:  Left hand radiographs performed 12/27/2015 FINDINGS: There is no evidence of  fracture or dislocation. The joint spaces are preserved. The carpal rows demonstrate grossly normal alignment. Subcortical cystic change is noted about the distal ulna. There is mild chronic flattening of the distal radius, with resultant positive ulnar variance. Degenerative change is seen at the radial and ulnar aspect of the carpal rows. There  is mild chronic deformity at the base of the first metacarpal. Soft tissue swelling is noted about the dorsum of the hand, with associated laceration. No radiopaque foreign bodies are seen. IMPRESSION: 1. No evidence of acute fracture or dislocation. 2. Degenerative change noted about the radiocarpal joint and carpal rows. 3. No radiopaque foreign bodies seen. Electronically Signed   By: Garald Balding M.D.   On: 05/08/2016 03:15   Ct Maxillofacial Wo Contrast  Result Date: 05/08/2016 CLINICAL DATA:  Initial evaluation for acute trauma, fall. EXAM: CT HEAD WITHOUT CONTRAST CT MAXILLOFACIAL WITHOUT CONTRAST CT CERVICAL SPINE WITHOUT CONTRAST TECHNIQUE: Multidetector CT imaging of the head, cervical spine, and maxillofacial structures were performed using the standard protocol without intravenous contrast. Multiplanar CT image reconstructions of the cervical spine and maxillofacial structures were also generated. COMPARISON:  Prior CT from 12/27/2015. FINDINGS: CT HEAD FINDINGS Brain: Advanced cerebral atrophy with mild to moderate chronic microvascular ischemic disease. There is subtle hyperdensity overlying the left temporal occipital convexity measuring up to 6 mm, suspicious for small amount of extra-axial hemorrhage (series 201, image 12). No other acute intracranial hemorrhage. No evidence for acute large vessel territory infarct. 2.6 cm meningioma overlies the left frontal convexity (series 201, image 28). Additional partially calcified 1.8 cm meningioma present at the left cerebellopontine angle (series 201, image 9). No associated mass effect or edema. No other  mass lesion. No midline shift. No hydrocephalus. Vascular: No asymmetric hyperdense vessel. Scattered vascular calcifications noted within the carotid siphons. Skull: Left periorbital and facial contusion. Scalp soft tissues otherwise unremarkable. Calvarium intact. Other: Mastoids are clear. CT MAXILLOFACIAL FINDINGS Osseous: Acute comminuted fractures involve the left zygomatic arch without significant displacement. There are acute fractures through the anterior and posterior walls of the left maxillary sinus. Associated fractures of the left orbital floor and lateral wall of the bony left orbit. Constellation consistent with an acute tripod fracture. No significant displacement about the left orbital floor fracture. Right zygomatic arch and maxilla intact. Pterygoid plates intact. Nasal bones intact. Nasal septum bowed to the right but intact. No acute mandibular fracture. Mandibular condyles normally situated. Patient is edentulous. Degenerative changes noted about the temporomandibular joints. Orbits: Globes intact. Patient status post lens extraction bilaterally. Acute fractures of the left orbital floor, lateral wall of the bony left orbit, with extension towards the left orbital roof. No significant displacement at the left orbital floor fracture. Extra-ocular muscles remain normally positioned. No significant retro-orbital hematoma. Bony right orbit intact. Sinuses: Blood largely opacifies the left maxillary sinus. There is herniation of fracture antral fat into the sinus itself (series 315, image 45). Paranasal sinuses are otherwise clear. Soft tissues: Left periorbital and facial contusion. CT CERVICAL SPINE FINDINGS Alignment: Severe kyphotic angulation with exaggeration of the normal cervical lordosis, severely limiting the study. Trace anterolisthesis of C7 on T1. No other obvious listhesis. Skull base and vertebrae: Skullbase intact. Normal C1-2 articulations preserved. Dens intact. Vertebral body  heights maintained. No obvious fracture. Soft tissues and spinal canal: Visualized soft tissues of the neck grossly unremarkable. No definite prevertebral edema. Disc levels: Moderate degenerative spondylolysis noted at C6-7. No other significant degenerative changes. Upper chest: Prominent atheromatous plaque noted within the visualized aorta. Emphysematous changes noted. No apical pneumothorax. Scattered atelectatic changes noted within the visualized lungs. IMPRESSION: CT HEAD: 1. Small volume acute/ early subacute extra-axial hemorrhage overlying the posterior left temporal occipital convexity. No associated mass effect. 2. No other acute intracranial process. 3. Meningiomas positioned at the left frontal convexity and  left cerebellopontine angle without associated edema. 4. Advanced age-related cerebral atrophy. CT MAXILLOFACIAL: 1. Acute left-sided tripod fracture as detailed above. 2. Left periorbital and facial contusion. CT CERVICAL SPINE: 1. No definite acute traumatic injury within the cervical spine. Please note study is markedly limited due to severe kyphotic angulation of the thoracic spine. If there is high clinical suspicion for possible occult cervical spinal injury, a repeat study with improved positioning is recommended. 2. Emphysema. Critical Value/emergent results were called by telephone at the time of interpretation on 05/08/2016 at 4:18 am to Dr. Shirlyn Goltz , who verbally acknowledged these results. Electronically Signed   By: Jeannine Boga M.D.   On: 05/08/2016 04:27    Anti-infectives: Anti-infectives    None     Assessment/Plan Fall, ground level  SDH left temporal lobe- [name], neurologically intact; further imaging per NS Left tripod fractureWilburn Cornelia, MD; non-op; elevate, ice, liquid-soft diet, saline nasal spray  A.fib, Elevated INR- hold coumadin, s/p reversal with Kcentra Hypothyroidism - synthroid   FEN: full liquid diet, continue IVF for now VTE: SCD's ID:  none  Pain: Tylenol PRN, fentanyl patch 75 mcg  Dispo: transfer to the floor, mobilize with PT    LOS: 1 day    Jill Alexanders , [facility] 05/09/2016, 8:49 AM Pager: [phone] Consults: [phone] Mon-Fri 7:00 am-4:30 pm Sat-Sun 7:00 am-11:30 am

## 2016-05-09 NOTE — Progress Notes (Signed)
Patient ID: Leah [name], female   DOB: 1927-12-21, 83 y.o.   MRN: [id number] Repeat CT scan shows no evidence of any enlarged hemorrhage. Mental status changes possibly related to hypoxia.

## 2016-05-09 NOTE — Plan of Care (Signed)
Problem: Pain Managment: Goal: General experience of comfort will improve Outcome: Completed/Met Date Met: 05/09/16 Patient has not complained of pain.  Problem: Nutrition: Goal: Adequate nutrition will be maintained Outcome: Progressing Diet advanced to full liquids today.

## 2016-05-09 NOTE — Significant Event (Signed)
Rapid Response Event Note  Overview:  Called to see patient with resp distress Time Called: 1618 Arrival Time: 1620 Event Type: Respiratory  Initial Focused Assessment:  Alert sitting up in bed - follows few commands - unable to understand speech  - increased WOB - RR 32 - using some abdominal muscles - on NRB mask 100% - O2 sats increasing to 95-97%.  BP 176/88 Afib 111.  Bil BS present - some rhonchi but more intense upper airway coarseness with some stridor when anxious.  Can stick out tongue.  Fair cough - some thick BT creamy secretions.     Interventions:  Albuterol nebulizer given.  IV decreased to [facility].  Lasix 20 mg IV given by [facility] RN per order.  Stat PCXR ordered. Placed on telemetry and continuous O2 sat monitor.   Obie Dredge PA to bedside.  Speech somewhat better but remains confused - new finding per staff and PA.  Intermittent periods of stridor when upset - relaxes and gets better - some productive sputum - remains confused.  Mouth breathing - changed to 30 % VM for comfort - O2 sats remaining stable on mask.  For CT scan of head and transfer to SDU.  Handoff to [place] - to call as needed.    Plan of Care (if not transferred):  Event Summary: Name of Physician Notified: Trauma - Dr. Grandville Silos   PA Simman to bedside at  (pta RRT)    at    Outcome: Transferred (Comment) ([id number])     Quin Hoop

## 2016-05-09 NOTE — Progress Notes (Signed)
Rapid Response called found patient in respiratory distress, BS diminished with wheezes and rhonchi sat 100% on 100% NRB mask.  Albuterol PRN neb given.  Pt tolerated well with productive cough of thick brown secretions.  RR decreased, stridorous sounding at times, pt confused and hard to understand when speaking.  Family in room.  PA came to assess patient.  Switched back to [place] once more stable and less distress. 98% sat on 6 lpm [place].  Transferring patient to stepdown unit.

## 2016-05-09 NOTE — Care Management Note (Signed)
Case Management Note  Patient Details  Name: Leah [name] MRN: [id number] Date of Birth: [birth year]  Subjective/Objective:   Pt admitted on 05/08/16 s/p fall with SDH Lt temporal lobe and Lt tripod fx.  PTA, pt resided at home with son; she ambulates with walker.                 Action/Plan: Noted Rapid Response notes and transfer to SDU today.  Recommend PT/OT evaluations when medically stable.    Expected Discharge Date:                  Expected Discharge Plan:  Skilled Nursing Facility  In-House Referral:  Clinical Social Work  Discharge planning Services     Post Acute Care Choice:    Choice offered to:     DME Arranged:    DME Agency:     HH Arranged:    [place] Agency:     Status of Service:  In process, will continue to follow  If discussed at Long Length of Stay Meetings, dates discussed:    Additional Comments:  Reinaldo Raddle, RN, BSN  Trauma/Neuro ICU Case Manager [phone]

## 2016-05-09 NOTE — Progress Notes (Addendum)
Patient arrived from 4E to 62C13. Pt noted confused and SOB. Per family pt needed oxygen during this hospital stay not O2 nondependent at home. Pt is dyspneic and tachypnic. BP 176/88 with 26 respiration. Pt desat on room air, maintain in the low 90 with 3L. Non rebreather applied and she sat at 100%. Nebulizer administered. Family at bedside.

## 2016-05-09 NOTE — Progress Notes (Signed)
Attempted to give report to [facility]. RN is currently receiving report on another patient. Left name/number for return call.  Joellen Jersey, RN.

## 2016-05-10 LAB — PROTIME-INR
INR: 1.15
Prothrombin Time: 14.7 s (ref 11.4–15.2)

## 2016-05-10 MED ORDER — ALPRAZOLAM 0.5 MG PO TABS
0.5000 mg | ORAL_TABLET | Freq: Every morning | ORAL | Status: DC
  Administered 2016-05-11 – 2016-05-17 (×6): 0.5 mg via ORAL
  Filled 2016-05-10 (×6): qty 1

## 2016-05-10 MED ORDER — ALPRAZOLAM 0.5 MG PO TABS: 0.5000 mg | ORAL_TABLET | ORAL | Status: DC

## 2016-05-10 MED ORDER — ALPRAZOLAM 0.5 MG PO TABS
0.5000 mg | ORAL_TABLET | Freq: Once | ORAL | Status: AC
  Administered 2016-05-10: 0.5 mg via ORAL
  Filled 2016-05-10: qty 1

## 2016-05-10 MED ORDER — ALPRAZOLAM 0.5 MG PO TABS
1.0000 mg | ORAL_TABLET | Freq: Every day | ORAL | Status: DC
  Administered 2016-05-10 – 2016-05-16 (×6): 1 mg via ORAL
  Filled 2016-05-10 (×8): qty 2

## 2016-05-10 NOTE — Progress Notes (Signed)
[facility] Progress Note     Subjective: Confused but answering questions appropriately. Family at bedside. No complaints. Tolerating PO. Urinating.   Objective: Vital signs in last 24 hours: Temp:  [97.9 F (36.6 C)-100 F (37.8 C)] 99 F (37.2 C) (03/28 1233) Pulse Rate:  [72-151] 151 (03/28 1233) Resp:  [12-24] 15 (03/28 1233) BP: (128-176)/(72-107) 166/96 (03/28 1031) SpO2:  [90 %-100 %] 93 % (03/28 1233) FiO2 (%):  [100 %] 100 % (03/27 1658) Weight:  [60 kg (132 lb 4.4 oz)] 60 kg (132 lb 4.4 oz) (03/27 1850) Last BM Date:  ("about 3-4 days ago" per patient)  Intake/Output from previous day: 03/27 0701 - 03/28 0700 In: 1185 [P.O.:360; I.V.:825] Out: -  Intake/Output this shift: Total I/O In: 400 [I.V.:400] Out: -   PE: Gen:  Alert, NAD, pleasant and cooperative HEENT: right periorbital ecchymosis/contusion, pupils equal and round, EOM's in tact  Card:  Irregularly irregular Pulm: Non-labored, clear to auscultation bilaterally Abd: Obvious abdominal wall defect/large hernia, Soft, non-tender, bowel sounds present in all 4 quadrants  Neuro: Alert and oriented to person and place, not time.    Lab Results:   Recent Labs  05/08/16 0223 05/09/16 0042  WBC 6.4 4.8  HGB 11.4* 10.3*  HCT 35.7* 32.3*  PLT 183 152   BMET  Recent Labs  05/08/16 0223 05/09/16 0731  NA 138 142  K 3.6 3.3*  CL 97* 104  CO2 32 32  GLUCOSE 95 82  BUN 19 10  CREATININE 0.86 0.57  CALCIUM 10.1 9.5   PT/INR  Recent Labs  05/09/16 0731 05/10/16 0406  LABPROT 14.7 14.7  INR 1.15 1.15   CMP     Component Value Date/Time   NA 142 05/09/2016 0731   K 3.3 (L) 05/09/2016 0731   CL 104 05/09/2016 0731   CO2 32 05/09/2016 0731   GLUCOSE 82 05/09/2016 0731   BUN 10 05/09/2016 0731   CREATININE 0.57 05/09/2016 0731   CALCIUM 9.5 05/09/2016 0731   CALCIUM 10.0 09/15/2010 0530   PROT 6.1 (L) 12/27/2015 0959   ALBUMIN 3.3 (L) 12/27/2015 0959   AST 16 12/27/2015  0959   ALT 10 (L) 12/27/2015 0959   ALKPHOS 112 12/27/2015 0959   BILITOT 0.9 12/27/2015 0959   GFRNONAA >60 05/09/2016 0731   GFRAA >60 05/09/2016 0731   Lipase     Component Value Date/Time   LIPASE 23 11/21/2015 1343   Studies/Results: Ct Head Wo Contrast  Result Date: 05/09/2016 CLINICAL DATA:  83 year old female with confusion. Recent fall. Subsequent encounter. EXAM: CT HEAD WITHOUT CONTRAST TECHNIQUE: Contiguous axial images were obtained from the base of the skull through the vertex without intravenous contrast. COMPARISON:  05/08/2016. FINDINGS: Brain: Posterior left temporal subdural hematoma with maximal thickness of 5.5 mm similar to prior exam. I suspect there is adjacent small amount of subarachnoid hemorrhage or hemorrhagic contusion involving the left temporal lobe. Left frontal extra-axial 9 mm focal blood collection. Left frontal convexity and left cerebellopontine angle meningiomas as previously described. No acute thrombotic infarct. Global atrophy Vascular: Calcified vasculature. Skull: Hyperostosis frontalis.  No skull fracture. Sinuses/Orbits: Complex fracture left maxillary sinus with herniation of fat into this region in addition to presence of blood/fluid. Left orbital floor fracture. Left zygomatic arch fracture. Left lateral orbital wall fracture with slight inward displacement which could eventually impinge upon the left lateral rectus muscle. IMPRESSION: Posterior left temporal subdural hematoma with maximal thickness of 5.5 mm similar to prior exam. I  suspect there is adjacent small amount of subarachnoid hemorrhage or hemorrhagic contusion involving the left temporal lobe. Left frontal extra-axial 9 mm focal blood collection. Left frontal convexity and left cerebellopontine angle meningiomas as previously described. Complex fracture left maxillary sinus with herniation of fat into this region in addition to presence of blood/fluid. Left orbital floor fracture. Left  zygomatic arch fracture. Left lateral orbital wall fracture with slight inward displacement which could eventually impinge upon the left lateral rectus muscle. These results will be called to the ordering clinician or representative by the Radiologist Assistant, and communication documented in the PACS or zVision Dashboard. Electronically Signed   By: Genia Del M.D.   On: 05/09/2016 18:55   Dg Chest Port 1 View  Result Date: 05/09/2016 CLINICAL DATA:  Acute respiratory distress. EXAM: PORTABLE CHEST 1 VIEW COMPARISON:  Chest radiograph January 28, 2013 FINDINGS: Cardiac silhouette is enlarged, accentuated by a severe kyphosis/habitus. Mild bronchitic changes without pleural effusion or focal consolidation the lung apices obscured. No definite pneumothorax. Chronic changes RIGHT humeral head. Osteopenia. Suspected acute RIGHT rib fractures. IMPRESSION: Habitus limited examination.  Suspected acute RIGHT rib fractures. Cardiomegaly and chronic bronchitic changes. Electronically Signed   By: Elon Alas M.D.   On: 05/09/2016 19:03   Anti-infectives: Anti-infectives    None     Assessment/Plan Fall, ground level  SDH left temporal lobe- [name], neurologically intact; repeat CT 3/27 stable Left tripod fractureWilburn Cornelia, MD; non-op; elevate, ice, liquid-soft diet, saline nasal spray  A.fib, Elevated INR- hold coumadin, s/p reversal with Kcentra Hypothyroidism - synthroid  Anxiety - home medications (xanax, Cymbalta)    FEN: dysphagia 2 diet, continue IVF for now VTE: SCD's ID: none  Pain: Tylenol PRN, fentanyl patch 75 mcg  Dispo: SDU, therapies, home anxiety meds, wean off O2 as tolerates   PT currently recommending SNF at discharge   LOS: 2 days    Jill Alexanders , [facility] 05/10/2016, 3:29 PM Pager: [phone] Consults: [phone] Mon-Fri 7:00 am-4:30 pm Sat-Sun 7:00 am-11:30 am

## 2016-05-10 NOTE — Plan of Care (Signed)
Problem: Safety: Goal: Ability to remain free from injury will improve Outcome: Not Progressing Patient having periodic episodes of confusion and combativeness

## 2016-05-10 NOTE — Evaluation (Signed)
Clinical/Bedside Swallow Evaluation Patient Details  Name: Leah [name] MRN: [id number] Date of Birth: [birth year]  Today's Date: 05/10/2016 Time: SLP Start Time (ACUTE ONLY): 0900 SLP Stop Time (ACUTE ONLY): 0915 SLP Time Calculation (min) (ACUTE ONLY): 15 min  Past Medical History:  Past Medical History:  Diagnosis Date  . Coronary artery disease   . Hypertension   . Hypothyroidism   . Panic attacks   . RLS (restless legs syndrome)   . Thyroid disease   . Vitamin D deficiency    Past Surgical History:  Past Surgical History:  Procedure Laterality Date  . JOINT REPLACEMENT     HPI:  Patient presents to the [facility] emergency department after suffering a facial injury. The patient has a history of atrial fibrillation and is on chronic Coumadin therapy. No apparent loss of consciousness. The patient and her family report a fall in the home, no significant prior history of facial injury or recurrent infection.CT scan shows minimally displaced trimalar fracture, physical examination the patient has moderate swelling and ecchymosis involving the left periorbital region, Pt had an episode of desaturation overnight after PO intake, coughing up thick brown secretions. Pt   had reproted some difficulty eating to MD. CXR shows no acute finding other than Suspected acute RIGHT rib fractures.    Assessment / Plan / Recommendation Clinical Impression  Pt demonstrates adequate swallow function with minimal trials observed. Pt is presently confused and paranoid and anxious, will not take any PO from staff. SLP repositioned pt and observed daughter given sips of water from a straw, about 3 oz continuously, as well as bites of puree. No aigns of aspiration obseved, subjectively swallow felt timely and strong under palpation. Pt is at increased risk of aspiraiton given recent fall, confusion and poor positioning in bed. She does not evidence any pain with swallow. Her son reports a choking episode  many years ago with pt being a very careful eater ever since. She wears dentures but family also cuts up soft foods for her at home. Pt would not be able to participate in objective swallow testing at this point, but function appears adequate for intake as long as aspiration precautions are completed. Will initiate a dys 2 (finely chopped) diet with thin liquids. Family may bring foods from home if desired.  SLP Visit Diagnosis: Dysphagia, oropharyngeal phase (R13.12)    Aspiration Risk  Moderate aspiration risk    Diet Recommendation Dysphagia 2 (Fine chop);Thin liquid   Liquid Administration via: Cup;Straw Medication Administration: Whole meds with liquid Supervision: Full supervision/cueing for compensatory strategies Compensations: Slow rate;Small sips/bites;Minimize environmental distractions Postural Changes: Seated upright at 90 degrees;Remain upright for at least 30 minutes after po intake    Other  Recommendations Oral Care Recommendations: Oral care BID   Follow up Recommendations 24 hour supervision/assistance      Frequency and Duration min 2x/week  1 week       Prognosis Prognosis for Safe Diet Advancement: Good      Swallow Study   General HPI: Patient presents to the [facility] emergency department after suffering a facial injury. The patient has a history of atrial fibrillation and is on chronic Coumadin therapy. No apparent loss of consciousness. The patient and her family report a fall in the home, no significant prior history of facial injury or recurrent infection.CT scan shows minimally displaced trimalar fracture, physical examination the patient has moderate swelling and ecchymosis involving the left periorbital region, Pt had an episode of desaturation overnight  after PO intake, coughing up thick brown secretions. Pt   had reproted some difficulty eating to MD. CXR shows no acute finding other than Suspected acute RIGHT rib fractures.  Type of Study: Bedside  Swallow Evaluation Previous Swallow Assessment: none Diet Prior to this Study: NPO Temperature Spikes Noted: Yes Respiratory Status: Room air History of Recent Intubation: No Behavior/Cognition: Alert;Uncooperative;Confused Oral Cavity Assessment: Dry Oral Care Completed by SLP: No Oral Cavity - Dentition: Dentures, not available Vision: Functional for self-feeding Self-Feeding Abilities: Needs assist Patient Positioning: Upright in bed Baseline Vocal Quality: Hoarse Volitional Cough: Cognitively unable to elicit Volitional Swallow: Able to elicit    Oral/Motor/Sensory Function Overall Oral Motor/Sensory Function: Within functional limits   Ice Chips     Thin Liquid Thin Liquid: Within functional limits Presentation: Straw    Nectar Thick Nectar Thick Liquid: Not tested   Honey Thick Honey Thick Liquid: Not tested   Puree Puree: Within functional limits   Solid   GO   Solid: Not tested       Herbie [place], MA CCC-SLP [phone]  [name] 05/10/2016,9:24 AM

## 2016-05-10 NOTE — Clinical Social Work Note (Signed)
Clinical Social Work Assessment  Patient Details  Name: Leah [name] MRN: [id number] Date of Birth: December 24, 1927  Date of referral:  05/10/16               Reason for consult:  Facility Placement                Permission sought to share information with:  Chartered certified accountant granted to share information::  Yes, Verbal Permission Granted  Name::     Leah [name]  Agency::  SNF  Relationship::  adult children  Contact Information:     Housing/Transportation Living arrangements for the past 2 months:  Single Family Home Source of Information:  Adult Children Patient Interpreter Needed:  None Criminal Activity/Legal Involvement Pertinent to Current Situation/Hospitalization:  No - Comment as needed Significant Relationships:  Adult Children Lives with:  Adult Children Do you feel safe going back to the place where you live?  No Need for family participation in patient care:  Yes (Comment) (help with decision making at this time, family also assisting with ADLs at home)  Care giving concerns:  Pt lives at home with son but son works during the day and pt was responsible for her own care while son at work.  With current level of impairment pt would not be able to care for herself at this time.   Social Worker assessment / plan:  CSW met with pt and pt children concerning PT recommendation for SNF.  Pt is alert but did not speak to CSW when addressed- did not attempt to communicate throughout interview.  CSW discussed SNF and SNF referral with adult children.  Family states pt has been placed in the past and are familiar with rehab process.  Employment status:  Retired Forensic scientist:  Medicare PT Recommendations:  [place] / Referral to community resources:  [place]  Patient/Family's Response to care:  Pt family is agreeable to SNF and understand pt would not be safe to return home at this time.   Hopeful for placement in [place] where she would be more accessible to all family members.  Patient/Family's Understanding of and Emotional Response to Diagnosis, Current Treatment, and Prognosis:  Patient family is very involved in pt care but feel uninformed about pt current diagnosis- concerned about pt brain bleed and what that means for patient cognitively.  Emotional Assessment Appearance:  Appears stated age Attitude/Demeanor/Rapport:  Unable to Assess Affect (typically observed):  Unable to Assess Orientation:  Oriented to Self Alcohol / Substance use:  Not Applicable Psych involvement (Current and /or in the community):  No (Comment)  Discharge Needs  Concerns to be addressed:  Care Coordination Readmission within the last 30 days:  No Current discharge risk:  Physical Impairment Barriers to Discharge:  Continued Medical Work up   Leah Ny, LCSW 05/10/2016, 3:54 PM

## 2016-05-10 NOTE — Progress Notes (Signed)
Paged MD wyatt regarding restarting pts home medications. Family concerned about that.

## 2016-05-10 NOTE — NC FL2 (Signed)
[place] LEVEL OF CARE SCREENING TOOL     IDENTIFICATION  Patient Name: Leah [name] Birthdate: 1927/06/09 Sex: female Admission Date (Current Location): 05/08/2016  [facility] and [place] Number:  Engineering geologist and Address:  [facility] [place], [place] [address], [place], [place] [address]      Provider Number: [id number]  Attending Physician Name and Address:  Trauma Md, MD  Relative Name and Phone Number:       Current Level of Care: Hospital Recommended Level of Care: [place] Prior Approval Number:    Date Approved/Denied:   PASRR Number: [id number]  Discharge Plan: SNF    Current Diagnoses: Patient Active Problem List   Diagnosis Date Noted  . SDH (subdural hematoma) ([place]) 05/08/2016  . Dehydration   . Diarrhea 11/21/2015  . Hypokalemia due to loss of potassium 11/21/2015  . Hypothyroidism 11/21/2015  . Hypertension 11/21/2015  . CAD (coronary artery disease) 11/21/2015  . Panic attacks 11/21/2015  . Restless legs syndrome 11/21/2015    Orientation RESPIRATION BLADDER Height & Weight     Self  O2 (6L ) Incontinent Weight: 132 lb 4.4 oz (60 kg) Height:  5' (152.4 cm)  BEHAVIORAL SYMPTOMS/MOOD NEUROLOGICAL BOWEL NUTRITION STATUS      Continent Diet (see DC summary)  AMBULATORY STATUS COMMUNICATION OF NEEDS Skin   Extensive Assist Verbally Bruising                       Personal Care Assistance Level of Assistance  Bathing, Dressing Bathing Assistance: Maximum assistance   Dressing Assistance: Maximum assistance     Functional Limitations Info             SPECIAL CARE FACTORS FREQUENCY  PT (By licensed PT), OT (By licensed OT)     PT Frequency: 5/wk OT Frequency: 5/wk            Contractures      Additional Factors Info  Code Status, Allergies, Psychotropic Code Status Info: FULL Allergies Info: Cefdinir, Codeine Psychotropic Info: cymbalta         Current Medications  (05/10/2016):  This is the current hospital active medication list Current Facility-Administered Medications  Medication Dose Route Frequency Provider Last Rate Last Dose  . 0.9 %  sodium chloride infusion   Intravenous Continuous Rolm Bookbinder, MD 50 mL/hr at 05/09/16 2034    . acetaminophen (TYLENOL) tablet 650 mg  650 mg Oral Q4H PRN Rolm Bookbinder, MD      . albuterol (PROVENTIL) (2.5 MG/3ML) 0.083% nebulizer solution 2.5 mg  2.5 mg Inhalation Q6H PRN Rolm Bookbinder, MD   2.5 mg at 05/09/16 1628  . diltiazem (CARDIZEM CD) 24 hr capsule 240 mg  240 mg Oral Daily Rolm Bookbinder, MD   240 mg at 05/10/16 1031  . DULoxetine (CYMBALTA) DR capsule 30 mg  30 mg Oral TID Rolm Bookbinder, MD   30 mg at 05/10/16 1032  . fentaNYL (DURAGESIC - dosed mcg/hr) patch 75 mcg  75 mcg Transdermal Q72H Rolm Bookbinder, MD   75 mcg at 05/08/16 0955  . hydrALAZINE (APRESOLINE) injection 5-10 mg  5-10 mg Intravenous Q6H PRN Jill Alexanders, PA-C      . HYDROcodone-acetaminophen (NORCO/VICODIN) 5-325 MG per tablet 1 tablet  1 tablet Oral Q4H PRN Rolm Bookbinder, MD      . levothyroxine (SYNTHROID, LEVOTHROID) tablet 137 mcg  137 mcg Oral QAC breakfast Rolm Bookbinder, MD   137 mcg at  05/10/16 0845  . MEDLINE mouth rinse  15 mL Mouth Rinse BID Judeth Horn, MD   15 mL at 05/10/16 1032  . morphine 2 MG/ML injection 1 mg  1 mg Intravenous Q1H PRN Rolm Bookbinder, MD      . ondansetron [facility]) tablet 4 mg  4 mg Oral Q6H PRN Rolm Bookbinder, MD       Or  . ondansetron [facility]) injection 4 mg  4 mg Intravenous Q6H PRN Rolm Bookbinder, MD      . saccharomyces boulardii [facility]) capsule 250 mg  250 mg Oral BID Rolm Bookbinder, MD   250 mg at 05/10/16 1031  . tamsulosin (FLOMAX) capsule 0.4 mg  0.4 mg Oral Daily Rolm Bookbinder, MD   0.4 mg at 05/10/16 1032     Discharge Medications: Please see discharge summary for a list of discharge medications.  Relevant Imaging  Results:  Relevant Lab Results:   Additional Information SS#: [id number]  Jorge Ny, LCSW

## 2016-05-10 NOTE — Progress Notes (Signed)
Patient suddenly became confused and combative this morning with screaming, striking at staff, banging equipment on the side rails, and pulling at her clothing and IV's.  Her heart rate increased to the 160's during this episode.  Patient sustained skin tears to bilateral wrists during this episode.  She was given Morphine 1 mg IV which helped to settle her down after about 15-20 minutes. The bilateral wrist skin tears were dressed with vaseline gauze and soft kerlix. Dr. Hulen Skains notified and ordered xanax 0.5mg  once. Dr. Ellene Route also notified-new orders at this time.  Patient's son and daughter came to bedside and were notified of the incident.  They are currently providing support and reassurance to the patient. She is now exhibiting paranoid behavior, viewing anything offered to her by the staff with suspicion. Will continue to monitor patient.

## 2016-05-10 NOTE — Evaluation (Signed)
Physical Therapy Evaluation Patient Details Name: Leah [name] MRN: [id number] DOB: 12-09-1927 Today's Date: 05/10/2016   History of Present Illness  Pt is an 83 y.o. female admitted to ED on 05/08/16 after a fall at home. CT shows L temporal SDH; L tripod fx, L periorbital and facial contusion; C-spine clear. Pertinent PMH includes HTN, CAD, a-fib on Coumadin, RLS, and panic attacks.   Clinical Impression  Pt presents to PT with generalized weakness, decreased awareness, and an overall decrease in functional mobility secondary to above. PTA, pt living at home with son; mod indep with RW for household amb, requiring assist for bathing and cooking. Daughter present throughout session and reports decline in pt's cognitive status from baseline. Today, pt able to perform bed mob with maxA and stand with RW and modA, requiring increased time for movement initiation; questionable if her lack of participation may be related to recent medications. Pt would benefit from continued acute PT services to maximize functional mobility and decrease caregiver burden.    Follow Up Recommendations SNF;Supervision for mobility/OOB    Equipment Recommendations  None recommended by PT    Recommendations for Other Services OT consult     Precautions / Restrictions Precautions Precautions: Fall Restrictions Weight Bearing Restrictions: No      Mobility  Bed Mobility Overal bed mobility: Needs Assistance Bed Mobility: Supine to Sit     Supine to sit: HOB elevated;Mod assist;Max assist     General bed mobility comments: Pt able to initiate bed mob, requiring increased time and modA for trunk support and BLE maneuvering into sitting. Required cues to keep RLE on ground and use BUE for support in sitting. MaxA to scoot to EOB.  Transfers Overall transfer level: Needs assistance Equipment used: Rolling walker (2 wheeled) Transfers: Sit to/from Stand Sit to Stand: Mod assist         General transfer  comment: Cues for hand placement on RW  Ambulation/Gait                Stairs            Wheelchair Mobility    Modified Rankin (Stroke Patients Only) Modified Rankin (Stroke Patients Only) Pre-Morbid Rankin Score: Moderately severe disability Modified Rankin: Moderately severe disability     Balance Overall balance assessment: Needs assistance Sitting-balance support: Bilateral upper extremity supported;Feet supported Sitting balance-[name]: Fair Sitting balance - Comments: MinA to min guard for sitting balance, relying on BUE for support. Required cues to lean forward and maintain upright posture. RLE with fixed knee ext secondary to hx of RLE rod.                                     Pertinent Vitals/Pain Pain Assessment: Faces Faces Pain Scale: No hurt    Home Living Family/patient expects to be discharged to:: Private residence Living Arrangements: Children (Son) Available Help at Discharge: Family;Available PRN/intermittently Type of Home: House Home Access: Stairs to enter   Entrance Stairs-Number of Steps: 1 Home Layout: One level Home Equipment: Clinical cytogeneticist - 2 wheels;Bedside commode;Grab bars - tub/shower      Prior Function Level of Independence: Needs assistance   Gait / Transfers Assistance Needed: Mod indep with RW for household amb; limited community amb secondary to bilat foot pain.  ADL's / Homemaking Assistance Needed: Daughter reports pt is indep with pericare, but daughter helps with all bathing. Pt does not cook.  Hand Dominance        Extremity/Trunk Assessment   Upper Extremity Assessment Upper Extremity Assessment: Generalized weakness    Lower Extremity Assessment Lower Extremity Assessment: Generalized weakness;LLE deficits/detail LLE Deficits / Details: Family reports rod in LLE, which remains in knee ext.     Cervical / Trunk Assessment Cervical / Trunk Assessment: Kyphotic   Communication   Communication: Expressive difficulties (Mostly audible speech with intermittent mumbling)  Cognition Arousal/Alertness: Suspect due to medications Behavior During Therapy: WFL for tasks assessed/performed Overall Cognitive Status: History of cognitive impairments - at baseline Area of Impairment: Orientation;Attention;Memory;Following commands;Problem solving;Awareness                 Orientation Level: Disoriented to;Place;Time;Situation Current Attention Level: Sustained Memory: Decreased short-term memory Following Commands: Follows one step commands inconsistently;Follows one step commands with increased time   Awareness: Intellectual Problem Solving: Slow processing;Decreased initiation;Difficulty sequencing;Requires verbal cues;Requires tactile cues General Comments: Daughter reports that at baseline, pt may not be oriented to date and time.      General Comments      Exercises     Assessment/Plan    PT Assessment Patient needs continued PT services  PT Problem List Decreased strength;Decreased mobility;Decreased range of motion;Decreased activity tolerance;Decreased cognition;Decreased balance;Decreased skin integrity;Decreased knowledge of use of DME       PT Treatment Interventions Gait training;DME instruction;Therapeutic exercise;Stair training;Functional mobility training;Therapeutic activities;Patient/family education;Cognitive remediation;Balance training    PT Goals (Current goals can be found in the Care Plan section)  Acute Rehab PT Goals Patient Stated Goal: Return home PT Goal Formulation: With patient Time For Goal Achievement: 05/24/16 Potential to Achieve Goals: Fair    Frequency Min 2X/week   Barriers to discharge Decreased caregiver support;Inaccessible home environment      Co-evaluation               End of Session Equipment Utilized During Treatment: Gait belt;Oxygen Activity Tolerance: Patient limited by  lethargy Patient left: in bed;with family/visitor present;with call bell/phone within reach Nurse Communication: Mobility status PT Visit Diagnosis: Other abnormalities of gait and mobility (R26.89);Muscle weakness (generalized) (M62.81)    Time: 4268-3419 PT Time Calculation (min) (ACUTE ONLY): 29 min   Charges:   PT Evaluation $PT Eval Moderate Complexity: 1 Procedure     PT G Codes:        Enis Gash, SPT Office-[phone]  Mabeline Caras 05/10/2016, 1:49 PM

## 2016-05-11 MED ORDER — SODIUM CHLORIDE 0.9% FLUSH: 3.0000 mL | INTRAVENOUS | Status: DC | PRN

## 2016-05-11 MED ORDER — SODIUM CHLORIDE 0.9 % IV SOLN
250.0000 mL | INTRAVENOUS | Status: DC | PRN
Start: 2016-05-11 — End: 2016-05-17

## 2016-05-11 MED ORDER — SODIUM CHLORIDE 0.9% FLUSH
3.0000 mL | Freq: Two times a day (BID) | INTRAVENOUS | Status: DC
  Administered 2016-05-11 – 2016-05-17 (×10): 3 mL via INTRAVENOUS

## 2016-05-11 NOTE — Progress Notes (Signed)
Subjective: CC Doing better, no SOB  Objective: Vital signs in last 24 hours: Temp:  [97.9 F (36.6 C)-99 F (37.2 C)] 98.5 F (36.9 C) (03/29 0823) Pulse Rate:  [75-151] 91 (03/29 0823) Resp:  [11-18] 17 (03/29 0823) BP: (113-166)/(60-142) 154/95 (03/29 0831) SpO2:  [93 %-100 %] 100 % (03/29 0823) Last BM Date:  ("about 3-4 days ago" per patient)  Intake/Output from previous day: 03/28 0701 - 03/29 0700 In: 1658.3 [P.O.:470; I.V.:1188.3] Out: -  Intake/Output this shift: No intake/output data recorded.  General appearance: cooperative Head: facial contusions evolving Resp: wheezes bilaterally Cardio: irregularly irregular rhythm GI: soft, NT Extremities: claves soft\  Lab Results: CBC   Recent Labs  05/09/16 0042  WBC 4.8  HGB 10.3*  HCT 32.3*  PLT 152   BMET  Recent Labs  05/09/16 0731  NA 142  K 3.3*  CL 104  CO2 32  GLUCOSE 82  BUN 10  CREATININE 0.57  CALCIUM 9.5   PT/INR  Recent Labs  05/09/16 0731 05/10/16 0406  LABPROT 14.7 14.7  INR 1.15 1.15   ABG No results for input(s): PHART, HCO3 in the last 72 hours.  Invalid input(s): PCO2, PO2  Studies/Results: Ct Head Wo Contrast  Result Date: 05/09/2016 CLINICAL DATA:  83 year old female with confusion. Recent fall. Subsequent encounter. EXAM: CT HEAD WITHOUT CONTRAST TECHNIQUE: Contiguous axial images were obtained from the base of the skull through the vertex without intravenous contrast. COMPARISON:  05/08/2016. FINDINGS: Brain: Posterior left temporal subdural hematoma with maximal thickness of 5.5 mm similar to prior exam. I suspect there is adjacent small amount of subarachnoid hemorrhage or hemorrhagic contusion involving the left temporal lobe. Left frontal extra-axial 9 mm focal blood collection. Left frontal convexity and left cerebellopontine angle meningiomas as previously described. No acute thrombotic infarct. Global atrophy Vascular: Calcified vasculature. Skull: Hyperostosis  frontalis.  No skull fracture. Sinuses/Orbits: Complex fracture left maxillary sinus with herniation of fat into this region in addition to presence of blood/fluid. Left orbital floor fracture. Left zygomatic arch fracture. Left lateral orbital wall fracture with slight inward displacement which could eventually impinge upon the left lateral rectus muscle. IMPRESSION: Posterior left temporal subdural hematoma with maximal thickness of 5.5 mm similar to prior exam. I suspect there is adjacent small amount of subarachnoid hemorrhage or hemorrhagic contusion involving the left temporal lobe. Left frontal extra-axial 9 mm focal blood collection. Left frontal convexity and left cerebellopontine angle meningiomas as previously described. Complex fracture left maxillary sinus with herniation of fat into this region in addition to presence of blood/fluid. Left orbital floor fracture. Left zygomatic arch fracture. Left lateral orbital wall fracture with slight inward displacement which could eventually impinge upon the left lateral rectus muscle. These results will be called to the ordering clinician or representative by the Radiologist Assistant, and communication documented in the PACS or zVision Dashboard. Electronically Signed   By: Genia Del M.D.   On: 05/09/2016 18:55   Dg Chest Port 1 View  Result Date: 05/09/2016 CLINICAL DATA:  Acute respiratory distress. EXAM: PORTABLE CHEST 1 VIEW COMPARISON:  Chest radiograph January 28, 2013 FINDINGS: Cardiac silhouette is enlarged, accentuated by a severe kyphosis/habitus. Mild bronchitic changes without pleural effusion or focal consolidation the lung apices obscured. No definite pneumothorax. Chronic changes RIGHT humeral head. Osteopenia. Suspected acute RIGHT rib fractures. IMPRESSION: Habitus limited examination.  Suspected acute RIGHT rib fractures. Cardiomegaly and chronic bronchitic changes. Electronically Signed   By: Elon Alas M.D.   On: 05/09/2016  19:03    Anti-infectives: Anti-infectives    None      Assessment/Plan: Fall, ground level  SDH left temporal lobe- [name], neurologically intact; repeat CT 3/27 stable Left tripod fractureWilburn Cornelia, MD; non-op; elevate, ice, liquid-soft diet, saline nasal spray  A.fib, Elevated INR- hold coumadin and do not resume, s/p reversal with Kcentra. Home meds including cardizem Hypothyroidism - synthroid  Anxiety - home medications (xanax, Cymbalta)   FEN - dysphagia 2 diet, continue IVF for now VTE: SCD's Pain: Tylenol PRN, fentanyl patch 75 mcg (home med) Dispo: to floor, SNF I spoke with her family   LOS: 3 days    Georganna Skeans, MD, MPH, FACS Trauma: [phone] General Surgery: [phone]  3/29/2018Patient ID: Nuala Alpha, female   DOB: 1927-11-28, 83 y.o.   MRN: [id number]

## 2016-05-11 NOTE — Progress Notes (Signed)
CSW provided pt and pt dtr with bed offers- no choice at this time- dtr will discuss with other family members  CSW will continue to follow  Jorge Ny, [place] Social Worker [phone]

## 2016-05-11 NOTE — Progress Notes (Signed)
Pt tx to 5c via bed with family. Pt stable and all belongings sent with family and patient. Report given to [employer]

## 2016-05-11 NOTE — Care Management Note (Signed)
Case Management Note  Patient Details  Name: Leah [name] MRN: [id number] Date of Birth: 16-Feb-1927  Subjective/Objective:   Pt admitted on 05/08/16 s/p fall with SDH Lt temporal lobe and Lt tripod fx.  PTA, pt resided at home with son; she ambulates with walker.                 Action/Plan: Noted Rapid Response notes and transfer to SDU today.  Recommend PT/OT evaluations when medically stable.    Expected Discharge Date:                  Expected Discharge Plan:  Skilled Nursing Facility  In-House Referral:  Clinical Social Work  Discharge planning Services     Post Acute Care Choice:    Choice offered to:     DME Arranged:    DME Agency:     HH Arranged:    [place] Agency:     Status of Service:  In process, will continue to follow  If discussed at Long Length of Stay Meetings, dates discussed:    Additional Comments:  05/11/16 PT recommending SNF at discharge.  CSW following to facilitate dc to SNF upon medical stability.  Will follow progress.  Corinna Gab, RN, BSN   Reinaldo Raddle, RN, BSN  Trauma/Neuro ICU Case Manager [phone]

## 2016-05-11 NOTE — Progress Notes (Signed)
Patient ID: Leah [name], female   DOB: January 08, 1928, 83 y.o.   MRN: [id number] Vital signs are stable Patient seems alert and appropriate this morning Episodes of sundowning in ICU psychosis noted Daughters in attendance I discussed these issues with her She notes that her mother has been a longtime user of Ativan I discussed that though this can help it is also one of the agents that can precipitate these episodes Doing well from neuro standpoint otherwise

## 2016-05-11 NOTE — Progress Notes (Signed)
Patient arrived via 4E staff, vitals stable, tele applied and verified. Family in waiting room while patient settled in room. Patient oriented x1.  Patient bathed at this time.  No complaints of pain.  Continue to monitor.

## 2016-05-11 NOTE — Progress Notes (Signed)
  Speech Language Pathology Treatment: Dysphagia  Patient Details Name: Leah [name] MRN: [id number] DOB: 1927-05-30 Today's Date: 05/11/2016 Time: 0950-1000 SLP Time Calculation (min) (ACUTE ONLY): 10 min  Assessment / Plan / Recommendation Clinical Impression  Pt more appropriate this am, responsive to SLP and willing to follow instructions. Pt has dentures in place, but continues to favor pudding and yogurts rather than soft chopped foods. Daughter reports pt chews for a long time due to long time fear of choking. SLP repositioned pt and discussed importance of upright position for safe swallowing. Trials of thin liquids tolerated well with no signs of aspiration, only slightly delayed swallow subjectively. Recommend pt continue current diet through admission, instructed family to order appropriate foods from menu or bring foods from home as needed. Will sign off.   HPI HPI: Patient presents to the [facility] emergency department after suffering a facial injury. The patient has a history of atrial fibrillation and is on chronic Coumadin therapy. No apparent loss of consciousness. The patient and her family report a fall in the home, no significant prior history of facial injury or recurrent infection.CT scan shows minimally displaced trimalar fracture, physical examination the patient has moderate swelling and ecchymosis involving the left periorbital region, Pt had an episode of desaturation overnight after PO intake, coughing up thick brown secretions. Pt   had reproted some difficulty eating to MD. CXR shows no acute finding other than Suspected acute RIGHT rib fractures.       SLP Plan  Continue with current plan of care       Recommendations  Diet recommendations: Dysphagia 2 (fine chop);Thin liquid Liquids provided via: Straw;Teaspoon Medication Administration: Whole meds with liquid Supervision: Trained caregiver to feed patient;Full supervision/cueing for compensatory  strategies Compensations: Slow rate;Small sips/bites;Minimize environmental distractions Postural Changes and/or Swallow Maneuvers: Seated upright 90 degrees                Oral Care Recommendations: Oral care BID Follow up Recommendations: 24 hour supervision/assistance SLP Visit Diagnosis: Dysphagia, oropharyngeal phase (R13.12) Plan: Continue with current plan of care       [place] Franciso Dierks, MA CCC-SLP [phone]  [name] 05/11/2016, 10:14 AM

## 2016-05-12 MED ORDER — MORPHINE SULFATE (PF) 2 MG/ML IV SOLN
1.0000 mg | Freq: Four times a day (QID) | INTRAVENOUS | Status: DC | PRN
  Administered 2016-05-15 – 2016-05-17 (×3): 1 mg via INTRAVENOUS
  Filled 2016-05-12 (×3): qty 1

## 2016-05-12 MED ORDER — POLYETHYLENE GLYCOL 3350 17 G PO PACK
17.0000 g | PACK | Freq: Every day | ORAL | Status: DC
  Administered 2016-05-12 – 2016-05-17 (×5): 17 g via ORAL
  Filled 2016-05-12 (×5): qty 1

## 2016-05-12 NOTE — Progress Notes (Signed)
qPhysical Therapy Treatment Patient Details Name: Leah [name] MRN: [id number] DOB: Jul 01, 1927 Today's Date: 05/12/2016    History of Present Illness Pt is an 83 y.o. female admitted to ED on 05/08/16 after a fall at home. CT shows L temporal SDH; L tripod fx, L periorbital and facial contusion; C-spine clear. Pertinent PMH includes HTN, CAD, a-fib on Coumadin, RLS, and panic attacks.     PT Comments    Patient not progressing due to lethargy this session.  Was alert at times, but limited participation in sitting balance and transfers.  Feel she is appropriate for SNF level rehab at d/c.  Follow Up Recommendations  SNF;Supervision/Assistance - 24 hour     Equipment Recommendations  None recommended by PT    Recommendations for Other Services       Precautions / Restrictions Precautions Precautions: Fall    Mobility  Bed Mobility Overal bed mobility: Needs Assistance Bed Mobility: Supine to Sit     Supine to sit: Max assist;+2 for physical assistance     General bed mobility comments: used bed pads under pt to assist in bringing hips to EOB and lifting trunk  Transfers   Equipment used: 2 person hand held assist Transfers: Squat Pivot Transfers     Squat pivot transfers: Max assist;+2 physical assistance     General transfer comment: assist of two for pivot to chair; (limited by lethargy)  Ambulation/Gait                 Stairs            Wheelchair Mobility    Modified Rankin (Stroke Patients Only)       Balance Overall balance assessment: Needs assistance Sitting-balance support: Bilateral upper extremity supported;Feet supported Sitting balance-[name]: Poor Sitting balance - Comments: cues to initiate anterior weight shift in sitting, but leans back over time requiring mod A for balance due to lethargy Postural control: Posterior lean;Right lateral lean   Standing balance-[name]: Zero                              Cognition Arousal/Alertness: Lethargic Behavior During Therapy: WFL for tasks assessed/performed Overall Cognitive Status: Impaired/Different from baseline Area of Impairment: Orientation;Following commands;Memory;Attention                 Orientation Level: Disoriented to;Place;Time;Situation Current Attention Level: Sustained Memory: Decreased short-term memory Following Commands: Follows one step commands inconsistently;Follows one step commands with increased time Safety/Judgement: Decreased awareness of safety   Problem Solving: Slow processing;Requires verbal cues;Requires tactile cues        Exercises General Exercises - Upper Extremity Shoulder Flexion: AAROM;Both;Supine;5 reps General Exercises - Lower Extremity Ankle Circles/Pumps: AAROM;Both Heel Slides: AAROM;Right;5 reps;Supine    General Comments General comments (skin integrity, edema, etc.): grandson, Fara Olden, in room and assisting pt with drinking; noted bruising and swelling L hand as well.      Pertinent Vitals/Pain Faces Pain Scale: Hurts little more Pain Location: head Pain Descriptors / Indicators: Tender Pain Intervention(s): Monitored during session    Home Living                      Prior Function            PT Goals (current goals can now be found in the care plan section) Progress towards PT goals: Not progressing toward goals - comment (due to lethargy)    Frequency  Min 2X/week      PT Plan Current plan remains appropriate    Co-evaluation             End of Session Equipment Utilized During Treatment: Oxygen Activity Tolerance: Patient limited by lethargy Patient left: in chair;with call bell/phone within reach;with family/visitor present;with chair alarm set   PT Visit Diagnosis: Other abnormalities of gait and mobility (R26.89);Muscle weakness (generalized) (M62.81)     Time: 1840-3754 PT Time Calculation (min) (ACUTE ONLY): 28 min  Charges:   $Therapeutic Exercise: 8-22 mins $Therapeutic Activity: 8-22 mins                    G CodesMagda Kiel, [place] [phone] 05/12/2016    Reginia Naas 05/12/2016, 12:19 PM

## 2016-05-12 NOTE — Care Management Important Message (Signed)
Important Message  Patient Details  Name: LATAUSHA FLAMM MRN: [id number] Date of Birth: March 24, 1927   Medicare Important Message Given:  Yes    Lyndsi Altic 05/12/2016, 1:48 PM

## 2016-05-12 NOTE — Progress Notes (Signed)
Patient ID: Leah [name], female   DOB: Jan 31, 1928, 83 y.o.   MRN: [id number]  [facility] Surgery Progress Note     Subjective: Patient resting comfortably, no complaints. Son at bedside. States that she had a very good day yesterday. She had a good appetite and slept well. Did not work with PT yesterday. Not requiring any pain medication. Looking into SNF options. No BM in a few days.  Objective: Vital signs in last 24 hours: Temp:  [97.9 F (36.6 C)-98.7 F (37.1 C)] 98 F (36.7 C) (03/30 0606) Pulse Rate:  [53-91] 90 (03/30 0606) Resp:  [16-20] 18 (03/30 0606) BP: (147-180)/(86-110) 180/88 (03/30 0606) SpO2:  [96 %-100 %] 99 % (03/30 0606) Last BM Date: 05/08/16  Intake/Output from previous day: No intake/output data recorded. Intake/Output this shift: No intake/output data recorded.  PE: Gen:  Sleeping, NAD Card:  Irregularly irregular Pulm:  CTAB, no W/R/R, effort normal. On 3L O2 Abd: Soft, NT/ND, +BS, no HSM Ext:  Calves soft, nontender, no edema  Lab Results:  No results for input(s): WBC, HGB, HCT, PLT in the last 72 hours. BMET No results for input(s): NA, K, CL, CO2, GLUCOSE, BUN, CREATININE, CALCIUM in the last 72 hours. PT/INR  Recent Labs  05/10/16 0406  LABPROT 14.7  INR 1.15   CMP     Component Value Date/Time   NA 142 05/09/2016 0731   K 3.3 (L) 05/09/2016 0731   CL 104 05/09/2016 0731   CO2 32 05/09/2016 0731   GLUCOSE 82 05/09/2016 0731   BUN 10 05/09/2016 0731   CREATININE 0.57 05/09/2016 0731   CALCIUM 9.5 05/09/2016 0731   CALCIUM 10.0 09/15/2010 0530   PROT 6.1 (L) 12/27/2015 0959   ALBUMIN 3.3 (L) 12/27/2015 0959   AST 16 12/27/2015 0959   ALT 10 (L) 12/27/2015 0959   ALKPHOS 112 12/27/2015 0959   BILITOT 0.9 12/27/2015 0959   GFRNONAA >60 05/09/2016 0731   GFRAA >60 05/09/2016 0731   Lipase     Component Value Date/Time   LIPASE 23 11/21/2015 1343       Studies/Results: No results  found.  Anti-infectives: Anti-infectives    None       Assessment/Plan Fall, ground level  SDH left temporal lobe- [name], neurologically intact; repeat CT 3/27 stable Left tripod fractureWilburn Cornelia, MD; non-op; elevate, ice, liquid-soft diet, saline nasal spray  A.fib, Elevated INR- hold coumadin and do not resume, s/p reversal with Kcentra. Home meds including cardizem Hypothyroidism - synthroid  Anxiety - home medications (xanax, Cymbalta)  Acute respiratory distress - improving, O2 stable on 3L [place]. Not on O2 prior to arrival   FEN - dysphagia 2 diet, add daily miralax VTE: SCD's Pain: Tylenol PRN, fentanyl patch 75 mcg (home med)  Dispo: SNF   LOS: 4 days    Jerrye Beavers , [facility] 05/12/2016, 7:45 AM Pager: [phone] Consults: [phone] Mon-Fri 7:00 am-4:30 pm Sat-Sun 7:00 am-11:30 am

## 2016-05-12 NOTE — Progress Notes (Signed)
Patient ID: Leah [name], female   DOB: Mar 09, 1927, 83 y.o.   MRN: [id number] Doing well, no acute neurosurgical issues.  I will sign off at this time

## 2016-05-13 ENCOUNTER — Inpatient Hospital Stay (HOSPITAL_COMMUNITY): Payer: Medicare Other

## 2016-05-13 LAB — CBC
HEMATOCRIT: 35.7 % — AB (ref 36.0–46.0)
HEMOGLOBIN: 11.2 g/dL — AB (ref 12.0–15.0)
MCH: 28.1 pg (ref 26.0–34.0)
MCHC: 31.4 g/dL (ref 30.0–36.0)
MCV: 89.7 fL (ref 78.0–100.0)
PLATELETS: 206 10*3/uL (ref 150–400)
RBC: 3.98 MIL/uL (ref 3.87–5.11)
RDW: 14.2 % (ref 11.5–15.5)
WBC: 6.8 10*3/uL (ref 4.0–10.5)

## 2016-05-13 LAB — BASIC METABOLIC PANEL
Anion gap: 6 (ref 5–15)
BUN: 8 mg/dL (ref 6–20)
CO2: 36 mmol/L — ABNORMAL HIGH (ref 22–32)
CREATININE: 0.46 mg/dL (ref 0.44–1.00)
Calcium: 10.5 mg/dL — ABNORMAL HIGH (ref 8.9–10.3)
Chloride: 95 mmol/L — ABNORMAL LOW (ref 101–111)
Glucose, Bld: 107 mg/dL — ABNORMAL HIGH (ref 65–99)
Potassium: 3.1 mmol/L — ABNORMAL LOW (ref 3.5–5.1)
SODIUM: 137 mmol/L (ref 135–145)

## 2016-05-13 MED ORDER — DOCUSATE SODIUM 100 MG PO CAPS
100.0000 mg | ORAL_CAPSULE | Freq: Two times a day (BID) | ORAL | Status: DC
  Administered 2016-05-13 – 2016-05-16 (×7): 100 mg via ORAL
  Filled 2016-05-13 (×9): qty 1

## 2016-05-13 MED ORDER — BISACODYL 10 MG RE SUPP
10.0000 mg | Freq: Once | RECTAL | Status: AC
  Administered 2016-05-13: 10 mg via RECTAL
  Filled 2016-05-13: qty 1

## 2016-05-13 MED ORDER — POTASSIUM CHLORIDE CRYS ER 20 MEQ PO TBCR
20.0000 meq | EXTENDED_RELEASE_TABLET | Freq: Two times a day (BID) | ORAL | Status: AC
  Administered 2016-05-13 (×2): 20 meq via ORAL
  Filled 2016-05-13 (×2): qty 1

## 2016-05-13 NOTE — Progress Notes (Signed)
Patient ID: Leah [name], female   DOB: 29-Nov-1927, 83 y.o.   MRN: [id number]  [facility] Progress Note     Subjective: Patient sleeping very well, unable to awake fully for discussion. Per RN patient grimaced yesterday with left arm/leg movement. I do not see xrays of these extremities in her chart. Tolerating diet. Has not yet had BM.  Objective: Vital signs in last 24 hours: Temp:  [98.3 F (36.8 C)-99.9 F (37.7 C)] 99.3 F (37.4 C) (03/31 0553) Pulse Rate:  [81-100] 99 (03/31 0553) Resp:  [18-20] 20 (03/31 0553) BP: (137-172)/(80-99) 157/82 (03/31 0553) SpO2:  [97 %-100 %] 98 % (03/31 0553) Last BM Date: 05/08/16  Intake/Output from previous day: 03/30 0701 - 03/31 0700 In: 3 [I.V.:3] Out: -  Intake/Output this shift: No intake/output data recorded.  PE: Gen:  Sleeping, NAD Card:  Irregularly irregular Pulm:  CTAB, no W/R/R, effort normal. On 3L O2 Abd: Soft, NT/ND, +BS, no HSM Ext:  Calves soft, nontender, no edema. 2+ DP pulses.  Lab Results:  No results for input(s): WBC, HGB, HCT, PLT in the last 72 hours. BMET No results for input(s): NA, K, CL, CO2, GLUCOSE, BUN, CREATININE, CALCIUM in the last 72 hours. PT/INR No results for input(s): LABPROT, INR in the last 72 hours. CMP     Component Value Date/Time   NA 142 05/09/2016 0731   K 3.3 (L) 05/09/2016 0731   CL 104 05/09/2016 0731   CO2 32 05/09/2016 0731   GLUCOSE 82 05/09/2016 0731   BUN 10 05/09/2016 0731   CREATININE 0.57 05/09/2016 0731   CALCIUM 9.5 05/09/2016 0731   CALCIUM 10.0 09/15/2010 0530   PROT 6.1 (L) 12/27/2015 0959   ALBUMIN 3.3 (L) 12/27/2015 0959   AST 16 12/27/2015 0959   ALT 10 (L) 12/27/2015 0959   ALKPHOS 112 12/27/2015 0959   BILITOT 0.9 12/27/2015 0959   GFRNONAA >60 05/09/2016 0731   GFRAA >60 05/09/2016 0731   Lipase     Component Value Date/Time   LIPASE 23 11/21/2015 1343       Studies/Results: No results  found.  Anti-infectives: Anti-infectives    None       Assessment/Plan Fall, ground level  SDH left temporal lobe- [name], neurologically intact; repeat CT 3/27 stable Left tripod fractureWilburn Cornelia, MD; non-op; elevate, ice, liquid-soft diet, saline nasal spray  A.fib, Elevated INR- hold coumadin and do not resume, s/p reversal with Kcentra. Home meds including cardizem Hypothyroidism - synthroid  Anxiety - home medications (xanax, Cymbalta)  Acute respiratory distress - improving, O2 stable on 3L [place]; attempt to wean. Not on O2 prior to arrival Right rib fractures - IS/pulmonary toilet  FEN-dysphagia 2 diet, add daily miralax VTE:SCD's Pain: Tylenol PRN, fentanyl patch 75 mcg (home med)  Dispo: Labs this AM. eval left arm/leg pain once patient is more awake/alert (please page me). SNF.   Addendum: Rechecked patient once more awake and alert. Nephew at bedside. No ecchymosis or edema noted to left shoulder or forearm, but she is TTP around proximal humerus, elbow, and distal 1/3 left forearm. Will obtain xrays of left humerus and forearm. She is also complaining of SOB on 3L O2. O2 sats stable and WBC WNL, afebrile. Will repeat chest XR. Replace PO potassium. Checks labs in [place]    LOS: 5 days    Jerrye Beavers , [facility] Surgery 05/13/2016, 8:03 AM Pager: [phone] Consults: [phone] Mon-Fri 7:00 am-4:30 pm Sat-Sun  7:00 am-11:30 am

## 2016-05-13 NOTE — Consult Note (Signed)
Reason for Consult:left arm pain after fall Referring Physician: trauma service  Leah [name] is an 83 y.o. female.  HPI: 83 yo female s/p ground level fall with left arm pain. Admitted to the trauma service.  Nursing noted that any movement of the left arm was associated with pain and grimacing. I have been asked to see her for a left elbow radial head fracture. Family at bedside reports that she has chronic severe shoulder OA bilaterally that requires periodic shoulder cortisone injections.  Past Medical History:  Diagnosis Date  . Coronary artery disease   . Hypertension   . Hypothyroidism   . Panic attacks   . RLS (restless legs syndrome)   . Thyroid disease   . Vitamin D deficiency     Past Surgical History:  Procedure Laterality Date  . JOINT REPLACEMENT      History reviewed. No pertinent family history.  Social History:  reports that she quit smoking about 50 years ago. She has never used smokeless tobacco. She reports that she does not drink alcohol or use drugs.  Allergies:  Allergies  Allergen Reactions  . Cefdinir Diarrhea  . Codeine Other (See Comments)    nervous    Medications: I have reviewed the patient's current medications.  Results for orders placed or performed during the hospital encounter of 05/08/16 (from the past 48 hour(s))  CBC     Status: Abnormal   Collection Time: 05/13/16  8:18 AM  Result Value Ref Range   WBC 6.8 4.0 - 10.5 K/uL   RBC 3.98 3.87 - 5.11 MIL/uL   Hemoglobin 11.2 (L) 12.0 - 15.0 g/dL   HCT 15.1 (L) 90.4 - 67.9 %   MCV 89.7 78.0 - 100.0 fL   MCH 28.1 26.0 - 34.0 pg   MCHC 31.4 30.0 - 36.0 g/dL   RDW 38.5 37.0 - 83.3 %   Platelets 206 150 - 400 K/uL  Basic metabolic panel     Status: Abnormal   Collection Time: 05/13/16  8:18 AM  Result Value Ref Range   Sodium 137 135 - 145 mmol/L   Potassium 3.1 (L) 3.5 - 5.1 mmol/L   Chloride 95 (L) 101 - 111 mmol/L   CO2 36 (H) 22 - 32 mmol/L   Glucose, Bld 107 (H) 65 - 99 mg/dL    BUN 8 6 - 20 mg/dL   Creatinine, Ser 2.73 0.44 - 1.00 mg/dL   Calcium 80.9 (H) 8.9 - 10.3 mg/dL   GFR calc non Af Amer >60 >60 mL/min   GFR calc Af Amer >60 >60 mL/min    Comment: (NOTE) The eGFR has been calculated using the CKD EPI equation. This calculation has not been validated in all clinical situations. eGFR's persistently <60 mL/min signify possible Chronic Kidney Disease.    Anion gap 6 5 - 15    Dg Chest 1 View  Result Date: 05/13/2016 CLINICAL DATA:  Left shoulder and forearm pain, patient has bruising and tenderness when palpating left arm. Little range of motion. Best obtainable due to patients pain and condition. EXAM: CHEST  1 VIEW COMPARISON:  05/09/2016 FINDINGS: Improved aeration.  Mild perihilar vascular congestion. Stable cardiomegaly.  Tortuous atheromatous aorta. No effusion.  No pneumothorax. Degenerative changes in bilateral shoulders. IMPRESSION: Improved aeration.  Stable cardiomegaly.  No acute findings. Electronically Signed   By: Corlis Leak M.D.   On: 05/13/2016 11:15   Dg Forearm Left  Result Date: 05/13/2016 CLINICAL DATA:  Left shoulder and forearm pain,  patient has bruising and tenderness when palpating left arm. Little range of motion. Best obtainable due to patients pain and condition. EXAM: LEFT FOREARM - 2 VIEW COMPARISON:  None. FINDINGS: Diffuse osteopenia. Chondrocalcinosis in the TFCC. Large Elbow effusion. Subtle cortical discontinuity in the radial head suggesting mildly impacted fracture. No definite subchondral step-off deformity. IMPRESSION: 1. Large elbow effusion with suspected radial head fracture, mildly impacted. Electronically Signed   By: Lucrezia Europe M.D.   On: 05/13/2016 11:09   Dg Humerus Left  Result Date: 05/13/2016 CLINICAL DATA:  Left shoulder and forearm pain, patient has bruising and tenderness when palpating left arm. Little range of motion. Best obtainable due to patients pain and condition. EXAM: LEFT HUMERUS - 2+ VIEW  COMPARISON:  04/14/2015 FINDINGS: Diffuse osteopenia. High-riding humeral head with progressive Subchondral sclerosis and remodeling. No fracture or dislocation. IMPRESSION: 1. Osteopenia without fracture or dislocation. 2. Progressive   Degenerative changes in the shoulder . Electronically Signed   By: Lucrezia Europe M.D.   On: 05/13/2016 11:12    ROS Blood pressure (!) 165/87, pulse (!) 106, temperature 97.9 F (36.6 C), temperature source Axillary, resp. rate 18, height 5' (1.524 m), weight 60 kg (132 lb 4.4 oz), SpO2 98 %. Physical Exam Patient sitting up in bed and awake eating with a family member feeding her. Left shoulder with generalized swelling and tenderness, skin intact, left elbow is somewhat swollen with some bruising noted. Pain with passive ROM of the left forearm. Wrist with a skin tear - badaged Intact protective sensation Not really able to actively move them for me  Assessment/Plan: Minimally displaced left radial head and neck fracture - no op treatment in a sling (ordered) for 6-8 weeks The elbow will heal on its own.  Ortho follow up as needed after discharge for any problems  Severe left shoulder OA - chronic  Supportive care and pain management  Leah [name],STEVEN R 05/13/2016, 12:09 PM

## 2016-05-14 ENCOUNTER — Inpatient Hospital Stay (HOSPITAL_COMMUNITY): Payer: Medicare Other

## 2016-05-14 DIAGNOSIS — M79609 Pain in unspecified limb: Secondary | ICD-10-CM

## 2016-05-14 DIAGNOSIS — M7989 Other specified soft tissue disorders: Secondary | ICD-10-CM

## 2016-05-14 LAB — BASIC METABOLIC PANEL
ANION GAP: 6 (ref 5–15)
BUN: 9 mg/dL (ref 6–20)
CALCIUM: 11.1 mg/dL — AB (ref 8.9–10.3)
CO2: 37 mmol/L — ABNORMAL HIGH (ref 22–32)
Chloride: 97 mmol/L — ABNORMAL LOW (ref 101–111)
Creatinine, Ser: 0.54 mg/dL (ref 0.44–1.00)
Glucose, Bld: 106 mg/dL — ABNORMAL HIGH (ref 65–99)
POTASSIUM: 3.5 mmol/L (ref 3.5–5.1)
Sodium: 140 mmol/L (ref 135–145)

## 2016-05-14 LAB — MAGNESIUM: MAGNESIUM: 1.5 mg/dL — AB (ref 1.7–2.4)

## 2016-05-14 MED ORDER — ENSURE ENLIVE PO LIQD
237.0000 mL | Freq: Three times a day (TID) | ORAL | Status: DC
  Administered 2016-05-14 (×3): 237 mL via ORAL
  Filled 2016-05-14 (×14): qty 237

## 2016-05-14 MED ORDER — BISACODYL 10 MG RE SUPP
10.0000 mg | Freq: Once | RECTAL | Status: AC
  Administered 2016-05-14: 10 mg via RECTAL
  Filled 2016-05-14: qty 1

## 2016-05-14 MED ORDER — MAGNESIUM SULFATE 2 GM/50ML IV SOLN
2.0000 g | Freq: Once | INTRAVENOUS | Status: AC
  Administered 2016-05-14: 2 g via INTRAVENOUS
  Filled 2016-05-14: qty 50

## 2016-05-14 NOTE — Progress Notes (Signed)
VASCULAR LAB PRELIMINARY  PRELIMINARY  PRELIMINARY  PRELIMINARY  Bilateral lower extremity venous duplex completed.    Preliminary report:  There is no obvious evidence of DVT or SVT noted in the bilateral lower extremities.   Keo Schirmer, RVT 05/14/2016, 12:38 PM

## 2016-05-14 NOTE — Progress Notes (Signed)
Patient ID: Leah [name], female   DOB: 06-Jun-1927, 83 y.o.   MRN: [id number]  [facility] Surgery Progress Note     Subjective: Patient more awake and alert today. Complaining of LUE pain. She also reports bilateral calf pain. Per RN patient is more distended today; she received a suppository yesterday but has yet to have a BM. Patient denies n/v. Tolerating small amounts of diet.  Objective: Vital signs in last 24 hours: Temp:  [97.3 F (36.3 C)-98.1 F (36.7 C)] 97.4 F (36.3 C) (04/01 0514) Pulse Rate:  [75-106] 94 (04/01 0514) Resp:  [12-18] 14 (04/01 0514) BP: (136-165)/(70-99) 155/99 (04/01 0514) SpO2:  [91 %-98 %] 94 % (04/01 0514) Last BM Date: 05/08/16  Intake/Output from previous day: No intake/output data recorded. Intake/Output this shift: No intake/output data recorded.  PE: Gen: Alert, NAD, pleasant Card: Irregularly irregular Pulm: CTAB, no W/R/R, effort normal. On RA Abd: Soft, distended, nontender, +BS, no HSM LUE: in sling, right wrist bandaged BLE: calves are soft but bilaterally tender, edema noted in LLE. 2+DP bilaterally. Motor function intact BLE  Lab Results:   Recent Labs  05/13/16 0818  WBC 6.8  HGB 11.2*  HCT 35.7*  PLT 206   BMET  Recent Labs  05/13/16 0818 05/14/16 0303  NA 137 140  K 3.1* 3.5  CL 95* 97*  CO2 36* 37*  GLUCOSE 107* 106*  BUN 8 9  CREATININE 0.46 0.54  CALCIUM 10.5* 11.1*   PT/INR No results for input(s): LABPROT, INR in the last 72 hours. CMP     Component Value Date/Time   NA 140 05/14/2016 0303   K 3.5 05/14/2016 0303   CL 97 (L) 05/14/2016 0303   CO2 37 (H) 05/14/2016 0303   GLUCOSE 106 (H) 05/14/2016 0303   BUN 9 05/14/2016 0303   CREATININE 0.54 05/14/2016 0303   CALCIUM 11.1 (H) 05/14/2016 0303   CALCIUM 10.0 09/15/2010 0530   PROT 6.1 (L) 12/27/2015 0959   ALBUMIN 3.3 (L) 12/27/2015 0959   AST 16 12/27/2015 0959   ALT 10 (L) 12/27/2015 0959   ALKPHOS 112 12/27/2015 0959   BILITOT  0.9 12/27/2015 0959   GFRNONAA >60 05/14/2016 0303   GFRAA >60 05/14/2016 0303   Lipase     Component Value Date/Time   LIPASE 23 11/21/2015 1343       Studies/Results: Dg Chest 1 View  Result Date: 05/13/2016 CLINICAL DATA:  Left shoulder and forearm pain, patient has bruising and tenderness when palpating left arm. Little range of motion. Best obtainable due to patients pain and condition. EXAM: CHEST  1 VIEW COMPARISON:  05/09/2016 FINDINGS: Improved aeration.  Mild perihilar vascular congestion. Stable cardiomegaly.  Tortuous atheromatous aorta. No effusion.  No pneumothorax. Degenerative changes in bilateral shoulders. IMPRESSION: Improved aeration.  Stable cardiomegaly.  No acute findings. Electronically Signed   By: Lucrezia Europe M.D.   On: 05/13/2016 11:15   Dg Forearm Left  Result Date: 05/13/2016 CLINICAL DATA:  Left shoulder and forearm pain, patient has bruising and tenderness when palpating left arm. Little range of motion. Best obtainable due to patients pain and condition. EXAM: LEFT FOREARM - 2 VIEW COMPARISON:  None. FINDINGS: Diffuse osteopenia. Chondrocalcinosis in the TFCC. Large Elbow effusion. Subtle cortical discontinuity in the radial head suggesting mildly impacted fracture. No definite subchondral step-off deformity. IMPRESSION: 1. Large elbow effusion with suspected radial head fracture, mildly impacted. Electronically Signed   By: Lucrezia Europe M.D.   On: 05/13/2016 11:09  Dg Humerus Left  Result Date: 05/13/2016 CLINICAL DATA:  Left shoulder and forearm pain, patient has bruising and tenderness when palpating left arm. Little range of motion. Best obtainable due to patients pain and condition. EXAM: LEFT HUMERUS - 2+ VIEW COMPARISON:  04/14/2015 FINDINGS: Diffuse osteopenia. High-riding humeral head with progressive Subchondral sclerosis and remodeling. No fracture or dislocation. IMPRESSION: 1. Osteopenia without fracture or dislocation. 2. Progressive   Degenerative  changes in the shoulder . Electronically Signed   By: Lucrezia Europe M.D.   On: 05/13/2016 11:12    Anti-infectives: Anti-infectives    None       Assessment/Plan Fall, ground level  SDH left temporal lobe- [name], neurologically intact; repeat CT 3/27 stable Left tripod fractureWilburn Cornelia, MD; non-op; elevate, ice, liquid-soft diet, saline nasal spray  A.fib, Elevated INR- hold coumadin and do not resume, s/p reversal with Kcentra. Home meds including cardizem Hypothyroidism - synthroid  Anxiety - home medications (xanax, Cymbalta)  Acute respiratory distress - improving, O2 stable on RA Suspected right rib fractures - IS/pulmonary toilet Left elbow radial head fracture - appreciate ortho consult, per Dr. Veverly Fells, nonop, sling x6-8 weeks, f/u ortho PRN  FEN-dysphagia 2 diet, suppository today for ileus VTE:SCD's Pain: Tylenol PRN, fentanyl patch 75 mcg (home med)  Dispo: Replace magnesium. Add ensure for nutritional supplementation. XR's and venous duplex to eval LLE swelling. SNF.   LOS: 6 days    Jerrye Beavers , [facility] Surgery 05/14/2016, 8:21 AM Pager: [phone] Consults: [phone] Mon-Fri 7:00 am-4:30 pm Sat-Sun 7:00 am-11:30 am

## 2016-05-15 MED ORDER — MAGNESIUM CITRATE PO SOLN
0.5000 | Freq: Once | ORAL | Status: AC
  Administered 2016-05-15: 0.5 via ORAL
  Filled 2016-05-15: qty 296

## 2016-05-15 MED ORDER — FENTANYL 25 MCG/HR TD PT72
50.0000 ug | MEDICATED_PATCH | TRANSDERMAL | Status: DC
  Administered 2016-05-15: 50 ug via TRANSDERMAL
  Filled 2016-05-15: qty 2

## 2016-05-15 NOTE — Progress Notes (Signed)
Patient's blood pressure is 173/110. Hydralazine 5mg  given per PRN orders. Reassessment BP is 169/89. Provider notified. Order given to administer an additional 5mg  of Hydralazine per PRN order. Will continue to monitor patient.

## 2016-05-15 NOTE — Progress Notes (Signed)
   Subjective:    Left elbow with mild pain No surgery indicated for left elbow Sling for comfort and pain management  Patient reports pain as mild.  Objective:   VITALS:   Vitals:   05/15/16 0638 05/15/16 0900  BP: (!) 147/88 (!) 167/82  Pulse: 88 60  Resp:  20  Temp: 97.8 F (36.6 C) 97.9 F (36.6 C)    Left upper extremity in sling  nv intact distally No rashes or edema Not taken thru rom due to known fx  LABS  Recent Labs  05/13/16 0818  HGB 11.2*  HCT 35.7*  WBC 6.8  PLT 206     Recent Labs  05/13/16 0818 05/14/16 0303  NA 137 140  K 3.1* 3.5  BUN 8 9  CREATININE 0.46 0.54  GLUCOSE 107* 106*     Assessment/Plan:   Left radial head and neck fracture Recommend outpatient f/u in 1-2 weeks once discharged Continue with sling for comfort Pain management Will sign off for now, please contact for any other orthopedic issues     Merla Riches, MPAS, PA-C  05/15/2016, 12:17 PM

## 2016-05-15 NOTE — Progress Notes (Signed)
Subjective: CC better, no BM  Objective: Vital signs in last 24 hours: Temp:  [97.5 F (36.4 C)-98.6 F (37 C)] 97.8 F (36.6 C) (04/02 1275) Pulse Rate:  [80-138] 88 (04/02 0638) Resp:  [15-20] 20 (04/02 0406) BP: (143-175)/(75-110) 147/88 (04/02 0638) SpO2:  [94 %-96 %] 95 % (04/02 1700) Last BM Date: 05/14/16 (small, pasty)  Intake/Output from previous day: No intake/output data recorded. Intake/Output this shift: No intake/output data recorded.  General appearance: cooperative Resp: clear to auscultation bilaterally Cardio: regular rate and rhythm GI: soft, mod dist, NT  Neuro: awake and F/C  Lab Results: CBC   Recent Labs  05/13/16 0818  WBC 6.8  HGB 11.2*  HCT 35.7*  PLT 206   BMET  Recent Labs  05/13/16 0818 05/14/16 0303  NA 137 140  K 3.1* 3.5  CL 95* 97*  CO2 36* 37*  GLUCOSE 107* 106*  BUN 8 9  CREATININE 0.46 0.54  CALCIUM 10.5* 11.1*   PT/INR No results for input(s): LABPROT, INR in the last 72 hours. ABG No results for input(s): PHART, HCO3 in the last 72 hours.  Invalid input(s): PCO2, PO2  Studies/Results: Dg Chest 1 View  Result Date: 05/13/2016 CLINICAL DATA:  Left shoulder and forearm pain, patient has bruising and tenderness when palpating left arm. Little range of motion. Best obtainable due to patients pain and condition. EXAM: CHEST  1 VIEW COMPARISON:  05/09/2016 FINDINGS: Improved aeration.  Mild perihilar vascular congestion. Stable cardiomegaly.  Tortuous atheromatous aorta. No effusion.  No pneumothorax. Degenerative changes in bilateral shoulders. IMPRESSION: Improved aeration.  Stable cardiomegaly.  No acute findings. Electronically Signed   By: Lucrezia Europe M.D.   On: 05/13/2016 11:15   Dg Forearm Left  Result Date: 05/13/2016 CLINICAL DATA:  Left shoulder and forearm pain, patient has bruising and tenderness when palpating left arm. Little range of motion. Best obtainable due to patients pain and condition. EXAM: LEFT  FOREARM - 2 VIEW COMPARISON:  None. FINDINGS: Diffuse osteopenia. Chondrocalcinosis in the TFCC. Large Elbow effusion. Subtle cortical discontinuity in the radial head suggesting mildly impacted fracture. No definite subchondral step-off deformity. IMPRESSION: 1. Large elbow effusion with suspected radial head fracture, mildly impacted. Electronically Signed   By: Lucrezia Europe M.D.   On: 05/13/2016 11:09   Dg Tibia/fibula Left  Result Date: 05/14/2016 CLINICAL DATA:  Left lower extremity pain. EXAM: LEFT TIBIA AND FIBULA - 2 VIEW COMPARISON:  05/26/2010 left knee radiographs. FINDINGS: There is a long stem left knee arthroplasty device extending distally to the level of the distal left tibial shaft. No hardware fracture. There is severe periprosthetic lucency throughout the left tibial shaft with associated prominent expansile change. No acute osseous fracture. No suspicious focal osseous lesion. No evidence of malalignment at the left ankle on the provided views. Diffuse osteopenia. IMPRESSION: Long stem left knee arthroplasty device. Severe periprosthetic lucency with associated prominent expansile change throughout the left tibial shaft, indicating chronic hardware loosening. No acute osseous fracture. Diffuse osteopenia. Electronically Signed   By: Ilona Sorrel M.D.   On: 05/14/2016 12:08   Dg Abd Portable 1v  Result Date: 05/14/2016 CLINICAL DATA:  Abdominal distension. EXAM: PORTABLE ABDOMEN - 1 VIEW COMPARISON:  11/22/2015 FINDINGS: There is increased bowel gas, but no significant bowel dilation to suggest obstruction. Dense atherosclerotic calcifications are noted along the abdominal aorta and iliac vessels. Clips are upper quadrant reflect a prior cholecystectomy. The skeletal structures are demineralized but grossly intact. IMPRESSION: 1. No evidence of  bowel obstruction. 2. Increased bowel gas diffusely accounts for the abdominal distension. Electronically Signed   By: Lajean Manes M.D.   On:  05/14/2016 14:31   Dg Humerus Left  Result Date: 05/13/2016 CLINICAL DATA:  Left shoulder and forearm pain, patient has bruising and tenderness when palpating left arm. Little range of motion. Best obtainable due to patients pain and condition. EXAM: LEFT HUMERUS - 2+ VIEW COMPARISON:  04/14/2015 FINDINGS: Diffuse osteopenia. High-riding humeral head with progressive Subchondral sclerosis and remodeling. No fracture or dislocation. IMPRESSION: 1. Osteopenia without fracture or dislocation. 2. Progressive   Degenerative changes in the shoulder . Electronically Signed   By: Lucrezia Europe M.D.   On: 05/13/2016 11:12   Dg Femur Min 2 Views Left  Result Date: 05/14/2016 CLINICAL DATA:  Left lower extremity pain. EXAM: LEFT FEMUR 2 VIEWS COMPARISON:  05/26/2010 left knee radiographs. FINDINGS: There is a long stem left knee arthroplasty device extending proximally into the left proximal femoral shaft and distally into the left distal tibial shaft. No evidence of hardware fracture. There is periprosthetic lucency in the region of the distal left femoral metaepiphysis and in the visualized proximal left tibia. No acute osseous fracture in the left femur. No suspicious focal osseous lesion. No evidence of dislocation at the left hip. Severe tricompartmental osteoarthritis in the left knee with complete loss of joint space. IMPRESSION: Long stem left knee arthroplasty device. Periprosthetic lucency in the distal left femur and proximal left tibia, suggesting hardware loosening. No acute osseous fracture. Electronically Signed   By: Ilona Sorrel M.D.   On: 05/14/2016 12:06    Anti-infectives: Anti-infectives    None      Assessment/Plan: Fall, ground level  SDH left temporal lobe- [name], neurologically intact; repeat CT 3/27 stable Left tripod fractureWilburn Cornelia, MD; non-op; elevate, ice, liquid-soft diet, saline nasal spray  A.fib, Elevated INR- hold coumadin and do not resume, s/p reversal with Kcentra.  Home meds including cardizem Hypothyroidism - synthroid  Anxiety - home medications (xanax, Cymbalta)  Acute respiratory distress - improving, O2 stable on RA Suspected right rib fractures - IS/pulmonary toilet Left elbow radial head fracture - appreciate ortho consult, per Dr. Veverly Fells, nonop, sling x6-8 weeks, f/u ortho PRN FEN-dysphagia 2 diet, add mag citrate for constipation VTE:SCD's Pain: Tylenol PRN, fentanyl patch 75 mcg (home med) Dispo: SNF   LOS: 7 days    Georganna Skeans, MD, MPH, FACS Trauma: [phone] General Surgery: [phone]  4/2/2018Patient ID: Leah [name], female   DOB: 04-Nov-1927, 83 y.o.   MRN: [id number]

## 2016-05-15 NOTE — Progress Notes (Signed)
qPhysical Therapy Treatment Patient Details Name: Leah [name] MRN: [id number] DOB: Oct 15, 1927 Today's Date: 05/15/2016    History of Present Illness Pt is an 83 y.o. female admitted to ED on 05/08/16 after a fall at home. CT shows L temporal SDH; L tripod fx, L periorbital and facial contusion; C-spine clear. Pertinent PMH includes HTN, CAD, a-fib on Coumadin, RLS, and panic attacks.     PT Comments    Pt limited by lethargy and pain this session. Pt required total assist +2 for bed mobility and sitting balance. Pt unable to respond to verbal commands in sitting this session and unable to assist with balance. Pt did report pain in sitting and requested to lie down. RN present throughout session and assisted with mobility. Pt's family addressed current concerns about medications and dehydration with RN and MD during session. Current recommendations remain appropriate. Will continue to follow to maximize functional mobility independence.    Follow Up Recommendations  SNF;Supervision/Assistance - 24 hour     Equipment Recommendations  None recommended by PT    Recommendations for Other Services OT consult     Precautions / Restrictions Precautions Precautions: Fall Restrictions Weight Bearing Restrictions: Yes LUE Weight Bearing: Non weight bearing Other Position/Activity Restrictions: L radial fx; to be in sling     Mobility  Bed Mobility Overal bed mobility: Needs Assistance Bed Mobility: Supine to Sit;Sit to Supine     Supine to sit: Total assist;+2 for physical assistance Sit to supine: +2 for physical assistance;Total assist   General bed mobility comments: RN present in room to help with assist. Son assisted with sitting balance as needed. Pt unable to follow commands to assist with bed mobility this session. Total assist to scoot in bed.   Transfers                 General transfer comment: unable to attempt secondary to assist level and lethargy.    Ambulation/Gait                 Stairs            Wheelchair Mobility    Modified Rankin (Stroke Patients Only)       Balance Overall balance assessment: Needs assistance Sitting-balance support: Single extremity supported;Feet supported Sitting balance-[name]: Poor Sitting balance - Comments: required total assist to maintain sitting balance. Attempted to get pt to perform reaching activities, however, pt refusing and requesting to lay down secondary to increased pain. Pt's son's reporting she is requiring alot more assist this session than she previously required.                                     Cognition Arousal/Alertness: Lethargic Behavior During Therapy: Flat affect Overall Cognitive Status: Impaired/Different from baseline Area of Impairment: Orientation;Following commands;Memory;Attention                 Orientation Level: Disoriented to;Place;Time;Situation Current Attention Level: Sustained Memory: Decreased short-term memory;Decreased recall of precautions Following Commands: Follows one step commands inconsistently       General Comments: Pt not responding to one step commands this session. Lethargic throughout session, however, was able to report pain in sitting. Pt's son's present and expressed concerns with current pain med schedule and hydration level. RN and MD present when family expressing concerns.       Exercises      General Comments General comments (skin  integrity, edema, etc.): RN present throughout session and aware of current family concerns. RN aware of vitals throughout session; documented in flowsheet. Educated pt's family on current d/c recommendations. MD present at end of session to address pt family concerns.       Pertinent Vitals/Pain Pain Assessment: Faces Faces Pain Scale: Hurts whole lot Pain Location: ribs/back  Pain Descriptors / Indicators: Sore;Aching Pain Intervention(s): Limited  activity within patient's tolerance;Monitored during session;Repositioned;RN gave pain meds during session    Home Living                      Prior Function            PT Goals (current goals can now be found in the care plan section) Acute Rehab PT Goals Patient Stated Goal: Unable to state this session.  PT Goal Formulation: With patient Time For Goal Achievement: 05/24/16 Potential to Achieve Goals: Fair Progress towards PT goals: Not progressing toward goals - comment (secondary to lethargy and pain)    Frequency    Min 2X/week      PT Plan Current plan remains appropriate    Co-evaluation             End of Session Equipment Utilized During Treatment: Oxygen Activity Tolerance: Patient limited by lethargy Patient left: in bed;with call bell/phone within reach;with bed alarm set;with family/visitor present;with nursing/sitter in room Nurse Communication: Mobility status PT Visit Diagnosis: Other abnormalities of gait and mobility (R26.89);Muscle weakness (generalized) (M62.81)     Time: 9244-6286 PT Time Calculation (min) (ACUTE ONLY): 22 min  Charges:  $Therapeutic Activity: 8-22 mins                    G Codes:       Nicky Pugh, PT, DPT  Acute Rehabilitation Services  Pager: [phone]    Army Melia 05/15/2016, 3:57 PM

## 2016-05-16 LAB — BASIC METABOLIC PANEL
ANION GAP: 9 (ref 5–15)
BUN: 15 mg/dL (ref 6–20)
CALCIUM: 11.6 mg/dL — AB (ref 8.9–10.3)
CO2: 31 mmol/L (ref 22–32)
Chloride: 104 mmol/L (ref 101–111)
Creatinine, Ser: 0.65 mg/dL (ref 0.44–1.00)
GLUCOSE: 116 mg/dL — AB (ref 65–99)
POTASSIUM: 3.1 mmol/L — AB (ref 3.5–5.1)
SODIUM: 144 mmol/L (ref 135–145)

## 2016-05-16 MED ORDER — POLYETHYLENE GLYCOL 3350 17 G PO PACK: 17.0000 g | PACK | Freq: Every day | ORAL | 0 refills | Status: AC

## 2016-05-16 MED ORDER — FLEET ENEMA 7-19 GM/118ML RE ENEM
1.0000 | ENEMA | Freq: Once | RECTAL | Status: AC
  Administered 2016-05-16: 1 via RECTAL
  Filled 2016-05-16: qty 1

## 2016-05-16 MED ORDER — ALPRAZOLAM 0.5 MG PO TABS: 0.5000 mg | ORAL_TABLET | Freq: Every morning | ORAL | 0 refills | Status: AC

## 2016-05-16 MED ORDER — FENTANYL 50 MCG/HR TD PT72: 50.0000 ug | MEDICATED_PATCH | TRANSDERMAL | 0 refills | Status: AC

## 2016-05-16 MED ORDER — DOCUSATE SODIUM 100 MG PO CAPS: 100.0000 mg | ORAL_CAPSULE | Freq: Two times a day (BID) | ORAL | 0 refills | Status: AC

## 2016-05-16 MED ORDER — HYDROCODONE-ACETAMINOPHEN 5-325 MG PO TABS: 1.0000 | ORAL_TABLET | Freq: Four times a day (QID) | ORAL | 0 refills | Status: AC | PRN

## 2016-05-16 MED ORDER — ONDANSETRON HCL 4 MG PO TABS: 4.0000 mg | ORAL_TABLET | Freq: Four times a day (QID) | ORAL | 0 refills | Status: AC | PRN

## 2016-05-16 MED ORDER — SODIUM CHLORIDE 0.9 % IV SOLN
INTRAVENOUS | Status: DC
  Administered 2016-05-16: 11:00:00 via INTRAVENOUS
  Filled 2016-05-16 (×3): qty 1000

## 2016-05-16 MED ORDER — ACETAMINOPHEN 325 MG PO TABS: 325.0000 mg | ORAL_TABLET | Freq: Four times a day (QID) | ORAL | Status: AC | PRN

## 2016-05-16 NOTE — Clinical Social Work Note (Signed)
CSW met with sons- Kasandra Knudsen and Herbie Baltimore at bedside with pt. CSW addressed concerns about d/c and bed decision. Sons upset because they feel d/c is sudden and pt is not ready to d/c. CSW explained that decision is made by MD when pt is medically stable and ready to begin rehab. CSW also discussed applying for Medicaid if planning for long term. Kasandra Knudsen stated he is already familiar with that information. CSW discussed expectation of bed choice being provided tomorrow and pt more than likely being d/c also. Pt stated he understood and they will visit [facility] and [employer]. CSW also provided phone number for contact. CSW will continue to follow for d/c needs.   Oretha Ellis, [place], [place] Work [phone]

## 2016-05-16 NOTE — Discharge Instructions (Signed)
Care Instructions:  Minimally displaced left radial head and neck fracture - treatment in a sling for 6-8 weeks The elbow will heal on its own.  Ortho follow up as needed after discharge for any problems.  - Recommend nonweight bearing for 6-8 weeks. Keep sling on during this time except for bathing and exercises - Remove sling for shoulder pendulums and gentle passive elbow ROM - encourage active wrist and finger ROM  Facial Fracture precautions: 1. Elevate head of bed 2. Ice compress to periorbital region 3. Avoid additional trauma, nose blowing or sneezing 4. Liquid and soft diet as tolerated 5. Saline nasal spray 4 times a day and when necessary  Pt will need assistance with feeding.

## 2016-05-16 NOTE — Care Management Note (Signed)
Case Management Note  Patient Details  Name: Leah [name] MRN: [id number] Date of Birth: Jun 23, 1927  Subjective/Objective:   Pt admitted on 05/08/16 s/p fall with SDH Lt temporal lobe and Lt tripod fx.  PTA, pt resided at home with son; she ambulates with walker.                 Action/Plan: Noted Rapid Response notes and transfer to SDU today.  Recommend PT/OT evaluations when medically stable.    Expected Discharge Date:  05/16/16               Expected Discharge Plan:  Skilled Nursing Facility  In-House Referral:  Clinical Social Work  Discharge planning Services  CM Consult  Post Acute Care Choice:    Choice offered to:     DME Arranged:    DME Agency:     HH Arranged:    [place] Agency:     Status of Service:  Completed, signed off  If discussed at [employer], dates discussed:    Additional Comments:  05/11/16 PT recommending SNF at discharge.  CSW following to facilitate dc to SNF upon medical stability.  Will follow progress.  Corinna Gab, RN, BSN   05/16/16 J. Zayden Maffei,RN, BSN Pt medically stable for discharge to SNF today.  CSW notified of dc today.   Reinaldo Raddle, RN, BSN  Trauma/Neuro ICU Case Manager [phone]

## 2016-05-16 NOTE — Care Management Important Message (Signed)
Important Message  Patient Details  Name: Leah [name] MRN: [id number] Date of Birth: 13-Jun-1927   Medicare Important Message Given:  Yes    Orbie Pyo 05/16/2016, 2:24 PM

## 2016-05-16 NOTE — Progress Notes (Signed)
[facility] Discharge Summary   Patient ID: Leah [name] MRN: [id number] DOB/AGE: Mar 08, 1927 83 y.o.  Admit date: 05/08/2016 Discharge date: 05/16/2016  Admitting Diagnosis: SDH left temporal lobe Left tripod fracture  Left elbow radial head fracture    Discharge Diagnosis Patient Active Problem List   Diagnosis Date Noted  . SDH (subdural hematoma) ([place]) 05/08/2016  . Dehydration   . Diarrhea 11/21/2015  . Hypokalemia due to loss of potassium 11/21/2015  . Hypothyroidism 11/21/2015  . Hypertension 11/21/2015  . CAD (coronary artery disease) 11/21/2015  . Panic attacks 11/21/2015  . Restless legs syndrome 11/21/2015    Consultants Dr. [name]-Neurosurgery  Dr. Lovenia Shuck Dr. Veverly Fells, Orthopedics  Imaging: CT Head WO contrast: Result date 05/09/2016 CLINICAL DATA:  83 year old female with confusion. Recent fall. Subsequent encounter. EXAM: CT HEAD WITHOUT CONTRAST COMPARISON:  05/08/2016. FINDINGS: Brain: Posterior left temporal subdural hematoma with maximal thickness of 5.5 mm similar to prior exam. I suspect there is adjacent small amount of subarachnoid hemorrhage or hemorrhagic contusion involving the left temporal lobe. Left frontal extra-axial 9 mm focal blood collection. Left frontal convexity and left cerebellopontine angle meningiomas as previously described. No acute thrombotic infarct. Global atrophy Vascular: Calcified vasculature. Skull: Hyperostosis frontalis.  No skull fracture. Sinuses/Orbits: Complex fracture left maxillary sinus with herniation of fat into this region in addition to presence of blood/fluid. Left orbital floor fracture. Left zygomatic arc fracture. Left lateral orbital wall fracture with slight inward displacement which could eventually impinge upon the left lateral rectus muscle.  CT Cervical Spine, Head, and Maxillofacial without contrast: Resulted 05/08/2016 CLINICAL DATA:  Initial evaluation for acute trauma, fall. EXAM: CT HEAD  WITHOUT CONTRAST, CT MAXILLOFACIAL WITHOUT CONTRAST, CT CERVICAL SPINE WITHOUT CONTRAST COMPARISON:  Prior CT from 12/27/2015. FINDINGS: Brain: Advanced cerebral atrophy with mild to moderate chronic microvascular ischemic disease. There is subtle hyperdensity overlying the left temporal occipital convexity measuring up to 6 mm, suspicious for small amount of extra-axial hemorrhage (series 201, image 12). No other acute intracranial hemorrhage. No evidence for acute large vessel territory infarct. 2.6 cm meningioma overlies the left frontal convexity (series 201, image 28). Additional partially calcified 1.8 cm meningioma present at the left cerebellopontine angle (series 201, image 9). No associated mass effect or edema. No other mass lesion. No midline shift. No hydrocephalus. Vascular: No asymmetric hyperdense vessel. Scattered vascular calcifications noted within the carotid siphons. Skull: Left periorbital and facial contusion. Scalp soft tissue otherwise unremarkable. Calvarium intact. Other: Mastoids are clear. CT MAXILLOFACIAL FINDINGS Osseous: Acute comminuted fractures involve the left zygomatic arch without significant displacement. There are acute fractures through the anterior and posterior walls of the left maxillary sinus. Associated fractures of the left orbital floor and lateral wall of the bony left orbit. Constellation consistent with an acute tripod fracture. No significant displacement about the left orbital floor fracture. Right zygomatic arch and maxilla intact. Pterygoid plates intact. Nasal bones intact. Nasal septum bowed to the right but intact. No acute mandibular fracture. Mandibular condyles normally situated. Patient is edentulous. Degenerative changes noted about the temporomandibular joints. Orbits: Globes intact. Patient status post lens extraction bilaterally. Acute fractures of the left orbital floor, lateral wall of the bony left orbit, with extension towards the left  orbital roof. No significant displacement at the left orbital floor fracture. Extra-ocular muscles remain normally positioned. No significant retro-orbital hematoma. Bony right orbit intact. Sinuses: Blood largely opacifies the left maxillary sinus. There is herniation of fracture antral fat into the sinus itself (series 315,  image 45). Paranasal sinuses are otherwise clear. Soft tissues: Left periorbital and facial contusion.  CT CERVICAL SPINE FINDINGS Alignment: Severe kyphotic angulation with exaggeration of the normal cervical lordosis, severely limiting the study. Trace anterolisthesis of C7 on T1. No other obvious listhesis. Skull base and vertebrae: Skullbase intact. Normal C1-2 articulations preserved. Dens intact. Vertebral body heights maintained. No obvious fracture. Soft tissues and spinal canal: Visualized soft tissues of the neck grossly unremarkable. No definite prevertebral edema. Disc levels: Moderate degenerative spondylolysis noted at C6-7. No other significant degenerative changes. Upper chest: Prominent atheromatous plaque noted within the visualized aorta. Emphysematous changes noted. No apical pneumothorax. Scattered atelectatic changes noted within the visualized lungs.  Dg Tibia/fibula Left: Result Date: 05/14/2016 CLINICAL DATA:  Left lower extremity pain. EXAM: LEFT TIBIA AND FIBULA - 2 VIEW COMPARISON:  05/26/2010 left knee radiographs. FINDINGS: There is a long stem left knee arthroplasty device extending distally to the level of the distal left tibial shaft. No hardware fracture. There is severe periprosthetic lucency throughout the left tibial shaft with associated prominent expansile change. No acute osseous fracture. No suspicious focal osseous lesion. No evidence of malalignment at the left ankle on the provided views. Diffuse osteopenia. IMPRESSION: Long stem left knee arthroplasty device. Severe periprosthetic lucency with associated prominent expansile change throughout the  left tibial shaft, indicating chronic hardware loosening. No acute osseous fracture. Diffuse osteopenia. Electronically Signed   By: Ilona Sorrel M.D.   On: 05/14/2016 12:08   Dg Abd Portable 1v: Result Date: 05/14/2016 CLINICAL DATA:  Abdominal distension. EXAM: PORTABLE ABDOMEN - 1 VIEW COMPARISON:  11/22/2015 FINDINGS: There is increased bowel gas, but no significant bowel dilation to suggest obstruction. Dense atherosclerotic calcifications are noted along the abdominal aorta and iliac vessels. Clips are upper quadrant reflect a prior cholecystectomy. The skeletal structures are demineralized but grossly intact. IMPRESSION: 1. No evidence of bowel obstruction. 2. Increased bowel gas diffusely accounts for the abdominal distension. Electronically Signed   By: Lajean Manes M.D.   On: 05/14/2016 14:31   Dg Femur Min 2 Views Left: Result Date: 05/14/2016 CLINICAL DATA:  Left lower extremity pain. EXAM: LEFT FEMUR 2 VIEWS COMPARISON:  05/26/2010 left knee radiographs. FINDINGS: There is a long stem left knee arthroplasty device extending proximally into the left proximal femoral shaft and distally into the left distal tibial shaft. No evidence of hardware fracture. There is periprosthetic lucency in the region of the distal left femoral metaepiphysis and in the visualized proximal left tibia. No acute osseous fracture in the left femur. No suspicious focal osseous lesion. No evidence of dislocation at the left hip. Severe tricompartmental osteoarthritis in the left knee with complete loss of joint space. IMPRESSION: Long stem left knee arthroplasty device. Periprosthetic lucency in the distal left femur and proximal left tibia, suggesting hardware loosening. No acute osseous fracture. Electronically Signed   By: Ilona Sorrel M.D.   On: 05/14/2016 12:06    Procedures None  HPI: SHERIL HAMMOND is a 83 y.o. female who presented to the Emergency Department complaining of acute onset, moderate left eye pain s/p a  fall which occurred 1.5 hours PTA. Pt states she was sitting in a chair when she was reaching for a glass of water and fell face forward hitting her head. Pt also had associated skin tears and bruising under her left eye. Relative also reported left jaw swelling. No alleviating factors noted. Pt denied LOC, abdominal pain or any other associated symptoms. She remembers the event. She  has been functioning independently up until this point. She does have a history of falls in the past. Pt states she is on coumadin for a. fib and notes her last INR was therapeutic 2 weeks ago. Wynonia Lawman is cardiologist.  INR is 2.75 on arrival.    Hospital Course:  In the ED, workup showed subdural hematoma of left temporal lobe, left tripod fracture, left radial head fracture and suspected right rib fracture. Pt was admitted to the floor to the trauma service. Neurosurgery was consulted and there was no indication for surgical intervention for her SDH. Coumadin was stopped and reversed with Kcentra. ENT was consulted and there was no indication for surgical intervention. Hospital day (HD) #1 the pt was moved to a stepdown unit for respiratory distress. Repeat head CT revealed no evidence of any enlarged hemorrhage. On HD #3 pt was moved to the floor. Pt respiratory status improved.  On HD #5 pt was noted to have left arm pain.  Minimally displaced left radial head and neck fracture was noted on xray. Orthopedics was consulted and recommended sling for 6-8 weeks and no surgical intervention. Pt had pain in LLE, doppler US revealed no DVT. Diet was advanced as tolerated. On HD#8 the patient was voiding well, tolerating diet, ambulating well, pain well controlled, vital signs stable, and felt stable for discharge to [facility] in [place].   The patient was discharged in good condition.  The [place] substance control database was reviewed prior to prescribing this patient pain medication.  I did not see nor take part in this patient's care.  All of the information provided in this discharge summary was taken from the pt's EMR.   Allergies as of 05/16/2016      Reactions   Cefdinir Diarrhea   Codeine Other (See Comments)   nervous      Medication List    STOP taking these medications   azithromycin 500 MG tablet Commonly known as:  ZITHROMAX   fentaNYL 75 MCG/HR Commonly known as:  DURAGESIC - dosed mcg/hr Replaced by:  fentaNYL 50 MCG/HR   HYDROcodone-acetaminophen 10-325 MG tablet Commonly known as:  NORCO Replaced by:  HYDROcodone-acetaminophen 5-325 MG tablet   senna-docusate 8.6-50 MG tablet Commonly known as:  Senokot-S   warfarin 2.5 MG tablet Commonly known as:  COUMADIN     TAKE these medications   acetaminophen 325 MG tablet Commonly known as:  TYLENOL Take 1 tablet (325 mg total) by mouth every 6 (six) hours as needed for mild pain.   albuterol 108 (90 Base) MCG/ACT inhaler Commonly known as:  PROVENTIL HFA;VENTOLIN HFA Inhale 2 puffs into the lungs every 6 (six) hours as needed for wheezing or shortness of breath.   ALPRAZolam 0.5 MG tablet Commonly known as:  XANAX Take 1 tablet (0.5 mg total) by mouth every morning. Start taking on:  05/28/2016 What changed:  how much to take  when to take this  additional instructions   aspirin EC 81 MG tablet Take 81 mg by mouth daily.   CARTIA XT 240 MG 24 hr capsule Generic drug:  diltiazem Take 240 mg by mouth daily.   docusate sodium 100 MG capsule Commonly known as:  COLACE Take 1 capsule (100 mg total) by mouth 2 (two) times daily.   DULoxetine 30 MG capsule Commonly known as:  CYMBALTA Take 30 mg by mouth 3 (three) times daily.   fentaNYL 50 MCG/HR Commonly known as:  DURAGESIC - dosed mcg/hr Place 1 patch (50 mcg total) onto  the skin every 3 (three) days. Start taking on:  05/18/2016 Replaces:  fentaNYL 75 MCG/HR   furosemide 80 MG tablet Commonly known as:  LASIX Take 80 mg by mouth daily as needed for fluid or edema  (swelling).   HYDROcodone-acetaminophen 5-325 MG tablet Commonly known as:  NORCO/VICODIN Take 1 tablet by mouth every 6 (six) hours as needed for moderate pain. Replaces:  HYDROcodone-acetaminophen 10-325 MG tablet   levothyroxine 137 MCG tablet Commonly known as:  SYNTHROID, LEVOTHROID Take 137 mcg by mouth daily before breakfast.   magnesium chloride 64 MG Tbec SR tablet Commonly known as:  SLOW-MAG Take 1 tablet (64 mg total) by mouth daily.   ondansetron 4 MG tablet Commonly known as:  ZOFRAN Take 1 tablet (4 mg total) by mouth every 6 (six) hours as needed for nausea.   polyethylene glycol packet Commonly known as:  MIRALAX / GLYCOLAX Take 17 g by mouth daily. Start taking on:  05/30/2016   potassium chloride SA 15 MEQ tablet Commonly known as:  KLOR-CON M15 Take 2 tablets (30 mEq total) by mouth daily. USE WHEN TAKING LASIX What changed:  when to take this  reasons to take this  additional instructions   saccharomyces boulardii 250 MG capsule Commonly known as:  FLORASTOR Take 250 mg by mouth 2 (two) times daily.   tamsulosin 0.4 MG Caps capsule Commonly known as:  FLOMAX Take 0.4 mg by mouth daily.          Signed: Jackson Latino, [facility] Pager [phone]

## 2016-05-16 NOTE — Progress Notes (Signed)
Patient ID: Leah [name], female   DOB: 16-Mar-1927, 83 y.o.   MRN: [id number] I met with her sons to discuss rehab at [facility]. They received bed offers last week but have not made a decision. They are concerned regarding Medicare paying for the rehab. I recommend that they speak with the social worker again about this. Anticipate placement soon.  Georganna Skeans, MD, MPH, FACS Trauma: [phone] General Surgery: [phone]

## 2016-05-16 NOTE — Progress Notes (Signed)
[facility] Progress Note     Subjective:  Leah [name] is an 83 year old female with a PMHx including CAD, HTN, hypothyroidism, anxiety. She has a history of falls in the past.   She presented to the ED on 05/08/16 following a fall in her home. She was subsequently diagnosed with a SDH at the left temporal lobe, left tripod fracture that is stable. A repeat CT scan performed on 3/27 shows a stable bleed. She has not experienced any acute changes in the meantime. She also sustained a left elbow radial head fracture and was placed in a sling for 6-8 weeks with orthopedic follow up.  The patient is very lethargic but arouses briefly with voice and touch stimulation. She is able to answer only a few questions before falling back asleep. She states that she is not in any pain and she is not having any respiratory issues on her oxygen supplementation.  ROS unable to be performed due to patient's level of consciousness.   Per nursing, no change or significant events overnight. Patient has not had a bowel movement since Sunday (2 small BMs following mag citrate and suppository)  Objective: Vital signs in last 24 hours: Temp:  [97.4 F (36.3 C)-98.9 F (37.2 C)] 98.4 F (36.9 C) (04/03 0525) Pulse Rate:  [60-136] 107 (04/03 0749) Resp:  [18-20] 18 (04/03 0525) BP: (143-175)/(82-104) 163/90 (04/03 0525) SpO2:  [91 %-100 %] 97 % (04/03 0525) Last BM Date: 05/14/16  Intake/Output from previous day: No intake/output data recorded. Intake/Output this shift: No intake/output data recorded.  PE: Gen:  Drowsy, minimally alert, NAD, sleeping in bed with nasal cannula HEENT: PERRL Card:  Irregular rhythm noted. No murmurs gallops or rubs. Radial and pedal pulses noted. Pulm:  Non-labored, clear to auscultation bilaterally Abd: Soft, non-tender, non-distended, normoactive bowel sounds. Ext:  Touch sensation in tact in upper extremities. Lower extremities unable to be performed.    Lab Results:   Recent Labs  05/13/16 0818  WBC 6.8  HGB 11.2*  HCT 35.7*  PLT 206   BMET  Recent Labs  05/14/16 0303 05/16/16 0455  NA 140 144  K 3.5 3.1*  CL 97* 104  CO2 37* 31  GLUCOSE 106* 116*  BUN 9 15  CREATININE 0.54 0.65  CALCIUM 11.1* 11.6*   PT/INR No results for input(s): LABPROT, INR in the last 72 hours. CMP     Component Value Date/Time   NA 144 05/16/2016 0455   K 3.1 (L) 05/16/2016 0455   CL 104 05/16/2016 0455   CO2 31 05/16/2016 0455   GLUCOSE 116 (H) 05/16/2016 0455   BUN 15 05/16/2016 0455   CREATININE 0.65 05/16/2016 0455   CALCIUM 11.6 (H) 05/16/2016 0455   CALCIUM 10.0 09/15/2010 0530   PROT 6.1 (L) 12/27/2015 0959   ALBUMIN 3.3 (L) 12/27/2015 0959   AST 16 12/27/2015 0959   ALT 10 (L) 12/27/2015 0959   ALKPHOS 112 12/27/2015 0959   BILITOT 0.9 12/27/2015 0959   GFRNONAA >60 05/16/2016 0455   GFRAA >60 05/16/2016 0455   Lipase     Component Value Date/Time   LIPASE 23 11/21/2015 1343       Studies/Results: Dg Tibia/fibula Left  Result Date: 05/14/2016 CLINICAL DATA:  Left lower extremity pain. EXAM: LEFT TIBIA AND FIBULA - 2 VIEW COMPARISON:  05/26/2010 left knee radiographs. FINDINGS: There is a long stem left knee arthroplasty device extending distally to the level of the distal left tibial shaft. No  hardware fracture. There is severe periprosthetic lucency throughout the left tibial shaft with associated prominent expansile change. No acute osseous fracture. No suspicious focal osseous lesion. No evidence of malalignment at the left ankle on the provided views. Diffuse osteopenia. IMPRESSION: Long stem left knee arthroplasty device. Severe periprosthetic lucency with associated prominent expansile change throughout the left tibial shaft, indicating chronic hardware loosening. No acute osseous fracture. Diffuse osteopenia. Electronically Signed   By: Ilona Sorrel M.D.   On: 05/14/2016 12:08   Dg Abd Portable 1v  Result Date:  05/14/2016 CLINICAL DATA:  Abdominal distension. EXAM: PORTABLE ABDOMEN - 1 VIEW COMPARISON:  11/22/2015 FINDINGS: There is increased bowel gas, but no significant bowel dilation to suggest obstruction. Dense atherosclerotic calcifications are noted along the abdominal aorta and iliac vessels. Clips are upper quadrant reflect a prior cholecystectomy. The skeletal structures are demineralized but grossly intact. IMPRESSION: 1. No evidence of bowel obstruction. 2. Increased bowel gas diffusely accounts for the abdominal distension. Electronically Signed   By: Lajean Manes M.D.   On: 05/14/2016 14:31   Dg Femur Min 2 Views Left  Result Date: 05/14/2016 CLINICAL DATA:  Left lower extremity pain. EXAM: LEFT FEMUR 2 VIEWS COMPARISON:  05/26/2010 left knee radiographs. FINDINGS: There is a long stem left knee arthroplasty device extending proximally into the left proximal femoral shaft and distally into the left distal tibial shaft. No evidence of hardware fracture. There is periprosthetic lucency in the region of the distal left femoral metaepiphysis and in the visualized proximal left tibia. No acute osseous fracture in the left femur. No suspicious focal osseous lesion. No evidence of dislocation at the left hip. Severe tricompartmental osteoarthritis in the left knee with complete loss of joint space. IMPRESSION: Long stem left knee arthroplasty device. Periprosthetic lucency in the distal left femur and proximal left tibia, suggesting hardware loosening. No acute osseous fracture. Electronically Signed   By: Ilona Sorrel M.D.   On: 05/14/2016 12:06    Anti-infectives: Anti-infectives    None       Assessment/Plan  1. SDH; left temporal lobe - Stable, Repeat CT 3/27 shows no change. Neurologically intact: Dr. Ellene Route consulted previously - continue to monitor neuro status - Holding coumadin due to active intracranial bleed - Continue PT/OT evaluations - recommending SNF placement post discharge. -  Dysphagia 2 diet currently; continue O2 therapy  2. Left tripod fracture: - Stable; Dr. Wilburn Cornelia consulted previously - Continue with conservative management  3. Afib with elevated INR - INR returned to normal at 1.15; continue to monitor - DVT/PE prophylaxis with SCDs  4. Left radial head fracture: - Stable; Dr. Veverly Fells of ortho consulted previously - Continue sling for 6-8 weeks with outpatient follow up - Pain well managed with tylenol and fentanyl patches  5. Hypothyroidism  - continue synthroid  6. Suspected right rib fracture  - continue incentive spirometry and pulmonary toilet     LOS: 8 days    Roselle Locus , PA-S  Not taking a lot PO and hypokalemic so will add gentle IVF with K. Will check with CSW regarding SNF Fleets enema today Patient examined and I agree with the assessment and plan  Georganna Skeans, MD, MPH, FACS Trauma: [phone] General Surgery: [phone]  05/16/2016 10:20 AM

## 2016-05-17 DIAGNOSIS — S065X0D Traumatic subdural hemorrhage without loss of consciousness, subsequent encounter: Secondary | ICD-10-CM | POA: Diagnosis not present

## 2016-05-17 DIAGNOSIS — S52122D Displaced fracture of head of left radius, subsequent encounter for closed fracture with routine healing: Secondary | ICD-10-CM | POA: Diagnosis not present

## 2016-05-17 DIAGNOSIS — R110 Nausea: Secondary | ICD-10-CM | POA: Diagnosis not present

## 2016-05-17 DIAGNOSIS — E860 Dehydration: Secondary | ICD-10-CM | POA: Diagnosis not present

## 2016-05-17 DIAGNOSIS — Z87891 Personal history of nicotine dependence: Secondary | ICD-10-CM | POA: Diagnosis not present

## 2016-05-17 DIAGNOSIS — W1830XA Fall on same level, unspecified, initial encounter: Secondary | ICD-10-CM | POA: Diagnosis present

## 2016-05-17 DIAGNOSIS — Y92009 Unspecified place in unspecified non-institutional (private) residence as the place of occurrence of the external cause: Secondary | ICD-10-CM | POA: Diagnosis not present

## 2016-05-17 DIAGNOSIS — R600 Localized edema: Secondary | ICD-10-CM | POA: Diagnosis not present

## 2016-05-17 DIAGNOSIS — I10 Essential (primary) hypertension: Secondary | ICD-10-CM | POA: Diagnosis not present

## 2016-05-17 DIAGNOSIS — Z7901 Long term (current) use of anticoagulants: Secondary | ICD-10-CM | POA: Diagnosis not present

## 2016-05-17 DIAGNOSIS — R0603 Acute respiratory distress: Secondary | ICD-10-CM | POA: Diagnosis not present

## 2016-05-17 DIAGNOSIS — S0012XA Contusion of left eyelid and periocular area, initial encounter: Secondary | ICD-10-CM | POA: Diagnosis not present

## 2016-05-17 DIAGNOSIS — F410 Panic disorder [episodic paroxysmal anxiety] without agoraphobia: Secondary | ICD-10-CM | POA: Diagnosis not present

## 2016-05-17 DIAGNOSIS — S2231XA Fracture of one rib, right side, initial encounter for closed fracture: Secondary | ICD-10-CM | POA: Diagnosis not present

## 2016-05-17 DIAGNOSIS — E039 Hypothyroidism, unspecified: Secondary | ICD-10-CM | POA: Diagnosis not present

## 2016-05-17 DIAGNOSIS — I4891 Unspecified atrial fibrillation: Secondary | ICD-10-CM | POA: Diagnosis not present

## 2016-05-17 DIAGNOSIS — Z6825 Body mass index (BMI) 25.0-25.9, adult: Secondary | ICD-10-CM | POA: Diagnosis not present

## 2016-05-17 DIAGNOSIS — I6200 Nontraumatic subdural hemorrhage, unspecified: Secondary | ICD-10-CM | POA: Diagnosis not present

## 2016-05-17 DIAGNOSIS — R488 Other symbolic dysfunctions: Secondary | ICD-10-CM | POA: Diagnosis not present

## 2016-05-17 DIAGNOSIS — R1319 Other dysphagia: Secondary | ICD-10-CM | POA: Diagnosis not present

## 2016-05-17 DIAGNOSIS — S0232XA Fracture of orbital floor, left side, initial encounter for closed fracture: Secondary | ICD-10-CM | POA: Diagnosis not present

## 2016-05-17 DIAGNOSIS — E569 Vitamin deficiency, unspecified: Secondary | ICD-10-CM | POA: Diagnosis not present

## 2016-05-17 DIAGNOSIS — E43 Unspecified severe protein-calorie malnutrition: Secondary | ICD-10-CM | POA: Insufficient documentation

## 2016-05-17 DIAGNOSIS — S0240FA Zygomatic fracture, left side, initial encounter for closed fracture: Secondary | ICD-10-CM | POA: Diagnosis not present

## 2016-05-17 DIAGNOSIS — R0602 Shortness of breath: Secondary | ICD-10-CM | POA: Diagnosis not present

## 2016-05-17 DIAGNOSIS — K567 Ileus, unspecified: Secondary | ICD-10-CM | POA: Diagnosis not present

## 2016-05-17 DIAGNOSIS — M6281 Muscle weakness (generalized): Secondary | ICD-10-CM | POA: Diagnosis not present

## 2016-05-17 DIAGNOSIS — R4182 Altered mental status, unspecified: Secondary | ICD-10-CM | POA: Diagnosis not present

## 2016-05-17 DIAGNOSIS — I2510 Atherosclerotic heart disease of native coronary artery without angina pectoris: Secondary | ICD-10-CM | POA: Diagnosis not present

## 2016-05-17 DIAGNOSIS — R791 Abnormal coagulation profile: Secondary | ICD-10-CM | POA: Diagnosis not present

## 2016-05-17 DIAGNOSIS — S52122A Displaced fracture of head of left radius, initial encounter for closed fracture: Secondary | ICD-10-CM | POA: Diagnosis not present

## 2016-05-17 DIAGNOSIS — G2581 Restless legs syndrome: Secondary | ICD-10-CM | POA: Diagnosis not present

## 2016-05-17 DIAGNOSIS — F05 Delirium due to known physiological condition: Secondary | ICD-10-CM | POA: Diagnosis not present

## 2016-05-17 DIAGNOSIS — Z23 Encounter for immunization: Secondary | ICD-10-CM | POA: Diagnosis not present

## 2016-05-17 DIAGNOSIS — S02402D Zygomatic fracture, unspecified, subsequent encounter for fracture with routine healing: Secondary | ICD-10-CM | POA: Diagnosis not present

## 2016-05-17 DIAGNOSIS — S0240DA Maxillary fracture, left side, initial encounter for closed fracture: Secondary | ICD-10-CM | POA: Diagnosis not present

## 2016-05-17 DIAGNOSIS — R197 Diarrhea, unspecified: Secondary | ICD-10-CM | POA: Diagnosis not present

## 2016-05-17 DIAGNOSIS — R339 Retention of urine, unspecified: Secondary | ICD-10-CM | POA: Diagnosis not present

## 2016-05-17 DIAGNOSIS — S065X0A Traumatic subdural hemorrhage without loss of consciousness, initial encounter: Secondary | ICD-10-CM | POA: Diagnosis not present

## 2016-05-17 DIAGNOSIS — R52 Pain, unspecified: Secondary | ICD-10-CM | POA: Diagnosis not present

## 2016-05-17 DIAGNOSIS — S065X9A Traumatic subdural hemorrhage with loss of consciousness of unspecified duration, initial encounter: Secondary | ICD-10-CM | POA: Diagnosis not present

## 2016-05-17 DIAGNOSIS — E876 Hypokalemia: Secondary | ICD-10-CM | POA: Diagnosis not present

## 2016-05-17 DIAGNOSIS — R451 Restlessness and agitation: Secondary | ICD-10-CM | POA: Diagnosis not present

## 2016-05-17 DIAGNOSIS — Z9181 History of falling: Secondary | ICD-10-CM | POA: Diagnosis not present

## 2016-05-17 MED ORDER — ACETAMINOPHEN 650 MG RE SUPP: 650.0000 mg | RECTAL | Status: DC | PRN

## 2016-05-17 MED ORDER — BOOST PLUS PO LIQD: 237.0000 mL | Freq: Three times a day (TID) | ORAL | Status: DC

## 2016-05-17 MED ORDER — POTASSIUM CHLORIDE IN NACL 20-0.9 MEQ/L-% IV SOLN
INTRAVENOUS | Status: DC
  Administered 2016-05-17: 06:00:00 via INTRAVENOUS
  Filled 2016-05-17: qty 1000

## 2016-05-17 MED ORDER — BOOST PLUS PO LIQD
237.0000 mL | Freq: Two times a day (BID) | ORAL | Status: DC
  Administered 2016-05-17: 237 mL via ORAL
  Filled 2016-05-17 (×3): qty 237

## 2016-05-17 MED ORDER — APIXABAN 2.5 MG PO TABS: 2.5000 mg | ORAL_TABLET | Freq: Two times a day (BID) | ORAL | Status: AC

## 2016-05-17 MED ORDER — BOOST PLUS PO LIQD: 237.0000 mL | Freq: Two times a day (BID) | ORAL | 0 refills | Status: AC

## 2016-05-17 MED ORDER — APIXABAN 2.5 MG PO TABS
2.5000 mg | ORAL_TABLET | Freq: Two times a day (BID) | ORAL | Status: DC
  Administered 2016-05-17: 2.5 mg via ORAL
  Filled 2016-05-17: qty 1

## 2016-05-17 NOTE — Clinical Social Work Placement (Signed)
   CLINICAL SOCIAL WORK PLACEMENT  NOTE  Date:  06/10/2016  Patient Details  Name: Leah [name] MRN: [id number] Date of Birth: 04/11/1927  Clinical Social Work is seeking post-discharge placement for this patient at the [place] level of care (*CSW will initial, date and re-position this form in  chart as items are completed):  Yes   Patient/family provided with [facility] Work Department's list of facilities offering this level of care within the geographic area requested by the patient (or if unable, by the patient's family).  Yes   Patient/family informed of their freedom to choose among providers that offer the needed level of care, that participate in Medicare, Medicaid or managed care program needed by the patient, have an available bed and are willing to accept the patient.  Yes   Patient/family informed of [facility]'s ownership interest in [facility] and [facility], as well as of the fact that they are under no obligation to receive care at these facilities.  PASRR submitted to EDS on       PASRR number received on       Existing PASRR number confirmed on 05/10/16     FL2 transmitted to all facilities in geographic area requested by pt/family on 05/10/16     FL2 transmitted to all facilities within larger geographic area on       Patient informed that his/her managed care company has contracts with or will negotiate with certain facilities, including the following:        Yes   Patient/family informed of bed offers received.  Patient chooses bed at [facility]     Physician recommends and patient chooses bed at      Patient to be transferred to [facility] on 06/09/2016.  Patient to be transferred to facility by [facility]     Patient family notified on 05/14/2016 of transfer.  Name of family member notified:  Eugenia Pancoast     PHYSICIAN       Additional Comment:  Pt is ready for discharge today and will go to [facility].  Son-Danny is aware and agreeable to discharge plan. Son still had questions about lasix medication and questioned if pt was ready for d/c. CSW called MD Hulen Skains while still at bedside. Per MD pt ready for d/c and lasix can be given before pt leaves today and dosage will be included on d/c summary. Son agreed to d/c at that time. RN made aware. CSW sent clinicals to [facility] and communicated with [facility] (admissions) for room and report. DIL completed paperwork at SNF. Room and report provided to RN and put in treatment team sticky note. Transportation arranged with [facility]. Son made aware that [facility] is about 1 1/2 hours behind. Signed DNR on chart. CSW is signing off as no further needs identified.  _______________________________________________ Truitt Merle, LCSW 05/28/2016, 8:00 PM

## 2016-05-17 NOTE — Discharge Summary (Signed)
[facility] Discharge Summary   Patient ID: DILLON MCREYNOLDS MRN: [id number] DOB/AGE: [birth year] 83 y.o.  Admit date: 05/08/2016 Discharge date: 05/27/2016  Admitting Diagnosis: SDH left temporal lobe Left tripod fracture  Left elbow radial head fracture   Discharge Diagnosis Patient Active Problem List   Diagnosis Date Noted  . Protein-calorie malnutrition, severe 05/16/2016  . SDH (subdural hematoma) ([place]) 05/08/2016  . Dehydration   . Diarrhea 11/21/2015  . Hypokalemia due to loss of potassium 11/21/2015  . Hypothyroidism 11/21/2015  . Hypertension 11/21/2015  . CAD (coronary artery disease) 11/21/2015  . Panic attacks 11/21/2015  . Restless legs syndrome 11/21/2015    Consultants Dr. [name]-Neurosurgery  Dr. Lovenia Shuck Dr. Veverly Fells, Orthopedics  Imaging: No results found.  Procedures None  HPI: Chemere Steffler Brooksis a 83 y.o.femalewho presented to the Emergency Department complaining of acute onset, moderate left eye pain s/p a fall which occurred 1.5 hours PTA. Pt states she was sitting in a chair when she was reaching for a glass of water and fell face forward hitting her head. Pt also had associated skin tears and bruising under her left eye. Relative also reported left jaw swelling. No alleviating factors noted. Pt denied LOC, abdominal pain or any other associated symptoms. She remembers the event. She has been functioning independently up until this point. She does have a history of falls in the past. Pt states she is on coumadin for a. fib and notes her last INR was therapeutic 2 weeks ago. Wynonia Lawman is cardiologist. INR is 2.75 on arrival.   Hospital Course:  In the ED, workup showed subdural hematoma of left temporal lobe, left tripod fracture, left radial head fracture and suspected right rib fracture. Pt was admitted to the floor to the trauma service. Neurosurgery was consulted and there was no indication for surgical intervention for her SDH.  Coumadin was stopped and reversed with Kcentra. ENT was consulted and there was no indication for surgical intervention. Hospital day (HD) #1 the pt was moved to a stepdown unit for respiratory distress. Repeat head CT revealed no evidence of any enlarged hemorrhage. On HD #3 pt was moved to the floor. Pt respiratory status improved.  On HD #5 pt was noted to have left arm pain.  Minimally displaced left radial head and neck fracture was noted on xray. Orthopedics was consulted and recommended sling for 6-8 weeks and no surgical intervention. Pt had pain in LLE, doppler US revealed no DVT. Diet was advanced as tolerated. On HD#9 the patient was voiding well, tolerating diet, ambulating well, pain well controlled, vital signs stable, and felt stable for discharge to [facility] in [place]. Pt was started on Eliquis prior to discharge.  The patient was discharged in good condition.  The [place] substance control database was reviewed prior to prescribing this patient pain medication.  I did not see nor take part in this patient's care. All of the information provided in this discharge summary was taken from the pt's EMR.   Allergies as of 05/31/2016      Reactions   Cefdinir Diarrhea   Codeine Other (See Comments)   nervous      Medication List    STOP taking these medications   azithromycin 500 MG tablet Commonly known as:  ZITHROMAX   fentaNYL 75 MCG/HR Commonly known as:  DURAGESIC - dosed mcg/hr Replaced by:  fentaNYL 50 MCG/HR   HYDROcodone-acetaminophen 10-325 MG tablet Commonly known as:  NORCO Replaced by:  HYDROcodone-acetaminophen 5-325 MG tablet  senna-docusate 8.6-50 MG tablet Commonly known as:  Senokot-S   warfarin 2.5 MG tablet Commonly known as:  COUMADIN     TAKE these medications   acetaminophen 325 MG tablet Commonly known as:  TYLENOL Take 1 tablet (325 mg total) by mouth every 6 (six) hours as needed for mild pain.   albuterol 108 (90 Base) MCG/ACT  inhaler Commonly known as:  PROVENTIL HFA;VENTOLIN HFA Inhale 2 puffs into the lungs every 6 (six) hours as needed for wheezing or shortness of breath.   ALPRAZolam 0.5 MG tablet Commonly known as:  XANAX Take 1 tablet (0.5 mg total) by mouth every morning. What changed:  how much to take  when to take this  additional instructions   apixaban 2.5 MG Tabs tablet Commonly known as:  ELIQUIS Take 1 tablet (2.5 mg total) by mouth 2 (two) times daily.   aspirin EC 81 MG tablet Take 81 mg by mouth daily.   CARTIA XT 240 MG 24 hr capsule Generic drug:  diltiazem Take 240 mg by mouth daily.   docusate sodium 100 MG capsule Commonly known as:  COLACE Take 1 capsule (100 mg total) by mouth 2 (two) times daily.   DULoxetine 30 MG capsule Commonly known as:  CYMBALTA Take 30 mg by mouth 3 (three) times daily.   fentaNYL 50 MCG/HR Commonly known as:  DURAGESIC - dosed mcg/hr Place 1 patch (50 mcg total) onto the skin every 3 (three) days. Start taking on:  05/18/2016 Replaces:  fentaNYL 75 MCG/HR   furosemide 80 MG tablet Commonly known as:  LASIX Take 80 mg by mouth daily as needed for fluid or edema (swelling).   HYDROcodone-acetaminophen 5-325 MG tablet Commonly known as:  NORCO/VICODIN Take 1 tablet by mouth every 6 (six) hours as needed for moderate pain. Replaces:  HYDROcodone-acetaminophen 10-325 MG tablet   lactose free nutrition Liqd Take 237 mLs by mouth 2 (two) times daily between meals.   levothyroxine 137 MCG tablet Commonly known as:  SYNTHROID, LEVOTHROID Take 137 mcg by mouth daily before breakfast.   magnesium chloride 64 MG Tbec SR tablet Commonly known as:  SLOW-MAG Take 1 tablet (64 mg total) by mouth daily.   ondansetron 4 MG tablet Commonly known as:  ZOFRAN Take 1 tablet (4 mg total) by mouth every 6 (six) hours as needed for nausea.   polyethylene glycol packet Commonly known as:  MIRALAX / GLYCOLAX Take 17 g by mouth daily.   potassium  chloride SA 15 MEQ tablet Commonly known as:  KLOR-CON M15 Take 2 tablets (30 mEq total) by mouth daily. USE WHEN TAKING LASIX What changed:  when to take this  reasons to take this  additional instructions   saccharomyces boulardii 250 MG capsule Commonly known as:  FLORASTOR Take 250 mg by mouth 2 (two) times daily.   tamsulosin 0.4 MG Caps capsule Commonly known as:  FLOMAX Take 0.4 mg by mouth daily.         Contact information for follow-up providers    Augustin Schooling, MD. Call.   Specialty:  Orthopedic Surgery Why:  as needed Contact information: [address] [place] [address] [phone]        Earleen Newport, MD. Schedule an appointment as soon as possible for a visit in 2 week(s).   Specialty:  Neurosurgery Why:  for follow up Contact information: [address] [place] [address] [phone]  Contact information for after-discharge care    Destination    [facility] .   Specialty:  Skilled Nursing Facility Contact information: [address] [place] [phone]                  Signed: [facility] 06/05/2016, 2:55 PM Pager: [phone] Consults: [phone] Mon-Fri 7:00 am-4:30 pm Sat-Sun 7:00 am-11:30 am

## 2016-05-17 NOTE — Progress Notes (Signed)
[facility] Progress Note     Subjective:  Leah [name] is an 83 year old female with a PMHx including CAD, HTN, hypothyroidism, anxiety. She has a history of falls in the past.   She presented to the ED on 05/08/16 following a fall in her home. She was subsequently diagnosed with a SDH at the left temporal lobe, left tripod fracture that is stable. A repeat CT scan performed on 3/27 shows a stable bleed. She has not experienced any acute changes in the meantime. She also sustained a left elbow radial head fracture and was placed in a sling for 6-8 weeks with orthopedic follow up.  The patient is unable to respond to any questions. She is alert and can intermittently track with eye movement. She is resting in bed. She is still on oxygen supplementation.  Per nursing: There were no significant events overnight. She continues to have poor PO intake. She was given a fleets enema without a successful BM yesterday.   ROS unable to perform due to patient's responsiveness.   Objective: Vital signs in last 24 hours: Temp:  [97.8 F (36.6 C)-100.4 F (38 C)] 99.5 F (37.5 C) (04/04 0600) Pulse Rate:  [92-117] 117 (04/04 0550) Resp:  [20-24] 20 (04/04 0550) BP: (158-182)/(83-114) 182/91 (04/04 0550) SpO2:  [96 %-99 %] 98 % (04/04 0550) Last BM Date: 05/14/16  Intake/Output from previous day: 04/03 0701 - 04/04 0700 In: 929.2 [I.V.:929.2] Out: -  Intake/Output this shift: No intake/output data recorded.  PE:  Gen:  Alert, moderate level of eye contact, NAD, resting in bed with nasal cannula HEENT: PERRL Card:  Irregular rhythm and rate noted. No murmurs gallops or rubs. Radial and pedal pulses noted. Pulm:  Non-labored, clear to auscultation bilaterally Abd: Soft, non-tender, non-distended, hypoactive bowel sounds. Ext: No edema. Bilateral wrists have bandage wrappings.    Lab Results:  No results for input(s): WBC, HGB, HCT, PLT in the last 72 hours. BMET  Recent  Labs  05/16/16 0455  NA 144  K 3.1*  CL 104  CO2 31  GLUCOSE 116*  BUN 15  CREATININE 0.65  CALCIUM 11.6*   PT/INR No results for input(s): LABPROT, INR in the last 72 hours. CMP     Component Value Date/Time   NA 144 05/16/2016 0455   K 3.1 (L) 05/16/2016 0455   CL 104 05/16/2016 0455   CO2 31 05/16/2016 0455   GLUCOSE 116 (H) 05/16/2016 0455   BUN 15 05/16/2016 0455   CREATININE 0.65 05/16/2016 0455   CALCIUM 11.6 (H) 05/16/2016 0455   CALCIUM 10.0 09/15/2010 0530   PROT 6.1 (L) 12/27/2015 0959   ALBUMIN 3.3 (L) 12/27/2015 0959   AST 16 12/27/2015 0959   ALT 10 (L) 12/27/2015 0959   ALKPHOS 112 12/27/2015 0959   BILITOT 0.9 12/27/2015 0959   GFRNONAA >60 05/16/2016 0455   GFRAA >60 05/16/2016 0455    Anti-infectives: Anti-infectives    None       Assessment/Plan  Fall, ground level  SDH left temporal lobe- [name], neurologically intact; repeat CT 3/27 stable Left tripod fractureWilburn Cornelia, MD; non-op; elevate, ice, liquid-soft diet, saline nasal spray  A.fib, Elevated INR- hold coumadin and do not resume, s/p reversal with Kcentra. Continue home meds including cardizem Hypothyroidism - Continue synthroid  Anxiety - home medications (xanax, Cymbalta)  Acute respiratory distress - improving, O2 stable on RA Suspected right rib fractures - IS/pulmonary toilet Left elbow radial head fracture - appreciate ortho consult,  per Dr. Veverly Fells, nonop, sling x6-8 weeks, f/u ortho PRN FEN-dysphagia 2 diet, fleet enema unsuccessful - will reorder soap suds enema; Hypokalemic - given IV K will recheck BUN VTE:SCD's Pain: Tylenol PRN, fentanyl patch 75 mcg (home med) Dispo: SNF   CSW coordinating SNF placement with her sons. Her family is attempting to find placement at a local facility and will contact CSW when they have decided on a place. Her d/c paperwork has been completed.   LOS: 9 days    Roselle Locus , PA-S   Patient is very lethargic this Am.  Still has  on Duragesic patch which I have discontinued today.  Getting Xanax bid.  Heart rate going up into the 140's but patient not able to take oral Cardizem.  She needs disposition ins SNF   This patient has been seen and I agree with the findings and treatment plan.  Kathryne Eriksson. Dahlia Bailiff, MD, [place] [phone] (pager) [phone] (direct pager) Trauma Surgeon

## 2016-05-17 NOTE — Progress Notes (Signed)
Md notified of pt elevated HR. Medication administered this am. Pt with no noted distress. Will continue to monitor.

## 2016-05-17 NOTE — Progress Notes (Signed)
Pt discharged at this time taking all personal belongings. Son at bedside. Pt transported via [facility] to skilled nursing facility. Report called in to nurse, Lindon Romp at [facility]. Pt with no noted distress.

## 2016-05-17 NOTE — Progress Notes (Signed)
Initial Nutrition Assessment  DOCUMENTATION CODES:   Severe malnutrition in context of chronic illness  INTERVENTION:    Boost Plus po BID, each supplement provides 360 kcal and 14 grams of protein  NUTRITION DIAGNOSIS:   Malnutrition (Severe ) related to chronic illness (CAD) as evidenced by severe depletion of muscle mass, severe depletion of body fat   GOAL:   Patient will meet greater than or equal to 90% of their needs  MONITOR:   PO intake, Supplement acceptance, Labs, Weight trends, I & O's  REASON FOR ASSESSMENT:   Low Braden  ASSESSMENT:   83 yo Female fell from standing onto face while reaching for glass of water. Left face hurts.  She remembers event.  She does have history of falls in past.  On coumadin for afib- Wynonia Lawman is cardiologist.  INR is 2.75 on arrival.    RD spoke with pt's son at bedside. Report pt typically consumes 2 meals at home.  Lunch meal untouched on tray table. Has Ensure Enlive ordered, however, doesn't like.  Likes Boost supplements. S/p bedside swallow evaluation per Speech Path 3/28. Medications reviewed and include FLORASTOR. Labs reviewed.  Potassium 3.1 (L).  Nutrition-Focused physical exam completed. Findings are severe fat depletion, severe muscle depletion, and no edema.   Son with several questions regarding pt's current medical state and medications including Xanax and Coumadin.  RN notified.   Diet Order:  DIET DYS 2 Room service appropriate? Yes; Fluid consistency: Thin  Skin:  Reviewed, no issues  Last BM:  4/1  Height:   Ht Readings from Last 1 Encounters:  05/09/16 5' (1.524 m)    Weight:   Wt Readings from Last 1 Encounters:  05/09/16 132 lb 4.4 oz (60 kg)    Ideal Body Weight:  45.4 kg  BMI:  Body mass index is 25.83 kg/m.  Estimated Nutritional Needs:   Kcal:  1200-1400  Protein:  55-65 gm  Fluid:  >/= 1.5 L  EDUCATION NEEDS:   No education needs identified at this time  Arthur Holms,  RD, LDN Pager #: [phone] After-Hours Pager #: [phone]

## 2016-06-13 DIAGNOSIS — 419620001 Death: Secondary | SNOMED CT | POA: Diagnosis not present

## 2016-06-13 DEATH — deceased

## 2018-01-15 IMAGING — DX DG HUMERUS 2V *L*
3 series · 3 of 3 positions shown · non-contrast
Comparison: 04/14/2015

CLINICAL DATA: Left shoulder and forearm pain, patient has bruising
and tenderness when palpating left arm. Little range of motion. Best
obtainable due to patients pain and condition.

EXAM:
LEFT HUMERUS - 2+ VIEW

[humerus ap (1 of 2)]
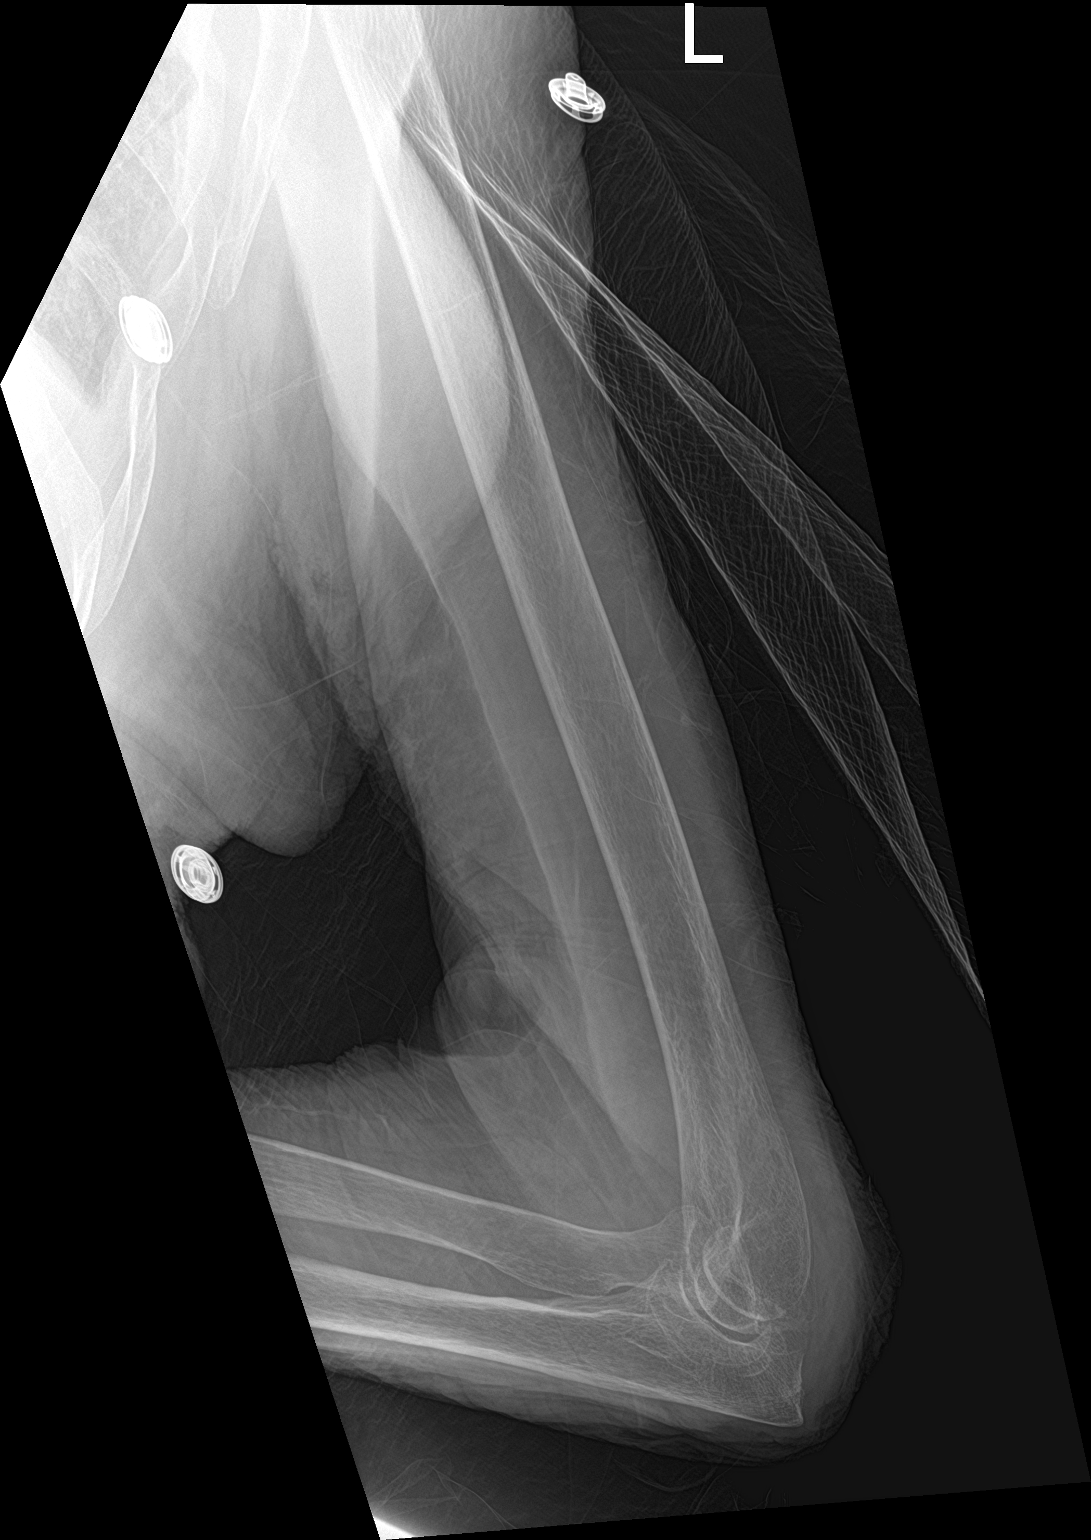

[humerus lat]
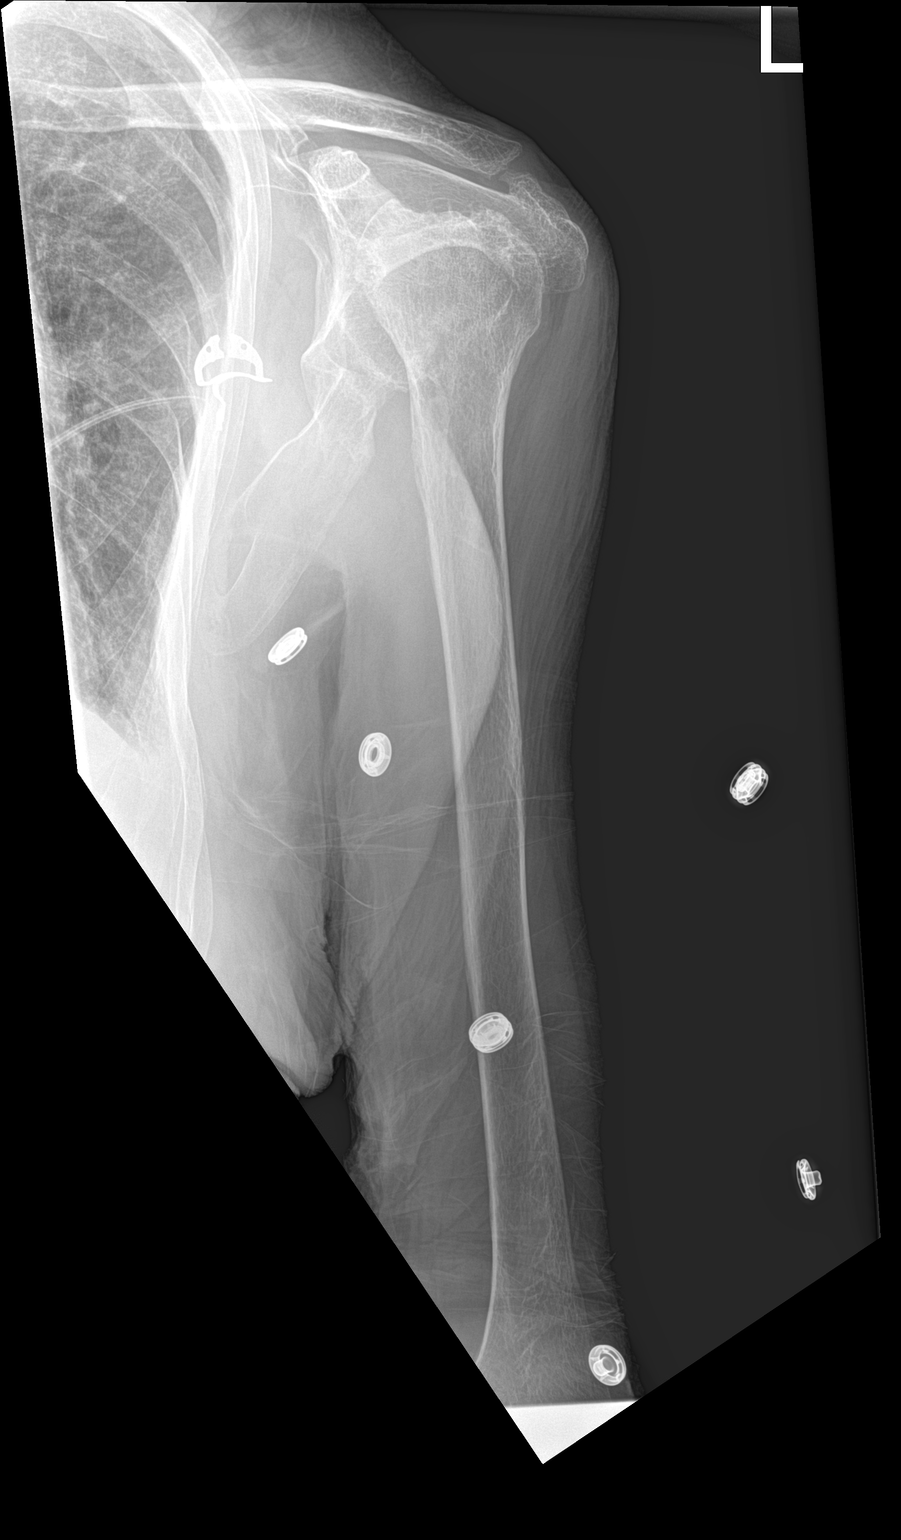

[humerus ap (2 of 2)]
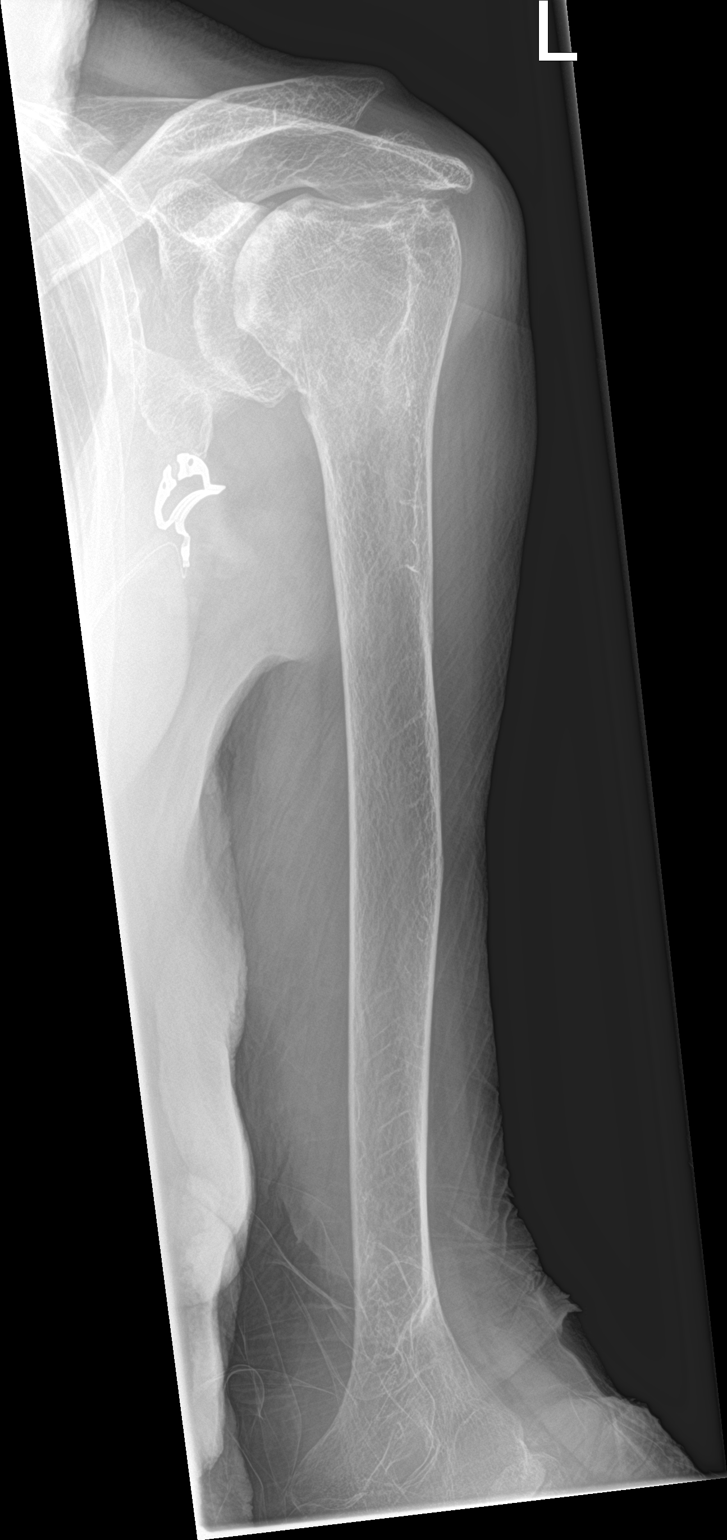

[3 of 3 positions shown; findings below may reference images not displayed]

FINDINGS: Diffuse osteopenia. High-riding humeral head with progressive
Subchondral sclerosis and remodeling. No fracture or dislocation.
IMPRESSION: 1. Osteopenia without fracture or dislocation.
2. Progressive   Degenerative changes in the shoulder .
